# Patient Record
Sex: Female | Born: 1950 | Race: Black or African American | Hispanic: No | Marital: Married | Smoking: Never smoker
Health system: Southern US, Community
[De-identification: ages and names within clinical notes are randomized; demographics above are authoritative.]

## PROBLEM LIST (undated history)

## (undated) DIAGNOSIS — M1990 Unspecified osteoarthritis, unspecified site: Secondary | ICD-10-CM

## (undated) DIAGNOSIS — E0590 Thyrotoxicosis, unspecified without thyrotoxic crisis or storm: Secondary | ICD-10-CM

## (undated) DIAGNOSIS — I10 Essential (primary) hypertension: Secondary | ICD-10-CM

## (undated) DIAGNOSIS — E079 Disorder of thyroid, unspecified: Secondary | ICD-10-CM

## (undated) DIAGNOSIS — I5189 Other ill-defined heart diseases: Secondary | ICD-10-CM

## (undated) DIAGNOSIS — K219 Gastro-esophageal reflux disease without esophagitis: Secondary | ICD-10-CM

## (undated) HISTORY — DX: Other ill-defined heart diseases: I51.89

## (undated) HISTORY — PX: COLONOSCOPY: SHX174

## (undated) HISTORY — PX: BREAST CYST EXCISION: SHX579

## (undated) HISTORY — DX: Thyrotoxicosis, unspecified without thyrotoxic crisis or storm: E05.90

## (undated) HISTORY — DX: Essential (primary) hypertension: I10

## (undated) HISTORY — DX: Gastro-esophageal reflux disease without esophagitis: K21.9

## (undated) HISTORY — PX: BREAST BIOPSY: SHX20

## (undated) HISTORY — DX: Unspecified osteoarthritis, unspecified site: M19.90

## (undated) HISTORY — PX: OOPHORECTOMY: SHX86

## (undated) HISTORY — DX: Disorder of thyroid, unspecified: E07.9

## (undated) HISTORY — PX: ABDOMINAL HYSTERECTOMY: SHX81

---

## 2008-04-07 ENCOUNTER — Ambulatory Visit (HOSPITAL_BASED_OUTPATIENT_CLINIC_OR_DEPARTMENT_OTHER): Admission: RE | Admit: 2008-04-07 | Discharge: 2008-04-07 | Payer: Self-pay | Admitting: Internal Medicine

## 2008-08-01 ENCOUNTER — Emergency Department (HOSPITAL_BASED_OUTPATIENT_CLINIC_OR_DEPARTMENT_OTHER): Admission: EM | Admit: 2008-08-01 | Discharge: 2008-08-01 | Payer: Self-pay | Admitting: Emergency Medicine

## 2011-06-01 LAB — CULTURE, ROUTINE-ABSCESS

## 2019-07-01 ENCOUNTER — Other Ambulatory Visit: Payer: Self-pay

## 2019-07-01 ENCOUNTER — Encounter (HOSPITAL_BASED_OUTPATIENT_CLINIC_OR_DEPARTMENT_OTHER): Payer: Self-pay

## 2019-07-01 ENCOUNTER — Emergency Department (HOSPITAL_BASED_OUTPATIENT_CLINIC_OR_DEPARTMENT_OTHER): Payer: Medicare Other

## 2019-07-01 ENCOUNTER — Inpatient Hospital Stay (HOSPITAL_BASED_OUTPATIENT_CLINIC_OR_DEPARTMENT_OTHER)
Admission: EM | Admit: 2019-07-01 | Discharge: 2019-07-03 | DRG: 299 | Disposition: A | Discharge status: Home / Self Care | Payer: Medicare Other | Attending: Internal Medicine | Admitting: Internal Medicine

## 2019-07-01 DIAGNOSIS — Z6835 Body mass index (BMI) 35.0-35.9, adult: Secondary | ICD-10-CM | POA: Diagnosis not present

## 2019-07-01 DIAGNOSIS — I2510 Atherosclerotic heart disease of native coronary artery without angina pectoris: Secondary | ICD-10-CM | POA: Diagnosis present

## 2019-07-01 DIAGNOSIS — E669 Obesity, unspecified: Secondary | ICD-10-CM | POA: Diagnosis present

## 2019-07-01 DIAGNOSIS — D539 Nutritional anemia, unspecified: Secondary | ICD-10-CM | POA: Diagnosis present

## 2019-07-01 DIAGNOSIS — I2699 Other pulmonary embolism without acute cor pulmonale: Secondary | ICD-10-CM | POA: Diagnosis not present

## 2019-07-01 DIAGNOSIS — R739 Hyperglycemia, unspecified: Secondary | ICD-10-CM | POA: Diagnosis present

## 2019-07-01 DIAGNOSIS — I2693 Single subsegmental pulmonary embolism without acute cor pulmonale: Secondary | ICD-10-CM

## 2019-07-01 DIAGNOSIS — I82431 Acute embolism and thrombosis of right popliteal vein: Principal | ICD-10-CM | POA: Diagnosis present

## 2019-07-01 DIAGNOSIS — I82441 Acute embolism and thrombosis of right tibial vein: Secondary | ICD-10-CM | POA: Diagnosis present

## 2019-07-01 DIAGNOSIS — Z20828 Contact with and (suspected) exposure to other viral communicable diseases: Secondary | ICD-10-CM | POA: Diagnosis present

## 2019-07-01 DIAGNOSIS — D649 Anemia, unspecified: Secondary | ICD-10-CM | POA: Diagnosis not present

## 2019-07-01 DIAGNOSIS — I82409 Acute embolism and thrombosis of unspecified deep veins of unspecified lower extremity: Secondary | ICD-10-CM

## 2019-07-01 DIAGNOSIS — Z86718 Personal history of other venous thrombosis and embolism: Secondary | ICD-10-CM

## 2019-07-01 DIAGNOSIS — Z8249 Family history of ischemic heart disease and other diseases of the circulatory system: Secondary | ICD-10-CM

## 2019-07-01 DIAGNOSIS — Z86711 Personal history of pulmonary embolism: Secondary | ICD-10-CM

## 2019-07-01 DIAGNOSIS — E876 Hypokalemia: Secondary | ICD-10-CM | POA: Diagnosis present

## 2019-07-01 DIAGNOSIS — I5042 Chronic combined systolic (congestive) and diastolic (congestive) heart failure: Secondary | ICD-10-CM | POA: Diagnosis present

## 2019-07-01 HISTORY — DX: Acute embolism and thrombosis of unspecified deep veins of unspecified lower extremity: I82.409

## 2019-07-01 HISTORY — DX: Single subsegmental thrombotic pulmonary embolism without acute cor pulmonale: I26.93

## 2019-07-01 HISTORY — DX: Personal history of other venous thrombosis and embolism: Z86.718

## 2019-07-01 HISTORY — DX: Acute embolism and thrombosis of right popliteal vein: I82.431

## 2019-07-01 LAB — TROPONIN I (HIGH SENSITIVITY)
Troponin I (High Sensitivity): <2 ng/L (ref <18)
Troponin I (High Sensitivity): <2 ng/L (ref <18)

## 2019-07-01 LAB — CBC WITH DIFFERENTIAL/PLATELET
Basophils Absolute: 0 10*3/uL (ref 0.0–0.1)
Basophils Relative: 0 %
Eosinophils Absolute: 0.2 10*3/uL (ref 0.0–0.5)
Eosinophils Relative: 3 %
HCT: 34.2 % — ABNORMAL LOW (ref 36.0–46.0)
Hemoglobin: 11 g/dL — ABNORMAL LOW (ref 12.0–15.0)
Lymphocytes Relative: 27 %
Lymphs Abs: 2.5 10*3/uL (ref 0.7–4.0)
MCHC: 32.3 g/dL (ref 30.0–36.0)
MCV: 100.3 fL — ABNORMAL HIGH (ref 80.0–100.0)
Monocytes Absolute: 0.5 10*3/uL (ref 0.1–1.0)
Monocytes Relative: 6 %
Neutro Abs: 5.9 10*3/uL (ref 1.7–7.7)
Neutrophils Relative %: 64 %
Platelets: 310 10*3/uL (ref 150–400)
RBC: 3.41 MIL/uL — ABNORMAL LOW (ref 3.87–5.11)
RDW: 14.6 % (ref 11.5–15.5)
WBC: 9.3 10*3/uL (ref 4.0–10.5)

## 2019-07-01 LAB — BASIC METABOLIC PANEL
Anion gap: 9 (ref 5–15)
BUN: 14 mg/dL (ref 8–23)
CO2: 25 mmol/L (ref 22–32)
Calcium: 9.3 mg/dL (ref 8.9–10.3)
Chloride: 105 mmol/L (ref 98–111)
Creatinine, Ser: 0.73 mg/dL (ref 0.44–1.00)
GFR calc Af Amer: >60 mL/min (ref >60)
GFR calc non Af Amer: >60 mL/min (ref >60)
Glucose, Bld: 105 mg/dL — ABNORMAL HIGH (ref 70–99)
Potassium: 3.2 mmol/L — ABNORMAL LOW (ref 3.5–5.1)
Sodium: 139 mmol/L (ref 135–145)

## 2019-07-01 LAB — BRAIN NATRIURETIC PEPTIDE: B Natriuretic Peptide: 74.6 pg/mL (ref 0.0–100.0)

## 2019-07-01 MED ORDER — IOHEXOL 300 MG/ML  SOLN: 100.0000 mL | Freq: Once | INTRAMUSCULAR | Status: DC | PRN

## 2019-07-01 MED ORDER — HEPARIN BOLUS VIA INFUSION
5000.0000 [IU] | Freq: Once | INTRAVENOUS | Status: AC
  Administered 2019-07-01: 5000 [IU] via INTRAVENOUS

## 2019-07-01 MED ORDER — IOHEXOL 350 MG/ML SOLN
100.0000 mL | Freq: Once | INTRAVENOUS | Status: AC | PRN
  Administered 2019-07-01: 100 mL via INTRAVENOUS

## 2019-07-01 MED ORDER — RIVAROXABAN (XARELTO) VTE STARTER PACK (15 & 20 MG): ORAL_TABLET | ORAL | 0 refills | Status: DC

## 2019-07-01 MED ORDER — HEPARIN (PORCINE) 25000 UT/250ML-% IV SOLN
1200.0000 [IU]/h | INTRAVENOUS | Status: DC
  Administered 2019-07-01: 21:00:00 1200 [IU]/h via INTRAVENOUS
  Filled 2019-07-01: qty 250

## 2019-07-01 MED ORDER — RIVAROXABAN 15 MG PO TABS
15.0000 mg | ORAL_TABLET | Freq: Once | ORAL | Status: DC
  Filled 2019-07-01: qty 1

## 2019-07-01 NOTE — ED Notes (Signed)
Pt provided gown and asked to change for exam

## 2019-07-01 NOTE — Progress Notes (Signed)
ANTICOAGULATION CONSULT NOTE   Pharmacy Consult for Heparin  Indication: pulmonary embolus  No Known Allergies  Patient Measurements: Height: 5\' 2"  (157.5 cm) Weight: 195 lb (88.5 kg) IBW/kg (Calculated) : 50.1 Heparin Dosing Weight: 70.6 kg  Vital Signs: Temp: 98.3 F (36.8 C) (11/04 1540) Temp Source: Oral (11/04 1540) BP: 137/81 (11/04 1838) Pulse Rate: 95 (11/04 1838)  Labs: Recent Labs    07/01/19 1834  HGB 11.0*  HCT 34.2*  PLT 310  CREATININE 0.73  TROPONINIHS <2    Estimated Creatinine Clearance: 68.6 mL/min (by C-G formula based on SCr of 0.73 mg/dL).   Medical History: History reviewed. No pertinent past medical history.  Medications:  Scheduled:   Assessment: Patient os a 58 yof that presented to the ED with calf pain and denied SOB and CP. The patient was found to have a PE and pharmacy has been consulted to dose heparin for this patient. Patient denies any previous DVT/PA and does not appear to be on any blood thinners at this time.   Goal of Therapy:  Heparin level 0.3-0.7 units/ml Monitor platelets by anticoagulation protocol: Yes   Plan:  - Heparin 5000 unit IV x 1 dose  - Heparin drip @ 1200 units/hr - Heparin level in 6 hours  - Monitor for s/s of bleeding and CBC while on heparin drip  Duanne Limerick PharmD, BCPS 07/01/2019,8:52 PM

## 2019-07-01 NOTE — H&P (Signed)
TRH H&P    Patient Demographics:    Ruth Jensen, is a 68 y.o. female  MRN: [id number]  DOB - September 30, 1950  Admit Date - 07/01/2019  Referring MD/NP/PA:  Tyrone Nine  Outpatient Primary MD for the patient is Patient, No Pcp Per  Patient coming from:  home  Chief complaint-  Right leg pain   HPI:    Ruth Jensen  is a 68 y.o. female, woke up Sunday and her calf on her right leg hurt, and used heating pad and didn't seem to help much and because not improving and thus went to ed.  Pt denies weight loss, cancer, long trip, or family hx of blood clots  In Ed, pt noted to be tachycardic, .  T 98.3, P 105, R 18, Bp 153/81  Pox 100% on RA Wt 88.5kg  Ultrasound IMPRESSION: Sonographic survey of the right lower extremity positive for acute DVT of the popliteal vein extending into the posterior tibial vein.  CTA chest IMPRESSION: CT is positive for acute pulmonary emboli of the left lower lobe segmental and subsegmental vasculature.  Coronary artery disease.  Wbc 9.3, Hgb 11.0, Plt 310 Na 139, K 3.2, Bun 14, Creatinine 0.73 Trop <2 BNP 74.6,   Pt will be admitted for RLE DVT and acute PE   Review of systems:    In addition to the HPI above,  No Fever-chills, No Headache, No changes with Vision or hearing, No problems swallowing food or Liquids, No Chest pain, Cough or Shortness of Breath, No Abdominal pain, No Nausea or Vomiting, bowel movements are regular, No Blood in stool or Urine, No dysuria, No new skin rashes or bruises, No new joints pains-aches,  No new weakness, tingling, numbness in any extremity, No recent weight gain or loss, No polyuria, polydypsia or polyphagia, No significant Mental Stressors.  All other systems reviewed and are negative.    Past History of the following :    Past Medical History:  Diagnosis Date   DVT (deep venous thrombosis) ([place]) 07/01/2019   Pulmonary  embolism ([place]) 07/02/2019      Past Surgical History:  Procedure Laterality Date   ABDOMINAL HYSTERECTOMY        Social History:      Social History   Tobacco Use   Smoking status: Never Smoker   Smokeless tobacco: Never Used  Substance Use Topics   Alcohol use: Not Currently       Family History :     Family History  Problem Relation Age of Onset   Hypertension Mother    Hypertension Father        Home Medications:   Prior to Admission medications   Medication Sig Start Date End Date Taking? Authorizing Provider  Rivaroxaban 15 & 20 MG TBPK Follow package directions: Take one 15mg  tablet by mouth twice a day. On day 22, switch to one 20mg  tablet once a day. Take with food. 07/01/19   Deno Etienne, DO     Allergies:    No Known Allergies   Physical Exam:  Vitals  Blood pressure (!) 156/92, pulse 96, temperature 98.7 F (37.1 C), resp. rate 18, height 5\' 2"  (1.575 m), weight 87.7 kg, SpO2 100 %.  1.  General: axoxo3  2. Psychiatric: euthymic  3. Neurologic: Cn 2-12 intact, reflexes 2+ symmetric, diffuse with no clonus, motor 5/5 in all 4 ext  4. HEENMT:  Anicteric, pupils 1.24mm symmetric, direct, consensual, near intact Neck: no jvd  5. Respiratory : CTAB  6. Cardiovascular : rrr s1, s2, no m/g/r,  No loud p2  7. Gastrointestinal:  Abd: soft, nt, nd, +bs  8. Skin:  Ext: no c/c,  Trace edema, right lower ext, + homans  9.Musculoskeletal:  Good ROM    Data Review:    CBC Recent Labs  Lab 07/01/19 1834  WBC 9.3  HGB 11.0*  HCT 34.2*  PLT 310  MCV 100.3*  MCHC 32.3  RDW 14.6  LYMPHSABS 2.5  MONOABS 0.5  EOSABS 0.2  BASOSABS 0.0   ------------------------------------------------------------------------------------------------------------------  Results for orders placed or performed during the hospital encounter of 07/01/19 (from the past 48 hour(s))  CBC with Differential     Status: Abnormal   Collection Time:  07/01/19  6:34 PM  Result Value Ref Range   WBC 9.3 4.0 - 10.5 K/uL   RBC 3.41 (L) 3.87 - 5.11 MIL/uL   Hemoglobin 11.0 (L) 12.0 - 15.0 g/dL   HCT 34.2 (L) 36.0 - 46.0 %   MCV 100.3 (H) 80.0 - 100.0 fL   MCHC 32.3 30.0 - 36.0 g/dL   RDW 14.6 11.5 - 15.5 %   Platelets 310 150 - 400 K/uL   Neutrophils Relative % 64 %   Neutro Abs 5.9 1.7 - 7.7 K/uL   Lymphocytes Relative 27 %   Lymphs Abs 2.5 0.7 - 4.0 K/uL   Monocytes Relative 6 %   Monocytes Absolute 0.5 0.1 - 1.0 K/uL   Eosinophils Relative 3 %   Eosinophils Absolute 0.2 0.0 - 0.5 K/uL   Basophils Relative 0 %   Basophils Absolute 0.0 0.0 - 0.1 K/uL    Comment: Performed at [facility], [address] [place], [place] [id number]  Basic metabolic panel     Status: Abnormal   Collection Time: 07/01/19  6:34 PM  Result Value Ref Range   Sodium 139 135 - 145 mmol/L   Potassium 3.2 (L) 3.5 - 5.1 mmol/L   Chloride 105 98 - 111 mmol/L   CO2 25 22 - 32 mmol/L   Glucose, Bld 105 (H) 70 - 99 mg/dL   BUN 14 8 - 23 mg/dL   Creatinine, Ser 0.73 0.44 - 1.00 mg/dL   Calcium 9.3 8.9 - 10.3 mg/dL   GFR calc non Af Amer >60 >60 mL/min   GFR calc Af Amer >60 >60 mL/min   Anion gap 9 5 - 15    Comment: Performed at [facility], [address] [place], [place] [address]  Troponin I (High Sensitivity)     Status: None   Collection Time: 07/01/19  6:34 PM  Result Value Ref Range   Troponin I (High Sensitivity) <2 <18 ng/L    Comment: (NOTE) Elevated high sensitivity troponin I (hsTnI) values and significant  changes across serial measurements may suggest ACS but many other  chronic and acute conditions are known to elevate hsTnI results.  Refer to the Links section for chest pain algorithms and additional  guidance. Performed at [facility], [address] [place], [place] [address]   Brain natriuretic peptide     Status: None   Collection Time: 07/01/19  6:34 PM  Result Value Ref Range   B  Natriuretic Peptide 74.6 0.0 - 100.0 pg/mL    Comment: Performed at [facility], [address] [place], [place] [address]  Troponin I (High Sensitivity)     Status: None   Collection Time: 07/01/19  8:20 PM  Result Value Ref Range   Troponin I (High Sensitivity) <2 <18 ng/L    Comment: (NOTE) Elevated high sensitivity troponin I (hsTnI) values and significant  changes across serial measurements may suggest ACS but many other  chronic and acute conditions are known to elevate hsTnI results.  Refer to the Links section for chest pain algorithms and additional  guidance. Performed at [facility], [address] [place], [place] [address]     Chemistries  Recent Labs  Lab 07/01/19 1834  NA 139  K 3.2*  CL 105  CO2 25  GLUCOSE 105*  BUN 14  CREATININE 0.73  CALCIUM 9.3   ------------------------------------------------------------------------------------------------------------------  ------------------------------------------------------------------------------------------------------------------ GFR: Estimated Creatinine Clearance: 68.2 mL/min (by C-G formula based on SCr of 0.73 mg/dL). Liver Function Tests: No results for input(s): AST, ALT, ALKPHOS, BILITOT, PROT, ALBUMIN in the last 168 hours. No results for input(s): LIPASE, AMYLASE in the last 168 hours. No results for input(s): AMMONIA in the last 168 hours. Coagulation Profile: No results for input(s): INR, PROTIME in the last 168 hours. Cardiac Enzymes: No results for input(s): CKTOTAL, CKMB, CKMBINDEX, TROPONINI in the last 168 hours. BNP (last 3 results) No results for input(s): PROBNP in the last 8760 hours. HbA1C: No results for input(s): HGBA1C in the last 72 hours. CBG: No results for input(s): GLUCAP in the last 168 hours. Lipid Profile: No results for input(s): CHOL, HDL, LDLCALC, TRIG, CHOLHDL, LDLDIRECT in the last 72 hours. Thyroid Function Tests: No results for input(s):  TSH, T4TOTAL, FREET4, T3FREE, THYROIDAB in the last 72 hours. Anemia Panel: No results for input(s): VITAMINB12, FOLATE, FERRITIN, TIBC, IRON, RETICCTPCT in the last 72 hours.  --------------------------------------------------------------------------------------------------------------- Urine analysis: No results found for: COLORURINE, APPEARANCEUR, LABSPEC, PHURINE, GLUCOSEU, HGBUR, BILIRUBINUR, KETONESUR, PROTEINUR, UROBILINOGEN, NITRITE, LEUKOCYTESUR    Imaging Results:    Ct Angio Chest Pe W And/or Wo Contrast  Result Date: 07/01/2019 CLINICAL DATA:  68 year old female with a history ofa leg pain and concern for pulmonary embolism EXAM: CT ANGIOGRAPHY CHEST WITH CONTRAST TECHNIQUE: Multidetector CT imaging of the chest was performed using the standard protocol during bolus administration of intravenous contrast. Multiplanar CT image reconstructions and MIPs were obtained to evaluate the vascular anatomy. CONTRAST:  175mL OMNIPAQUE IOHEXOL 350 MG/ML SOLN COMPARISON:  None. FINDINGS: Cardiovascular: Heart: No cardiomegaly. No pericardial fluid/thickening. Calcifications of the right coronary artery. Right ventricle left ventricle ratio below 1, estimated 0.94 Aorta: Unremarkable course, caliber, contour of the thoracic aorta. No aneurysm or dissection flap. No periaortic fluid. Pulmonary arteries: Central filling defects in the segmental and subsegmental left-sided pulmonary arteries of the left lower lobe. No filling defects identified on the right or within the left upper lobe. Mediastinum/Nodes: No mediastinal adenopathy. Unremarkable appearance of the thoracic esophagus. Unremarkable thoracic inlet. Lungs/Pleura: Central airways are clear. No pleural effusion. No confluent airspace disease. No pneumothorax. Upper Abdomen: No acute. Musculoskeletal: No acute displaced fracture. Degenerative changes of the spine. Review of the MIP images confirms the above findings. IMPRESSION: CT is positive for  acute pulmonary  emboli of the left lower lobe segmental and subsegmental vasculature. Coronary artery disease. These results were called by telephone at the time of interpretation on 07/01/2019 at 8:28 pm to provider DAN FLOYD , who verbally acknowledged these results. Electronically Signed   By: Corrie Mckusick D.O.   On: 07/01/2019 20:34   US Venous Img Lower Unilateral Right  Result Date: 07/01/2019 CLINICAL DATA:  68 year old female with a history of right calf pain EXAM: RIGHT LOWER EXTREMITY VENOUS DOPPLER ULTRASOUND TECHNIQUE: Gray-scale sonography with graded compression, as well as color Doppler and duplex ultrasound were performed to evaluate the lower extremity deep venous systems from the level of the common femoral vein and including the common femoral, femoral, profunda femoral, popliteal and calf veins including the posterior tibial, peroneal and gastrocnemius veins when visible. The superficial great saphenous vein was also interrogated. Spectral Doppler was utilized to evaluate flow at rest and with distal augmentation maneuvers in the common femoral, femoral and popliteal veins. COMPARISON:  None. FINDINGS: Contralateral Common Femoral Vein: Respiratory phasicity is normal and symmetric with the symptomatic side. No evidence of thrombus. Normal compressibility. Common Femoral Vein: No evidence of thrombus. Normal compressibility, respiratory phasicity and response to augmentation. Saphenofemoral Junction: No evidence of thrombus. Normal compressibility and flow on color Doppler imaging. Profunda Femoral Vein: No evidence of thrombus. Normal compressibility and flow on color Doppler imaging. Femoral Vein: No evidence of thrombus. Normal compressibility, respiratory phasicity and response to augmentation. Popliteal Vein: Noncompressible popliteal vein with occlusive thrombus extending into the tibioperoneal vein. Calf Veins: Occlusive thrombus of the posterior tibial vein extending towards the  popliteal vein. Peroneal vein not visualized. Superficial Great Saphenous Vein: No evidence of thrombus. Normal compressibility and flow on color Doppler imaging. Other Findings:  None. IMPRESSION: Sonographic survey of the right lower extremity positive for acute DVT of the popliteal vein extending into the posterior tibial vein. Electronically Signed   By: Corrie Mckusick D.O.   On: 07/01/2019 18:17       Assessment & Plan:    Principal Problem:   Single subsegmental pulmonary embolism without acute cor pulmonale (HCC) Active Problems:   Acute deep vein thrombosis (DVT) of popliteal vein of right lower extremity (HCC)   Acute pulmonary embolism (HCC)  Acute Pulmonary embolism Acute RLE DVT (popliteal vein) Check trop I Check cardiac echo  Heparin gtt Consider transition to oral agent in am     DVT Prophylaxis-   Heparin gtt  AM Labs Ordered, also please review Full Orders  Family Communication: Admission, patients condition and plan of care including tests being ordered have been discussed with the patient  who indicate understanding and agree with the plan and Code Status.  Code Status:  FULL CODE per patient  Admission status: Observation: Based on patients clinical presentation and evaluation of above clinical data, I have made determination that patient meets criteria at this time.  Time spent in minutes : 55 minutes   Jani Gravel M.D on 07/02/2019 at 12:55 AM

## 2019-07-01 NOTE — ED Triage Notes (Signed)
Pt reports right lower leg/calf pain for 3-4 days, worse with weight bearing.

## 2019-07-01 NOTE — ED Provider Notes (Signed)
[facility] DEPARTMENT Provider Note   CSN: [id number] Arrival date & time: 07/01/19  1527     History   Chief Complaint Chief Complaint  Patient presents with   Leg Pain    HPI Ruth Jensen is a 68 y.o. female.     68 yo F with a chief complaints of right calf pain.  Going on for about 4 days now.  Denies injury.  States that the pain is sharp and radiates to the anterior aspect of the leg.  Worse with bearing weight.  No chest pain or shortness of breath.  No recent travel immobilization and surgery.  She denies history of prior PE or DVT.  The history is provided by the patient.  Leg Pain Location:  Leg Time since incident:  4 days Injury: no   Leg location:  R lower leg Pain details:    Quality:  Aching   Radiates to:  Does not radiate   Severity:  Moderate   Onset quality:  Gradual   Duration:  4 days   Timing:  Constant   Progression:  Worsening Chronicity:  New Prior injury to area:  No Relieved by:  Nothing Worsened by:  Bearing weight (palpation) Ineffective treatments:  Heat (aspirin) Associated symptoms: no fatigue and no fever     History reviewed. No pertinent past medical history.  Patient Active Problem List   Diagnosis Date Noted   Acute pulmonary embolism ([place]) 07/01/2019    History reviewed. No pertinent surgical history.   OB History   No obstetric history on file.      Home Medications    Prior to Admission medications   Medication Sig Start Date End Date Taking? Authorizing Provider  Rivaroxaban 15 & 20 MG TBPK Follow package directions: Take one 15mg  tablet by mouth twice a day. On day 22, switch to one 20mg  tablet once a day. Take with food. 07/01/19   Deno Etienne, DO    Family History History reviewed. No pertinent family history.  Social History Social History   Tobacco Use   Smoking status: Never Smoker   Smokeless tobacco: Never Used  Substance Use Topics   Alcohol use: Not on file   Drug  use: Not on file     Allergies   Patient has no known allergies.   Review of Systems Review of Systems  Constitutional: Negative for chills, fatigue and fever.  HENT: Negative for congestion and rhinorrhea.   Eyes: Negative for redness and visual disturbance.  Respiratory: Negative for shortness of breath and wheezing.   Cardiovascular: Negative for chest pain and palpitations.  Gastrointestinal: Negative for nausea and vomiting.  Genitourinary: Negative for dysuria and urgency.  Musculoskeletal: Positive for myalgias. Negative for arthralgias.  Skin: Negative for pallor and wound.  Neurological: Negative for dizziness and headaches.     Physical Exam Updated Vital Signs BP (!) 141/83    Pulse 96    Temp 98.3 F (36.8 C) (Oral)    Resp 19    Ht 5\' 2"  (1.575 m)    Wt 88.5 kg    SpO2 100%    BMI 35.67 kg/m   Physical Exam Vitals signs and nursing note reviewed.  Constitutional:      General: She is not in acute distress.    Appearance: She is well-developed. She is not diaphoretic.  HENT:     Head: Normocephalic and atraumatic.  Eyes:     Pupils: Pupils are equal, round, and reactive to  light.  Neck:     Musculoskeletal: Normal range of motion and neck supple.  Cardiovascular:     Rate and Rhythm: Normal rate and regular rhythm.     Heart sounds: No murmur. No friction rub. No gallop.   Pulmonary:     Effort: Pulmonary effort is normal.     Breath sounds: No wheezing or rales.  Abdominal:     General: There is no distension.     Palpations: Abdomen is soft.     Tenderness: There is no abdominal tenderness.  Musculoskeletal:        General: No tenderness.     Comments: Pulse motor and sensation are intact to the right lower extremity.  She has some mild tenderness with palpation of the calf.  She has some mild tenderness to the soft tissues just lateral to the tibia.  No obvious edema.  Seems symmetric to the other side.  Skin:    General: Skin is warm and dry.    Neurological:     Mental Status: She is alert and oriented to person, place, and time.  Psychiatric:        Behavior: Behavior normal.      ED Treatments / Results  Labs (all labs ordered are listed, but only abnormal results are displayed) Labs Reviewed  CBC WITH DIFFERENTIAL/PLATELET - Abnormal; Notable for the following components:      Result Value   RBC 3.41 (*)    Hemoglobin 11.0 (*)    HCT 34.2 (*)    MCV 100.3 (*)    All other components within normal limits  BASIC METABOLIC PANEL - Abnormal; Notable for the following components:   Potassium 3.2 (*)    Glucose, Bld 105 (*)    All other components within normal limits  SARS CORONAVIRUS 2 (TAT 6-24 HRS)  BRAIN NATRIURETIC PEPTIDE  HEPARIN LEVEL (UNFRACTIONATED)  CBC  TROPONIN I (HIGH SENSITIVITY)  TROPONIN I (HIGH SENSITIVITY)    EKG EKG Interpretation  Date/Time:  Wednesday July 01 2019 18:36:56 EST Ventricular Rate:  96 PR Interval:    QRS Duration: 97 QT Interval:  345 QTC Calculation: 436 R Axis:   -8 Text Interpretation: Sinus rhythm Ventricular trigeminy Abnormal R-wave progression, early transition LVH with secondary repolarization abnormality No old tracing to compare Confirmed by Deno Etienne [phone]) on 07/01/2019 6:44:31 PM   Radiology Ct Angio Chest Pe W And/or Wo Contrast  Result Date: 07/01/2019 CLINICAL DATA:  68 year old female with a history ofa leg pain and concern for pulmonary embolism EXAM: CT ANGIOGRAPHY CHEST WITH CONTRAST TECHNIQUE: Multidetector CT imaging of the chest was performed using the standard protocol during bolus administration of intravenous contrast. Multiplanar CT image reconstructions and MIPs were obtained to evaluate the vascular anatomy. CONTRAST:  158mL OMNIPAQUE IOHEXOL 350 MG/ML SOLN COMPARISON:  None. FINDINGS: Cardiovascular: Heart: No cardiomegaly. No pericardial fluid/thickening. Calcifications of the right coronary artery. Right ventricle left ventricle ratio  below 1, estimated 0.94 Aorta: Unremarkable course, caliber, contour of the thoracic aorta. No aneurysm or dissection flap. No periaortic fluid. Pulmonary arteries: Central filling defects in the segmental and subsegmental left-sided pulmonary arteries of the left lower lobe. No filling defects identified on the right or within the left upper lobe. Mediastinum/Nodes: No mediastinal adenopathy. Unremarkable appearance of the thoracic esophagus. Unremarkable thoracic inlet. Lungs/Pleura: Central airways are clear. No pleural effusion. No confluent airspace disease. No pneumothorax. Upper Abdomen: No acute. Musculoskeletal: No acute displaced fracture. Degenerative changes of the spine. Review of the MIP  images confirms the above findings. IMPRESSION: CT is positive for acute pulmonary emboli of the left lower lobe segmental and subsegmental vasculature. Coronary artery disease. These results were called by telephone at the time of interpretation on 07/01/2019 at 8:28 pm to provider Ardith Lewman , who verbally acknowledged these results. Electronically Signed   By: Corrie Mckusick D.O.   On: 07/01/2019 20:34   US Venous Img Lower Unilateral Right  Result Date: 07/01/2019 CLINICAL DATA:  68 year old female with a history of right calf pain EXAM: RIGHT LOWER EXTREMITY VENOUS DOPPLER ULTRASOUND TECHNIQUE: Gray-scale sonography with graded compression, as well as color Doppler and duplex ultrasound were performed to evaluate the lower extremity deep venous systems from the level of the common femoral vein and including the common femoral, femoral, profunda femoral, popliteal and calf veins including the posterior tibial, peroneal and gastrocnemius veins when visible. The superficial great saphenous vein was also interrogated. Spectral Doppler was utilized to evaluate flow at rest and with distal augmentation maneuvers in the common femoral, femoral and popliteal veins. COMPARISON:  None. FINDINGS: Contralateral Common  Femoral Vein: Respiratory phasicity is normal and symmetric with the symptomatic side. No evidence of thrombus. Normal compressibility. Common Femoral Vein: No evidence of thrombus. Normal compressibility, respiratory phasicity and response to augmentation. Saphenofemoral Junction: No evidence of thrombus. Normal compressibility and flow on color Doppler imaging. Profunda Femoral Vein: No evidence of thrombus. Normal compressibility and flow on color Doppler imaging. Femoral Vein: No evidence of thrombus. Normal compressibility, respiratory phasicity and response to augmentation. Popliteal Vein: Noncompressible popliteal vein with occlusive thrombus extending into the tibioperoneal vein. Calf Veins: Occlusive thrombus of the posterior tibial vein extending towards the popliteal vein. Peroneal vein not visualized. Superficial Great Saphenous Vein: No evidence of thrombus. Normal compressibility and flow on color Doppler imaging. Other Findings:  None. IMPRESSION: Sonographic survey of the right lower extremity positive for acute DVT of the popliteal vein extending into the posterior tibial vein. Electronically Signed   By: Corrie Mckusick D.O.   On: 07/01/2019 18:17    Procedures Procedures (including critical care time)  Medications Ordered in ED Medications  heparin ADULT infusion 100 units/mL (25000 units/278mL sodium chloride 0.45%) (1,200 Units/hr Intravenous New Bag/Given 07/01/19 2128)  iohexol (OMNIPAQUE) 350 MG/ML injection 100 mL (100 mLs Intravenous Contrast Given 07/01/19 1950)  heparin bolus via infusion 5,000 Units (5,000 Units Intravenous Bolus from Bag 07/01/19 2130)     Initial Impression / Assessment and Plan / ED Course  I have reviewed the triage vital signs and the nursing notes.  Pertinent labs & imaging results that were available during my care of the patient were reviewed by me and considered in my medical decision making (see chart for details).        68 yo F with a chief  complaint of right calf pain.  Will obtain ultrasound to evaluate for DVT.   The patient has an acute DVT on ultrasound.  Patient was noted to be tachycardic on arrival.  She denies any chest pain or shortness of breath though has been mildly fatigued for the past few days.  I will work her up for a pulmonary embolism.  The patient's CT scan is positive for pulmonary embolism.  This is subsegmental without any significant heart strain is viewed on CT scan.  The patient continues to feel well.  She is borderline tachycardic and becomes tachycardic with minimal movement.  My initial plan after finding her through the passing score was to try  and discharge her home though I found the patient has no follow-up as an outpatient.  I think in this patient population it would be better to be more conservative and so discussed with the hospitalist about possible admission.  CRITICAL CARE Performed by: Cecilio Asper   Total critical care time: 35 minutes  Critical care time was exclusive of separately billable procedures and treating other patients.  Critical care was necessary to treat or prevent imminent or life-threatening deterioration.  Critical care was time spent personally by me on the following activities: development of treatment plan with patient and/or surrogate as well as nursing, discussions with consultants, evaluation of patient's response to treatment, examination of patient, obtaining history from patient or surrogate, ordering and performing treatments and interventions, ordering and review of laboratory studies, ordering and review of radiographic studies, pulse oximetry and re-evaluation of patient's condition.  The patients results and plan were reviewed and discussed.   Any x-rays performed were independently reviewed by myself.   Differential diagnosis were considered with the presenting HPI.  Medications  heparin ADULT infusion 100 units/mL (25000 units/29mL sodium  chloride 0.45%) (1,200 Units/hr Intravenous New Bag/Given 07/01/19 2128)  iohexol (OMNIPAQUE) 350 MG/ML injection 100 mL (100 mLs Intravenous Contrast Given 07/01/19 1950)  heparin bolus via infusion 5,000 Units (5,000 Units Intravenous Bolus from Bag 07/01/19 2130)    Vitals:   07/01/19 1537 07/01/19 1540 07/01/19 1838 07/01/19 2052  BP:  (!) 153/81 137/81 (!) 141/83  Pulse:  (!) 105 95 96  Resp:  18 15 19   Temp:  98.3 F (36.8 C)    TempSrc:  Oral    SpO2:  100% 100% 100%  Weight: 88.5 kg     Height: 5\' 2"  (1.575 m)       Final diagnoses:  Single subsegmental pulmonary embolism without acute cor pulmonale (HCC)  Acute deep vein thrombosis (DVT) of popliteal vein of right lower extremity (HCC)    Admission/ observation were discussed with the admitting physician, patient and/or family and they are comfortable with the plan.   Final Clinical Impressions(s) / ED Diagnoses   Final diagnoses:  Single subsegmental pulmonary embolism without acute cor pulmonale (HCC)  Acute deep vein thrombosis (DVT) of popliteal vein of right lower extremity [facility])    ED Discharge Orders         Ordered    Rivaroxaban 15 & 20 MG TBPK     07/01/19 2040           Deno Etienne, DO 07/01/19 2248

## 2019-07-01 NOTE — ED Notes (Signed)
Patient transported to Ultrasound 

## 2019-07-02 ENCOUNTER — Inpatient Hospital Stay (HOSPITAL_COMMUNITY): Payer: Medicare Other

## 2019-07-02 ENCOUNTER — Encounter (HOSPITAL_COMMUNITY): Payer: Self-pay | Admitting: Internal Medicine

## 2019-07-02 DIAGNOSIS — Z86711 Personal history of pulmonary embolism: Secondary | ICD-10-CM

## 2019-07-02 DIAGNOSIS — I2693 Single subsegmental pulmonary embolism without acute cor pulmonale: Secondary | ICD-10-CM

## 2019-07-02 DIAGNOSIS — I2699 Other pulmonary embolism without acute cor pulmonale: Secondary | ICD-10-CM

## 2019-07-02 DIAGNOSIS — D649 Anemia, unspecified: Secondary | ICD-10-CM

## 2019-07-02 HISTORY — DX: Other pulmonary embolism without acute cor pulmonale: I26.99

## 2019-07-02 HISTORY — DX: Personal history of pulmonary embolism: Z86.711

## 2019-07-02 LAB — CBC
HCT: 30.5 % — ABNORMAL LOW (ref 36.0–46.0)
Hemoglobin: 9.8 g/dL — ABNORMAL LOW (ref 12.0–15.0)
MCH: 32 pg (ref 26.0–34.0)
MCHC: 32.1 g/dL (ref 30.0–36.0)
MCV: 99.7 fL (ref 80.0–100.0)
Platelets: 274 10*3/uL (ref 150–400)
RBC: 3.06 MIL/uL — ABNORMAL LOW (ref 3.87–5.11)
RDW: 14.3 % (ref 11.5–15.5)
WBC: 8.3 10*3/uL (ref 4.0–10.5)
nRBC: 0 % (ref 0.0–0.2)

## 2019-07-02 LAB — ECHOCARDIOGRAM COMPLETE
Height: 62 in
Weight: 3092.8 oz

## 2019-07-02 LAB — COMPREHENSIVE METABOLIC PANEL
ALT: 8 U/L (ref 0–44)
AST: 12 U/L — ABNORMAL LOW (ref 15–41)
Albumin: 3.3 g/dL — ABNORMAL LOW (ref 3.5–5.0)
Alkaline Phosphatase: 83 U/L (ref 38–126)
Anion gap: 8 (ref 5–15)
BUN: 12 mg/dL (ref 8–23)
CO2: 24 mmol/L (ref 22–32)
Calcium: 8.8 mg/dL — ABNORMAL LOW (ref 8.9–10.3)
Chloride: 107 mmol/L (ref 98–111)
Creatinine, Ser: 0.56 mg/dL (ref 0.44–1.00)
GFR calc Af Amer: >60 mL/min (ref >60)
GFR calc non Af Amer: >60 mL/min (ref >60)
Glucose, Bld: 139 mg/dL — ABNORMAL HIGH (ref 70–99)
Potassium: 3.2 mmol/L — ABNORMAL LOW (ref 3.5–5.1)
Sodium: 139 mmol/L (ref 135–145)
Total Bilirubin: 1.1 mg/dL (ref 0.3–1.2)
Total Protein: 6.9 g/dL (ref 6.5–8.1)

## 2019-07-02 LAB — HEPARIN LEVEL (UNFRACTIONATED)
Heparin Unfractionated: 0.8 IU/mL — ABNORMAL HIGH (ref 0.30–0.70)
Heparin Unfractionated: 0.16 IU/mL — ABNORMAL LOW (ref 0.30–0.70)

## 2019-07-02 LAB — SARS CORONAVIRUS 2 (TAT 6-24 HRS): SARS Coronavirus 2: NEGATIVE

## 2019-07-02 LAB — HIV ANTIBODY (ROUTINE TESTING W REFLEX): HIV Screen 4th Generation wRfx: NONREACTIVE

## 2019-07-02 LAB — PHOSPHORUS: Phosphorus: 3.1 mg/dL (ref 2.5–4.6)

## 2019-07-02 LAB — TROPONIN I (HIGH SENSITIVITY): Troponin I (High Sensitivity): <2 ng/L (ref <18)

## 2019-07-02 LAB — MAGNESIUM: Magnesium: 2.1 mg/dL (ref 1.7–2.4)

## 2019-07-02 LAB — MRSA PCR SCREENING: MRSA by PCR: NEGATIVE

## 2019-07-02 MED ORDER — ACETAMINOPHEN 650 MG RE SUPP: 650.0000 mg | Freq: Four times a day (QID) | RECTAL | Status: DC | PRN

## 2019-07-02 MED ORDER — ACETAMINOPHEN 325 MG PO TABS: 650.0000 mg | ORAL_TABLET | Freq: Four times a day (QID) | ORAL | Status: DC | PRN

## 2019-07-02 MED ORDER — SODIUM CHLORIDE 0.9 % IV SOLN: 250.0000 mL | INTRAVENOUS | Status: DC | PRN

## 2019-07-02 MED ORDER — SODIUM CHLORIDE 0.9% FLUSH: 3.0000 mL | INTRAVENOUS | Status: DC | PRN

## 2019-07-02 MED ORDER — SODIUM CHLORIDE 0.9% FLUSH: 3.0000 mL | Freq: Two times a day (BID) | INTRAVENOUS | Status: DC

## 2019-07-02 MED ORDER — HEPARIN BOLUS VIA INFUSION
2000.0000 [IU] | Freq: Once | INTRAVENOUS | Status: AC
  Administered 2019-07-02: 2000 [IU] via INTRAVENOUS
  Filled 2019-07-02: qty 2000

## 2019-07-02 MED ORDER — POTASSIUM CHLORIDE CRYS ER 20 MEQ PO TBCR
20.0000 meq | EXTENDED_RELEASE_TABLET | Freq: Once | ORAL | Status: AC
  Administered 2019-07-02: 20 meq via ORAL
  Filled 2019-07-02: qty 1

## 2019-07-02 MED ORDER — ADULT MULTIVITAMIN W/MINERALS CH
1.0000 | ORAL_TABLET | Freq: Every day | ORAL | Status: DC
  Administered 2019-07-02 – 2019-07-03 (×2): 1 via ORAL
  Filled 2019-07-02 (×2): qty 1

## 2019-07-02 MED ORDER — POTASSIUM CHLORIDE 10 MEQ/100ML IV SOLN: 10.0000 meq | INTRAVENOUS | Status: DC

## 2019-07-02 MED ORDER — ONDANSETRON 4 MG PO TBDP
4.0000 mg | ORAL_TABLET | Freq: Three times a day (TID) | ORAL | Status: DC | PRN
  Administered 2019-07-02: 4 mg via ORAL
  Filled 2019-07-02: qty 1

## 2019-07-02 MED ORDER — POTASSIUM CHLORIDE CRYS ER 20 MEQ PO TBCR
40.0000 meq | EXTENDED_RELEASE_TABLET | Freq: Two times a day (BID) | ORAL | Status: AC
  Administered 2019-07-02 (×2): 40 meq via ORAL
  Filled 2019-07-02 (×2): qty 2

## 2019-07-02 MED ORDER — HEPARIN (PORCINE) 25000 UT/250ML-% IV SOLN: 1150.0000 [IU]/h | INTRAVENOUS | Status: DC

## 2019-07-02 MED ORDER — HEPARIN (PORCINE) 25000 UT/250ML-% IV SOLN
1100.0000 [IU]/h | INTRAVENOUS | Status: DC
  Administered 2019-07-02: 1100 [IU]/h via INTRAVENOUS
  Filled 2019-07-02: qty 250

## 2019-07-02 NOTE — TOC Benefit Eligibility Note (Signed)
Transition of Care [facility]) Benefit Eligibility Note    Patient Details  Name: Ruth Jensen MRN: [id number] Date of Birth: 11/20/1950   Medication/Dose: Alveda Reasons 20mg  po qd Apixaban 5mg   Covered?: (no Rx coverage on file per Joanie Coddington)     Prescription Coverage Preferred Pharmacy: Patient uses [facility] with Person/Company/Phone Number:: Evelyn/ [facility] [phone]  Co-Pay: [phone]             Kerin Salen Phone Number: 07/02/2019, 10:46 AM

## 2019-07-02 NOTE — Progress Notes (Signed)
PROGRESS NOTE    Ruth Jensen  [id number] DOB: 1950-11-19 DOA: 07/01/2019 PCP: Patient, No Pcp Per   Brief Narrative:  HPI per Dr. Jani Gravel on 07/01/2019   Ruth Jensen  is a 68 y.o. female, woke up Sunday and her calf on her right leg hurt, and used heating pad and didn't seem to help much and because not improving and thus went to ed.  Pt denies weight loss, cancer, long trip, or family hx of blood clots  In Ed, pt noted to be tachycardic, .  T 98.3, P 105, R 18, Bp 153/81  Pox 100% on RA Wt 88.5kg  Ultrasound IMPRESSION: Sonographic survey of the right lower extremity positive for acute DVT of the popliteal vein extending into the posterior tibial vein.  CTA chest IMPRESSION: CT is positive for acute pulmonary emboli of the left lower lobe segmental and subsegmental vasculature.  Coronary artery disease.  Wbc 9.3, Hgb 11.0, Plt 310 Na 139, K 3.2, Bun 14, Creatinine 0.73 Trop <2 BNP 74.6,   Pt will be admitted for RLE DVT and acute PE  **Interim History If that she became more dyspneic the last 4 days.  Has not had any long trips or any traumatic injuries.  Was concerned about her right leg swelling and states that she works in a [employer].  Has not ambulated yet and has not had her echocardiogram done yet as ordered.  Assessment & Plan:   Principal Problem:   Single subsegmental pulmonary embolism without acute cor pulmonale (HCC) Active Problems:   Acute deep vein thrombosis (DVT) of popliteal vein of right lower extremity (HCC)   Acute pulmonary embolism (HCC)  Acute Right Leg Popliteal Vein and Posterior Tibial Vein DVT with Concomitant Acute PE, poA -Presented with Worsening Dyspnea and Right Leg Swelling -Vascular Duplex of the Right LE showed "Sonographic survey of the right lower extremity positive for acute DVT of the popliteal vein extending into the posterior tibial vein." -CTA PE showed "CT is positive for acute pulmonary emboli of the  left lower lobe segmental and subsegmental vasculature. Coronary artery disease." -Checked HS Troponin and was flat at <2 x3 -Check ECHOCardiogram and ordered and pending to be done -Started on a Heparin gtt and will transition to a NOAC after 24 hours of AC with Heparin gtt -PESI Score was Class 2 -Spoke with Oncology Dr. Lindi Adie who will arrange for an outpatient follow up for Hypercoaguable workup -Discussed with Case Management and NOAC (Eliquis or Xarelto) will cost over $900 a month -Will give Free 30 day card and have patient establish with a PCP to have them switch to Coumadin once ready to transition -Will need an Ambulatory Home O2 Screen prior to D/C -Patient did not have a PCP and have asked CM to arrange PCP follow up; Patient to follow up with Dr. [name] on November 17th -Anticipating D/C Home in the AM once transitioned to NOAC   Hypokalemia -Patient's K+ this AM was 3.2 -Mag Level was 2.1 -Replete with po KCl 40 mEQ BID x2 Doses -Continue to Monitor and Replete as Necessary -Repeat CMP in AM   Hyperglycemia -Patient's Blood Sugar on Admission was 105 and repeat this AM was 139 -Check HbA1c -Continue to Monitor and Trend Blood Sugars Carefully -If Necessary will ad Sensitive Novolog SSI AC/HS  Normocytic/Mild Macrocytic Anemia -Patient's Hb/Hct went from 11.0/34.2 -> 9.8/30.5 -Check Anemia Panel in the AM  -Continue to Monitor for S/Sx of Bleeding as Patient is Anticoagulated;  Currently no overt Bleeding noted -Repeat CBC in AM   Obesity -Estimated body mass index is 35.36 kg/m as calculated from the following:   Height as of this encounter: 5\' 2"  (1.575 m).   Weight as of this encounter: 87.7 kg. -Weight Loss and Dietary Counseling given  -Nutritionist recommending Daily MVI  Sinus Tachycardia  -Improved -Continue to Monitor on Telemetry   DVT prophylaxis: Anticoagulated with Heparin gtt Code Status: FULL CODE  Family Communication: No family  present at bedside  Disposition Plan:   Consultants:   None   Procedures:  ECHOCARDIOGRAM   Antimicrobials:  Anti-infectives (From admission, onward)   None     Subjective: Seen and examined and states that she was feeling sleepy and still feeling a little dyspneic but not as bad as yesterday.  Complains of some right leg pain.  No nausea or vomiting.  No other concerns or complaints at this time and denies any chest pain.  Objective: Vitals:   07/01/19 2052 07/01/19 2314 07/01/19 2316 07/02/19 0505  BP: (!) 141/83  (!) 156/92 (!) 119/59  Pulse: 96  96 98  Resp: 19  18 18   Temp:   98.7 F (37.1 C) 98.7 F (37.1 C)  TempSrc:      SpO2: 100%  100% 97%  Weight:  87.7 kg    Height:  5\' 2"  (1.575 m)      Intake/Output Summary (Last 24 hours) at 07/02/2019 0803 Last data filed at 07/02/2019 0600 Gross per 24 hour  Intake 751.2 ml  Output --  Net 751.2 ml   Filed Weights   07/01/19 1537 07/01/19 2314  Weight: 88.5 kg 87.7 kg   Examination: Physical Exam:  Constitutional: WN/WD obese AAF in NAD and appears calm  Eyes: Lids and conjunctivae normal, sclerae anicteric  ENMT: External Ears, Nose appear normal. Grossly normal hearing. Mucous membranes are moist.  Neck: Appears normal, supple, no cervical masses, normal ROM, no appreciable thyromegaly; no JVD Respiratory: Diminished to auscultation bilaterally, no wheezing, rales, rhonchi or crackles. Normal respiratory effort and patient is not tachypenic. No accessory muscle use. Unlabored breathing   Cardiovascular: RRR, no murmurs / rubs / gallops. S1 and S2 auscultated. 1+ LE swelling worse on Right compared to the LEft  Abdomen: Soft, non-tender, Distended 2/2 body habitus. No masses palpated. No appreciable hepatosplenomegaly. Bowel sounds positive x4.  GU: Deferred. Musculoskeletal: No clubbing / cyanosis of digits/nails. No joint deformity upper and lower extremities Skin: No rashes, lesions, ulcers on a limited  skin evaluation. No induration; Warm and dry.  Neurologic: CN 2-12 grossly intact with no focal deficits. Romberg sign and cerebellar reflexes not assessed.  Psychiatric: Normal judgment and insight. Alert and oriented x 3. Normal mood and appropriate affect.   Data Reviewed: I have personally reviewed following labs and imaging studies  CBC: Recent Labs  Lab 07/01/19 1834 07/02/19 0509  WBC 9.3 8.3  NEUTROABS 5.9  --   HGB 11.0* 9.8*  HCT 34.2* 30.5*  MCV 100.3* 99.7  PLT 310 [id number]   Basic Metabolic Panel: Recent Labs  Lab 07/01/19 1834 07/02/19 0509  NA 139 139  K 3.2* 3.2*  CL 105 107  CO2 25 24  GLUCOSE 105* 139*  BUN 14 12  CREATININE 0.73 0.56  CALCIUM 9.3 8.8*   GFR: Estimated Creatinine Clearance: 68.2 mL/min (by C-G formula based on SCr of 0.56 mg/dL). Liver Function Tests: Recent Labs  Lab 07/02/19 0509  AST 12*  ALT 8  ALKPHOS 83  BILITOT 1.1  PROT 6.9  ALBUMIN 3.3*   No results for input(s): LIPASE, AMYLASE in the last 168 hours. No results for input(s): AMMONIA in the last 168 hours. Coagulation Profile: No results for input(s): INR, PROTIME in the last 168 hours. Cardiac Enzymes: No results for input(s): CKTOTAL, CKMB, CKMBINDEX, TROPONINI in the last 168 hours. BNP (last 3 results) No results for input(s): PROBNP in the last 8760 hours. HbA1C: No results for input(s): HGBA1C in the last 72 hours. CBG: No results for input(s): GLUCAP in the last 168 hours. Lipid Profile: No results for input(s): CHOL, HDL, LDLCALC, TRIG, CHOLHDL, LDLDIRECT in the last 72 hours. Thyroid Function Tests: No results for input(s): TSH, T4TOTAL, FREET4, T3FREE, THYROIDAB in the last 72 hours. Anemia Panel: No results for input(s): VITAMINB12, FOLATE, FERRITIN, TIBC, IRON, RETICCTPCT in the last 72 hours. Sepsis Labs: No results for input(s): PROCALCITON, LATICACIDVEN in the last 168 hours.  Recent Results (from the past 240 hour(s))  SARS CORONAVIRUS 2 (TAT  6-24 HRS) Nasopharyngeal Nasopharyngeal Swab     Status: None   Collection Time: 07/01/19  8:50 PM   Specimen: Nasopharyngeal Swab  Result Value Ref Range Status   SARS Coronavirus 2 NEGATIVE NEGATIVE Final    Comment: (NOTE) SARS-CoV-2 target nucleic acids are NOT DETECTED. The SARS-CoV-2 RNA is generally detectable in upper and lower respiratory specimens during the acute phase of infection. Negative results do not preclude SARS-CoV-2 infection, do not rule out co-infections with other pathogens, and should not be used as the sole basis for treatment or other patient management decisions. Negative results must be combined with clinical observations, patient history, and epidemiological information. The expected result is Negative. Fact Sheet for Patients: SugarRoll.be Fact Sheet for Healthcare Providers: https://www.woods-mathews.com/ This test is not yet approved or cleared by the [place] FDA and  has been authorized for detection and/or diagnosis of SARS-CoV-2 by FDA under an Emergency Use Authorization (EUA). This EUA will remain  in effect (meaning this test can be used) for the duration of the COVID-19 declaration under Section 56 4(b)(1) of the Act, 21 U.S.C. section 360bbb-3(b)(1), unless the authorization is terminated or revoked sooner. Performed at [facility], [place] [address] [place], [place] [address]   MRSA PCR Screening     Status: None   Collection Time: 07/01/19 11:23 PM   Specimen: Nasal Mucosa; Nasopharyngeal  Result Value Ref Range Status   MRSA by PCR NEGATIVE NEGATIVE Final    Comment:        The GeneXpert MRSA Assay (FDA approved for NASAL specimens only), is one component of a comprehensive MRSA colonization surveillance program. It is not intended to diagnose MRSA infection nor to guide or monitor treatment for MRSA infections. Performed at [facility], [place] [address]  [place], [place] [address]     Radiology Studies: Ct Angio Chest Pe W And/or Wo Contrast  Result Date: 07/01/2019 CLINICAL DATA:  68 year old female with a history ofa leg pain and concern for pulmonary embolism EXAM: CT ANGIOGRAPHY CHEST WITH CONTRAST TECHNIQUE: Multidetector CT imaging of the chest was performed using the standard protocol during bolus administration of intravenous contrast. Multiplanar CT image reconstructions and MIPs were obtained to evaluate the vascular anatomy. CONTRAST:  166mL OMNIPAQUE IOHEXOL 350 MG/ML SOLN COMPARISON:  None. FINDINGS: Cardiovascular: Heart: No cardiomegaly. No pericardial fluid/thickening. Calcifications of the right coronary artery. Right ventricle left ventricle ratio below 1, estimated 0.94 Aorta: Unremarkable course, caliber, contour of the thoracic aorta.  No aneurysm or dissection flap. No periaortic fluid. Pulmonary arteries: Central filling defects in the segmental and subsegmental left-sided pulmonary arteries of the left lower lobe. No filling defects identified on the right or within the left upper lobe. Mediastinum/Nodes: No mediastinal adenopathy. Unremarkable appearance of the thoracic esophagus. Unremarkable thoracic inlet. Lungs/Pleura: Central airways are clear. No pleural effusion. No confluent airspace disease. No pneumothorax. Upper Abdomen: No acute. Musculoskeletal: No acute displaced fracture. Degenerative changes of the spine. Review of the MIP images confirms the above findings. IMPRESSION: CT is positive for acute pulmonary emboli of the left lower lobe segmental and subsegmental vasculature. Coronary artery disease. These results were called by telephone at the time of interpretation on 07/01/2019 at 8:28 pm to provider DAN FLOYD , who verbally acknowledged these results. Electronically Signed   By: Corrie Mckusick D.O.   On: 07/01/2019 20:34   US Venous Img Lower Unilateral Right  Result Date: 07/01/2019 CLINICAL DATA:  68 year old female with  a history of right calf pain EXAM: RIGHT LOWER EXTREMITY VENOUS DOPPLER ULTRASOUND TECHNIQUE: Gray-scale sonography with graded compression, as well as color Doppler and duplex ultrasound were performed to evaluate the lower extremity deep venous systems from the level of the common femoral vein and including the common femoral, femoral, profunda femoral, popliteal and calf veins including the posterior tibial, peroneal and gastrocnemius veins when visible. The superficial great saphenous vein was also interrogated. Spectral Doppler was utilized to evaluate flow at rest and with distal augmentation maneuvers in the common femoral, femoral and popliteal veins. COMPARISON:  None. FINDINGS: Contralateral Common Femoral Vein: Respiratory phasicity is normal and symmetric with the symptomatic side. No evidence of thrombus. Normal compressibility. Common Femoral Vein: No evidence of thrombus. Normal compressibility, respiratory phasicity and response to augmentation. Saphenofemoral Junction: No evidence of thrombus. Normal compressibility and flow on color Doppler imaging. Profunda Femoral Vein: No evidence of thrombus. Normal compressibility and flow on color Doppler imaging. Femoral Vein: No evidence of thrombus. Normal compressibility, respiratory phasicity and response to augmentation. Popliteal Vein: Noncompressible popliteal vein with occlusive thrombus extending into the tibioperoneal vein. Calf Veins: Occlusive thrombus of the posterior tibial vein extending towards the popliteal vein. Peroneal vein not visualized. Superficial Great Saphenous Vein: No evidence of thrombus. Normal compressibility and flow on color Doppler imaging. Other Findings:  None. IMPRESSION: Sonographic survey of the right lower extremity positive for acute DVT of the popliteal vein extending into the posterior tibial vein. Electronically Signed   By: Corrie Mckusick D.O.   On: 07/01/2019 18:17   Scheduled Meds:  potassium chloride  40 mEq  Oral BID   sodium chloride flush  3 mL Intravenous Q12H   Continuous Infusions:  sodium chloride     heparin 1,200 Units/hr (07/01/19 2128)    LOS: 1 day   Kerney Elbe, DO [facility] PAGER is on AMION  If 7PM-7AM, please contact night-coverage [email or web address] Password [email or web address] 07/02/2019, 8:03 AM

## 2019-07-02 NOTE — Progress Notes (Signed)
ANTICOAGULATION CONSULT NOTE   Pharmacy Consult for Heparin  Indication: pulmonary embolus  No Known Allergies  Patient Measurements: Height: 5\' 2"  (157.5 cm) Weight: 193 lb 4.8 oz (87.7 kg) IBW/kg (Calculated) : 50.1 Heparin Dosing Weight: 70.6 kg  Vital Signs: Temp: 98.7 F (37.1 C) (11/05 0505) BP: 119/59 (11/05 0505) Pulse Rate: 98 (11/05 0505)  Labs: Recent Labs    07/01/19 1834 07/01/19 2020 07/02/19 0509 07/02/19 0733  HGB 11.0*  --  9.8*  --   HCT 34.2*  --  30.5*  --   PLT 310  --  274  --   HEPARINUNFRC  --   --   --  0.80*  CREATININE 0.73  --  0.56  --   TROPONINIHS <2 <2 <2  --     Estimated Creatinine Clearance: 68.2 mL/min (by C-G formula based on SCr of 0.56 mg/dL).   Assessment: Patient is a 13 yof that presented to the ED with calf pain and denied SOB and CP. The patient was found to have a PE and pharmacy has been consulted to dose heparin for this patient. Patient denies any previous DVT/PA and does not appear to be on any blood thinners at this time.  07/02/2019 First heparin level above goal at 0.8 after 5000 unit bolus and drip at 1200 units/hr  Heparin infusing well w/ no issues and no bleeding per d/w RN Hg 11> 8.9, PLTC WNL, SCr WNL Doppler + RLE acute DVT CT + LL lobe segmental PE  Goal of Therapy:  Heparin level 0.3-0.7 units/ml Monitor platelets by anticoagulation protocol: Yes   Plan:  Decrease heparin rate to 1100 units/hr and check 8 hr heparin level Daily heparin level and CBC while on heparin  Monitor for s/s of bleeding and CBC while on heparin drip F/u transition to oral anticoagulant  Eudelia Bunch, Pharm.D [phone] 07/02/2019 8:46 AM

## 2019-07-02 NOTE — Progress Notes (Signed)
Echocardiogram 2D Echocardiogram has been performed.  Ruth Jensen 07/02/2019, 10:53 AM

## 2019-07-02 NOTE — Progress Notes (Signed)
`  ANTICOAGULATION CONSULT NOTE   Pharmacy Consult for Heparin  Indication: pulmonary embolus  No Known Allergies  Patient Measurements: Height: 5\' 2"  (157.5 cm) Weight: 193 lb 4.8 oz (87.7 kg) IBW/kg (Calculated) : 50.1 Heparin Dosing Weight: 70.6 kg  Vital Signs: Temp: 98.5 F (36.9 C) (11/05 1330) Temp Source: Oral (11/05 1330) BP: 134/79 (11/05 1330) Pulse Rate: 87 (11/05 1330)  Labs: Recent Labs    07/01/19 1834 07/01/19 2020 07/02/19 0509 07/02/19 0733 07/02/19 1705  HGB 11.0*  --  9.8*  --   --   HCT 34.2*  --  30.5*  --   --   PLT 310  --  274  --   --   HEPARINUNFRC  --   --   --  0.80* 0.16*  CREATININE 0.73  --  0.56  --   --   TROPONINIHS <2 <2 <2  --   --     Estimated Creatinine Clearance: 68.2 mL/min (by C-G formula based on SCr of 0.56 mg/dL).   Assessment: Patient is a 64 yof that presented to the ED with calf pain and denied SOB and CP. The patient was found to have a PE and pharmacy has been consulted to dose heparin for this patient. Patient denies any previous DVT/PA and does not appear to be on any blood thinners at this time.  07/02/2019 2nd  heparin level below goal at 0.16 after heparin rate decreased to 1100 units/hr  Heparin infusing well w/ no issues and no bleeding per d/w RN - but does mention heparin infusing via AC and does beep sometimes Hg 11> 8.9, PLTC WNL, SCr WNL Doppler + RLE acute DVT CT + LL lobe segmental PE  Goal of Therapy:  Heparin level 0.3-0.7 units/ml Monitor platelets by anticoagulation protocol: Yes   Plan:  Heparin 2000 units bolus andIncrease heparin rate to 1250 units/hr and check 8 hr heparin level Daily heparin level and CBC while on heparin  Monitor for s/s of bleeding and CBC while on heparin drip F/u transition to oral anticoagulant  Eudelia Bunch, Pharm.D [phone] 07/02/2019 6:05 PM

## 2019-07-02 NOTE — TOC Initial Note (Signed)
Transition of Care [facility]) - Initial/Assessment Note    Patient Details  Name: Ruth Jensen MRN: [id number] Date of Birth: [birth year]  [facility] CM/SW Contact:    Dessa Phi, RN Phone Number: 07/02/2019, 11:48 AM  Clinical Narrative: Provided patien tw/pcp listing-she wil make own pcp appt. Benefit checked for script coverage-she does not have script coverage-pharmacy will provide 30day free coupon either xarelto or eliquis depend on which is ordered. Patient currently has script in [place] that has been filled $over 900.00 is the cost-patient informed of no script coverage-patient states she has started to fill out forms for patient asst program. MD updated.No further CM needs.                  Expected Discharge Plan: Home/Self Care Barriers to Discharge: Continued Medical Work up   Patient Goals and CMS Choice        Expected Discharge Plan and Services Expected Discharge Plan: Home/Self Care   Discharge Planning Services: CM Consult                                          Prior Living Arrangements/Services   Lives with:: Spouse Patient language and need for interpreter reviewed:: Yes Do you feel safe going back to the place where you live?: Yes      Need for Family Participation in Patient Care: No (Comment) Care giver support system in place?: Yes (comment)   Criminal Activity/Legal Involvement Pertinent to Current Situation/Hospitalization: No - Comment as needed  Activities of Daily Living Home Assistive Devices/Equipment: None ADL Screening (condition at time of admission) Patient's cognitive ability adequate to safely complete daily activities?: Yes Is the patient deaf or have difficulty hearing?: No Does the patient have difficulty seeing, even when wearing glasses/contacts?: No Does the patient have difficulty concentrating, remembering, or making decisions?: No Patient able to express need for assistance with  ADLs?: Yes Does the patient have difficulty dressing or bathing?: No Independently performs ADLs?: Yes (appropriate for developmental age) Does the patient have difficulty walking or climbing stairs?: No Weakness of Legs: Right(DVT) Weakness of Arms/Hands: None  Permission Sought/Granted Permission sought to share information with : Case Manager Permission granted to share information with : Yes, Verbal Permission Granted  Share Information with NAME: Juliann Pulse RNM [phone]           Emotional Assessment Appearance:: Appears stated age Attitude/Demeanor/Rapport: Gracious Affect (typically observed): Accepting Orientation: : Oriented to Self, Oriented to Place, Oriented to  Time, Oriented to Situation Alcohol / Substance Use: Not Applicable Psych Involvement: No (comment)  Admission diagnosis:  Acute deep vein thrombosis (DVT) of popliteal vein of right lower extremity (HCC) [I82.431] Single subsegmental pulmonary embolism without acute cor pulmonale (HCC) [I26.93] Patient Active Problem List   Diagnosis Date Noted  . Acute pulmonary embolism ([place]) 07/02/2019  . Single subsegmental pulmonary embolism without acute cor pulmonale ([place]) 07/01/2019  . Acute deep vein thrombosis (DVT) of popliteal vein of right lower extremity ([place]) 07/01/2019   PCP:  Patient, No Pcp Per Pharmacy:   [facility] [id number] - [place] - [address] AT [address] [place] [address] Phone: [phone] Fax: [phone]     Social Determinants of Health (SDOH) Interventions    Readmission Risk Interventions No flowsheet data  found.  

## 2019-07-02 NOTE — Plan of Care (Signed)
  Problem: Clinical Measurements: Goal: Cardiovascular complication will be avoided Outcome: Progressing   Problem: Activity: Goal: Risk for activity intolerance will decrease Outcome: Progressing   Problem: Coping: Goal: Level of anxiety will decrease Outcome: Progressing   Problem: Education: Goal: Knowledge of General Education information will improve Description: Including pain rating scale, medication(s)/side effects and non-pharmacologic comfort measures Outcome: Completed/Met   Problem: Nutrition: Goal: Adequate nutrition will be maintained Outcome: Completed/Met   Problem: Pain Managment: Goal: General experience of comfort will improve Outcome: Completed/Met

## 2019-07-02 NOTE — Progress Notes (Signed)
Pt has scheduled an appointment for PCP (Dr. [name]) on Tuesday, November 17th.Stacey Drain

## 2019-07-02 NOTE — Progress Notes (Signed)
SATURATION QUALIFICATIONS: (This note is used to comply with regulatory documentation for home oxygen)  Patient Saturations on Room Air at Rest = 98%  Patient Saturations on Room Air while Ambulating = 96%  Ruth Jensen, [name]

## 2019-07-02 NOTE — Progress Notes (Signed)
Initial Nutrition Assessment  DOCUMENTATION CODES:   Obesity unspecified  INTERVENTION:   - MVI with minerals daily  - Encourage PO intake  NUTRITION DIAGNOSIS:   Increased nutrient needs related to acute illness as evidenced by estimated needs.  GOAL:   Patient will meet greater than or equal to 90% of their needs  MONITOR:   PO intake, Labs, Weight trends  REASON FOR ASSESSMENT:   Consult Assessment of nutrition requirement/status  ASSESSMENT:   68 year old female who presented to the ED on 11/04 with right lower leg/calf pain. Pt noted to be tachycardic. CT showing pulmonary embolism. Pt also found to have acute RLE DVT.   Spoke with pt at bedside. Pt in good spirits, asking RD to order lunch meal. RD placed lunch order of [place] breast, salad with [place] dressing, Sprite, and rice.  Pt reports that she typically has a good/normal appetite. Pt reports that she typically eats 2 main meals daily and a smaller lunch.  Breakfast: grits, eggs, bacon or sausage Lunch: cheese crackers or peanut butter crackers Dinner: chicken, cabbage, rice  Pt reports that her weight has been stable or trending up slightly. Pt reports that she still wears the same clothes she wore 5 years ago.  Pt states that she does not take a daily MVI but is willing for RD to order once during admission.  Medications reviewed and include: Klor-con 40 mEq BID, heparin  Labs reviewed: potassium 3.2, hemoglobin 9.8  NUTRITION - FOCUSED PHYSICAL EXAM:    Most Recent Value  Orbital Region  No depletion  Upper Arm Region  No depletion  Thoracic and Lumbar Region  No depletion  Buccal Region  No depletion  Temple Region  No depletion  Clavicle Bone Region  No depletion  Clavicle and Acromion Bone Region  No depletion  Scapular Bone Region  No depletion  Dorsal Hand  No depletion  Patellar Region  No depletion  Anterior Thigh Region  No depletion  Posterior Calf Region  No depletion  Edema  (RD Assessment)  None  Hair  Reviewed  Eyes  Reviewed  Mouth  Reviewed  Skin  Reviewed  Nails  Reviewed       Diet Order:   Diet Order            Diet regular Room service appropriate? Yes; Fluid consistency: Thin  Diet effective now              EDUCATION NEEDS:   Education needs have been addressed  Skin:  Skin Assessment: Reviewed RN Assessment  Last BM:  06/29/19  Height:   Ht Readings from Last 1 Encounters:  07/01/19 5\' 2"  (1.575 m)    Weight:   Wt Readings from Last 1 Encounters:  07/01/19 87.7 kg    Ideal Body Weight:  50 kg  BMI:  Body mass index is 35.36 kg/m.  Estimated Nutritional Needs:   Kcal:  1650-1850  Protein:  85-100 grams  Fluid:  1.6-1.8 L    Gaynell Face, MS, RD, LDN Inpatient Clinical Dietitian Pager: [phone] Weekend/After Hours: [phone]

## 2019-07-03 ENCOUNTER — Telehealth: Payer: Self-pay | Admitting: Hematology and Oncology

## 2019-07-03 DIAGNOSIS — D539 Nutritional anemia, unspecified: Secondary | ICD-10-CM

## 2019-07-03 DIAGNOSIS — I5042 Chronic combined systolic (congestive) and diastolic (congestive) heart failure: Secondary | ICD-10-CM

## 2019-07-03 LAB — COMPREHENSIVE METABOLIC PANEL
ALT: 12 U/L (ref 0–44)
AST: 14 U/L — ABNORMAL LOW (ref 15–41)
Albumin: 3.8 g/dL (ref 3.5–5.0)
Alkaline Phosphatase: 95 U/L (ref 38–126)
Anion gap: 8 (ref 5–15)
BUN: 15 mg/dL (ref 8–23)
CO2: 24 mmol/L (ref 22–32)
Calcium: 9.2 mg/dL (ref 8.9–10.3)
Chloride: 107 mmol/L (ref 98–111)
Creatinine, Ser: 0.68 mg/dL (ref 0.44–1.00)
GFR calc Af Amer: >60 mL/min (ref >60)
GFR calc non Af Amer: >60 mL/min (ref >60)
Glucose, Bld: 123 mg/dL — ABNORMAL HIGH (ref 70–99)
Potassium: 3.8 mmol/L (ref 3.5–5.1)
Sodium: 139 mmol/L (ref 135–145)
Total Bilirubin: 0.8 mg/dL (ref 0.3–1.2)
Total Protein: 7.8 g/dL (ref 6.5–8.1)

## 2019-07-03 LAB — CBC
HCT: 30.2 % — ABNORMAL LOW (ref 36.0–46.0)
Hemoglobin: 9.4 g/dL — ABNORMAL LOW (ref 12.0–15.0)
MCH: 32.1 pg (ref 26.0–34.0)
MCHC: 31.1 g/dL (ref 30.0–36.0)
MCV: 103.1 fL — ABNORMAL HIGH (ref 80.0–100.0)
Platelets: 277 10*3/uL (ref 150–400)
RBC: 2.93 MIL/uL — ABNORMAL LOW (ref 3.87–5.11)
RDW: 14.6 % (ref 11.5–15.5)
WBC: 7.2 10*3/uL (ref 4.0–10.5)
nRBC: 0 % (ref 0.0–0.2)

## 2019-07-03 LAB — RETICULOCYTES
Immature Retic Fract: 22.5 % — ABNORMAL HIGH (ref 2.3–15.9)
RBC.: 3.19 MIL/uL — ABNORMAL LOW (ref 3.87–5.11)
Retic Count, Absolute: 59.3 10*3/uL (ref 19.0–186.0)
Retic Ct Pct: 1.9 % (ref 0.4–3.1)

## 2019-07-03 LAB — IRON AND TIBC
Iron: 63 ug/dL (ref 28–170)
Saturation Ratios: 17 % (ref 10.4–31.8)
TIBC: 369 ug/dL (ref 250–450)
UIBC: 306 ug/dL

## 2019-07-03 LAB — MAGNESIUM: Magnesium: 2.2 mg/dL (ref 1.7–2.4)

## 2019-07-03 LAB — FERRITIN: Ferritin: 62 ng/mL (ref 11–307)

## 2019-07-03 LAB — HEMOGLOBIN A1C
Hgb A1c MFr Bld: 5.5 % (ref 4.8–5.6)
Mean Plasma Glucose: 111.15 mg/dL

## 2019-07-03 LAB — HEPARIN LEVEL (UNFRACTIONATED): Heparin Unfractionated: 0.76 IU/mL — ABNORMAL HIGH (ref 0.30–0.70)

## 2019-07-03 LAB — PHOSPHORUS: Phosphorus: 2.8 mg/dL (ref 2.5–4.6)

## 2019-07-03 LAB — FOLATE: Folate: 21.6 ng/mL (ref >5.9)

## 2019-07-03 LAB — VITAMIN B12: Vitamin B-12: <50 pg/mL — ABNORMAL LOW (ref 180–914)

## 2019-07-03 MED ORDER — RIVAROXABAN (XARELTO) EDUCATION KIT FOR DVT/PE PATIENTS
PACK | Freq: Once | Status: AC
  Administered 2019-07-03: 13:00:00
  Filled 2019-07-03: qty 1

## 2019-07-03 MED ORDER — RIVAROXABAN (XARELTO) VTE STARTER PACK (15 & 20 MG): ORAL_TABLET | ORAL | 0 refills | Status: DC

## 2019-07-03 MED ORDER — RIVAROXABAN 15 MG PO TABS
15.0000 mg | ORAL_TABLET | Freq: Two times a day (BID) | ORAL | Status: DC
  Administered 2019-07-03: 15 mg via ORAL
  Filled 2019-07-03: qty 1

## 2019-07-03 MED ORDER — ADULT MULTIVITAMIN W/MINERALS CH: 1.0000 | ORAL_TABLET | Freq: Every day | ORAL | 0 refills | Status: DC

## 2019-07-03 NOTE — Care Management Important Message (Signed)
Important Message  Patient Details IM Letter given to Dessa Phi RN to present to the Patient Name: Ruth Jensen MRN: [id number] Date of Birth: March 08, 1951   Medicare Important Message Given:  Yes     Kerin Salen 07/03/2019, 11:20 AM

## 2019-07-03 NOTE — Discharge Summary (Signed)
Physician Discharge Summary  SAKIYAH LEGRANT [id number] DOB: 1951-07-16 DOA: 07/01/2019  PCP: Patient, No Pcp Per  Admit date: 07/01/2019 Discharge date: 07/03/2019  Admitted From: Home Disposition:  Home  Recommendations for Outpatient Follow-up:  1. Follow up with PCP in 1-2 weeks; Appointment made for Nov 17th with [name], FNP; have PCP address and treat vitamin B12 deficiency 2. Follow up with Medical Oncology within 1-2 weeks for Hypercoaguable workup; Has an appointment scheduled for Monday 11/9 with Dr. Lindi Adie  3. Follow up Cardiology within 1-2 weeks; Appointment to be made by Cardiology Dr. Abelina Bachelor  4. Please obtain CMP/CBC, Mag, Phos in one week 5. Please follow up on the following pending results:  Home Health: No  Equipment/Devices: None  Discharge Condition: Stable  CODE STATUS: FULL CODE Diet recommendation: Heart Healthy  Brief/Interim Summary: HPI per Dr. Jani Gravel on 07/01/2019  DianeElebyis a68 y.o.female,woke up Sunday and her calf on her right leg hurt, and used heating pad and didn't seem to help much and because not improving and thus went to ed.Pt denies weight loss, cancer, long trip, or family hx of blood clots  In Ed, pt noted to be tachycardic, .  T 98.3, P 105, R 18, Bp 153/81 Pox 100% on RA Wt 88.5kg  Ultrasound IMPRESSION: Sonographic survey of the right lower extremity positive for acute DVT of the popliteal vein extending into the posterior tibial vein.  CTA chest IMPRESSION: CT is positive for acute pulmonary emboli of the left lower lobe segmental and subsegmental vasculature.  Coronary artery disease.  Wbc 9.3, Hgb 11.0, Plt 310 Na 139, K 3.2, Bun 14, Creatinine 0.73 Trop <2 BNP 74.6,   Pt will be admitted for RLE DVT and acute PE  **Interim History Noted that she became more dyspneic the last 4 days.  Has not had any long trips or any traumatic injuries.  Was concerned about her right leg swelling and  states that she works in a [employer].  She states that she is feeling better and echocardiogram done and was reviewed and showed systolic and diastolic dysfunction.  Currently she is not active bleeding heart failure and is euvolemic and have scheduled an appointment with cardiology in outpatient setting.  Patient was transitioned from heparin drip to a Xarelto dosing and given a free 30-day card and was told to follow-up with PCP within 1 to 2 weeks to discuss transition to a different anticoagulant given the prohibitive cost of Xarelto.  She will also follow-up with medical oncology Dr. Ladona Mow within 1 week and has an appointment scheduled 11 9.  She was deemed stable for discharge at this time and ambulated without issues and did not desaturate.  Discharge Diagnoses:  Principal Problem:   Single subsegmental pulmonary embolism without acute cor pulmonale (HCC) Active Problems:   Acute deep vein thrombosis (DVT) of popliteal vein of right lower extremity (HCC)   Acute pulmonary embolism (HCC)  Acute Right Leg Popliteal Vein and Posterior Tibial Vein DVT with Concomitant Acute PE, poA -Presented with Worsening Dyspnea and Right Leg Swelling -Vascular Duplex of the Right LE showed "Sonographic survey of the right lower extremity positive for acute DVT of the popliteal vein extending into the posterior tibial vein." -CTA PE showed "CT is positive for acute pulmonary emboli of the left lower lobe segmental and subsegmental vasculature. Coronary artery disease." -Checked HS Troponin and was flat at <2 x3 -Checked echocardiogram and showed an EF of 45 to 50% and grade 1 diastolic dysfunction;  echo did note mild right ventricle systolic dysfunction -Started on a Heparin gtt and will transition to a NOAC after 24 hours of AC with Heparin gtt -PESI Score was Class 2 -Spoke with Oncology Dr. Lindi Adie who will arrange for an outpatient follow up for Hypercoaguable workup -Discussed with Case  Management and NOAC (Eliquis or Xarelto) will cost over $900 a month -Will give Free 30 day card and have patient establish with a PCP to have them switch to Coumadin once ready to transition as an outpatient -Will need an [place] prior to D/C she did not desaturate -Patient did not have a PCP and have asked CM to arrange PCP follow up; Patient to follow up with  [name], FNP on November 17th -D/C Home this a.m. when she is transition to NOAC   Hypokalemia -Patient's K+ this AM was 3.8 improved -Mag Level was 2.2 -Continue to Monitor and Replete as Necessary -Repeat CMP in AM   Hyperglycemia -Patient's Blood Sugar on Admission was 105 and repeat this AM was 139 -Check HbA1c and was 5.5 -Continue to Monitor and Trend Blood Sugars Carefully -If Necessary will ad Sensitive Novolog SSI AC/HS  Normocytic/Mild Macrocytic Anemia -Patient's Hb/Hct went from 11.0/34.2 -> 9.8/30.5 -> 9.4/30.2; MCV was 103.1 -Checked Anemia Panel this AM but was not back prior to being discharged -Anemia panel is not back now and showed an iron level 63, U IBC 306, TIBC of 369, saturation ratios of 17%, ferritin level 62, folate level of 21.6, and vitamin B12 level less than 50 -Will defer to PCP to address her severe vitamin B12 deficiency -Continue to Monitor for S/Sx of Bleeding as Patient is Anticoagulated; Currently no overt Bleeding noted -Repeat CBC in AM   Obesity -Estimated body mass index is 35.36 kg/m as calculated from the following:   Height as of this encounter: 5\' 2"  (1.575 m).   Weight as of this encounter: 87.7 kg. -Weight Loss and Dietary Counseling given  -Nutritionist recommending Daily MVI  Sinus Tachycardia  -Improved -Continue to Monitor on Telemetry  -Follow-up in outpatient setting  Chronic Combined Systolic and Diastolic CHF -Currently not decompensated -Seen on ECHO and showed an EF of 45 to 50% and grade 1 diastolic dysfunction -Discussed  with cardiology Dr. Sallyanne Kuster and will need outpatient evaluation and work-up given that she is euvolemic currently -Follow-up with cardiology in outpatient setting  Discharge Instructions  Discharge Instructions    Call MD for:  difficulty breathing, headache or visual disturbances   Complete by: As directed    Call MD for:  extreme fatigue   Complete by: As directed    Call MD for:  hives   Complete by: As directed    Call MD for:  persistant dizziness or light-headedness   Complete by: As directed    Call MD for:  persistant nausea and vomiting   Complete by: As directed    Call MD for:  redness, tenderness, or signs of infection (pain, swelling, redness, odor or green/yellow discharge around incision site)   Complete by: As directed    Call MD for:  severe uncontrolled pain   Complete by: As directed    Call MD for:  temperature >100.4   Complete by: As directed    Diet - low sodium heart healthy   Complete by: As directed    Discharge instructions   Complete by: As directed    You were cared for by a hospitalist during your hospital stay. If  you have any questions about your discharge medications or the care you received while you were in the hospital after you are discharged, you can call the unit and ask to speak with the hospitalist on call if the hospitalist that took care of you is not available. Once you are discharged, your primary care physician will handle any further medical issues. Please note that NO REFILLS for any discharge medications will be authorized once you are discharged, as it is imperative that you return to your primary care physician (or establish a relationship with a primary care physician if you do not have one) for your aftercare needs so that they can reassess your need for medications and monitor your lab values.  Follow up with PCP and Medical Oncology within 1-2 week. Take all medications as prescribed. If symptoms change or worsen please return to  the ED for evaluation   Increase activity slowly   Complete by: As directed      Allergies as of 07/03/2019   No Known Allergies     Medication List    TAKE these medications   multivitamin with minerals Tabs tablet Take 1 tablet by mouth daily. Start taking on: July 04, 2019   Rivaroxaban 15 & 20 MG Tbpk Follow package directions: Take one 15mg  tablet by mouth twice a day. On day 22, switch to one 20mg  tablet once a day. Take with food.      Follow-up Information    Primary Care Physician. Schedule an appointment as soon as possible for a visit.        [name], NP Follow up.   Specialty: Internal Medicine Why: Follow up on November 17th Contact information: [place] [address] [place] [address] [phone]        Nicholas Lose, MD. Call.   Specialty: Hematology and Oncology Why: Follow up within 1 week for Hypercoaguable Workup Contact information: [place] [phone] [place]. Call.   Why: Follow up with Cardiology within 1-2 weeks Contact information: [place] [phone]         No Known Allergies  Consultations:  None but I did discuss the case with Dr. Nicholas Lose of Medical Oncology as well as Dr. Sanda Klein of cardiology  Procedures/Studies: Ct Angio Chest Pe W And/or Wo Contrast  Result Date: 07/01/2019 CLINICAL DATA:  68 year old female with a history ofa leg pain and concern for pulmonary embolism EXAM: CT ANGIOGRAPHY CHEST WITH CONTRAST TECHNIQUE: Multidetector CT imaging of the chest was performed using the standard protocol during bolus administration of intravenous contrast. Multiplanar CT image reconstructions and MIPs were obtained to evaluate the vascular anatomy. CONTRAST:  120mL OMNIPAQUE IOHEXOL 350 MG/ML SOLN COMPARISON:  None. FINDINGS:  Cardiovascular: Heart: No cardiomegaly. No pericardial fluid/thickening. Calcifications of the right coronary artery. Right ventricle left ventricle ratio below 1, estimated 0.94 Aorta: Unremarkable course, caliber, contour of the thoracic aorta. No aneurysm or dissection flap. No periaortic fluid. Pulmonary arteries: Central filling defects in the segmental and subsegmental left-sided pulmonary arteries of the left lower lobe. No filling defects identified on the right or within the left upper lobe. Mediastinum/Nodes: No mediastinal adenopathy. Unremarkable appearance of the thoracic esophagus. Unremarkable thoracic inlet. Lungs/Pleura: Central airways are clear. No pleural effusion. No confluent airspace disease. No pneumothorax. Upper Abdomen: No acute. Musculoskeletal: No acute displaced fracture. Degenerative changes of the  spine. Review of the MIP images confirms the above findings. IMPRESSION: CT is positive for acute pulmonary emboli of the left lower lobe segmental and subsegmental vasculature. Coronary artery disease. These results were called by telephone at the time of interpretation on 07/01/2019 at 8:28 pm to provider DAN FLOYD , who verbally acknowledged these results. Electronically Signed   By: Corrie Mckusick D.O.   On: 07/01/2019 20:34   US Venous Img Lower Unilateral Right  Result Date: 07/01/2019 CLINICAL DATA:  68 year old female with a history of right calf pain EXAM: RIGHT LOWER EXTREMITY VENOUS DOPPLER ULTRASOUND TECHNIQUE: Gray-scale sonography with graded compression, as well as color Doppler and duplex ultrasound were performed to evaluate the lower extremity deep venous systems from the level of the common femoral vein and including the common femoral, femoral, profunda femoral, popliteal and calf veins including the posterior tibial, peroneal and gastrocnemius veins when visible. The superficial great saphenous vein was also interrogated. Spectral Doppler was utilized to evaluate flow  at rest and with distal augmentation maneuvers in the common femoral, femoral and popliteal veins. COMPARISON:  None. FINDINGS: Contralateral Common Femoral Vein: Respiratory phasicity is normal and symmetric with the symptomatic side. No evidence of thrombus. Normal compressibility. Common Femoral Vein: No evidence of thrombus. Normal compressibility, respiratory phasicity and response to augmentation. Saphenofemoral Junction: No evidence of thrombus. Normal compressibility and flow on color Doppler imaging. Profunda Femoral Vein: No evidence of thrombus. Normal compressibility and flow on color Doppler imaging. Femoral Vein: No evidence of thrombus. Normal compressibility, respiratory phasicity and response to augmentation. Popliteal Vein: Noncompressible popliteal vein with occlusive thrombus extending into the tibioperoneal vein. Calf Veins: Occlusive thrombus of the posterior tibial vein extending towards the popliteal vein. Peroneal vein not visualized. Superficial Great Saphenous Vein: No evidence of thrombus. Normal compressibility and flow on color Doppler imaging. Other Findings:  None. IMPRESSION: Sonographic survey of the right lower extremity positive for acute DVT of the popliteal vein extending into the posterior tibial vein. Electronically Signed   By: Corrie Mckusick D.O.   On: 07/01/2019 18:17   ECHOCARDIOGRAM IMPRESSIONS    1. Left ventricular ejection fraction, by visual estimation, is 40 to 45%. The left ventricle has mildly decreased function. There is mildly increased left ventricular hypertrophy. Diffuse hypokinesis, inferior wall looks regionally worse.  2. Left ventricular diastolic parameters are consistent with Grade I diastolic dysfunction (impaired relaxation).  3. Global right ventricle has mildly reduced systolic function.The right ventricular size is normal. No increase in right ventricular wall thickness.  4. Left atrial size was mildly dilated.  5. Right atrial size was  mildly dilated.  6. The mitral valve is normal in structure. Trace mitral valve regurgitation. No evidence of mitral stenosis.  7. The tricuspid valve is normal in structure. Tricuspid valve regurgitation is trivial.  8. The aortic valve is tricuspid. Aortic valve regurgitation is not visualized. No evidence of aortic valve sclerosis or stenosis.  9. The inferior vena cava is normal in size with greater than 50% respiratory variability, suggesting right atrial pressure of 3 mmHg. 10. The tricuspid regurgitant velocity is 2.33 m/s, and with an assumed right atrial pressure of 3 mmHg, the estimated right ventricular systolic pressure is normal at 24.7 mmHg.  FINDINGS  Left Ventricle: Left ventricular ejection fraction, by visual estimation, is 40 to 45%. The left ventricle has mildly decreased function. The left ventricular internal cavity size was the left ventricle is normal in size. There is mildly increased left  ventricular hypertrophy. Left  ventricular diastolic parameters are consistent with Grade I diastolic dysfunction (impaired relaxation).  Right Ventricle: The right ventricular size is normal. No increase in right ventricular wall thickness. Global RV systolic function is has mildly reduced systolic function. The tricuspid regurgitant velocity is 2.33 m/s, and with an assumed right atrial  pressure of 3 mmHg, the estimated right ventricular systolic pressure is normal at 24.7 mmHg.  Left Atrium: Left atrial size was mildly dilated.  Right Atrium: Right atrial size was mildly dilated  Pericardium: There is no evidence of pericardial effusion.  Mitral Valve: The mitral valve is normal in structure. No evidence of mitral valve stenosis by observation. Trace mitral valve regurgitation.  Tricuspid Valve: The tricuspid valve is normal in structure. Tricuspid valve regurgitation is trivial.  Aortic Valve: The aortic valve is tricuspid. Aortic valve regurgitation is not visualized.  The aortic valve is structurally normal, with no evidence of sclerosis or stenosis.  Pulmonic Valve: The pulmonic valve was normal in structure. Pulmonic valve regurgitation is trivial.  Aorta: The aortic root is normal in size and structure.  Venous: The inferior vena cava is normal in size with greater than 50% respiratory variability, suggesting right atrial pressure of 3 mmHg.  IAS/Shunts: No atrial level shunt detected by color flow Doppler.     LEFT VENTRICLE PLAX 2D LVIDd:         4.10 cm       Diastology LVIDs:         2.60 cm       LV e' lateral:   8.05 cm/s LV PW:         1.20 cm       LV E/e' lateral: 7.4 LV IVS:        1.20 cm       LV e' medial:    5.00 cm/s LVOT diam:     2.10 cm       LV E/e' medial:  11.9 LV SV:         50 ml LV SV Index:   24.82 LVOT Area:     3.46 cm   LV Volumes (MOD) LV area d, A2C:    29.30 cm LV area d, A4C:    23.80 cm LV area s, A2C:    21.10 cm LV area s, A4C:    16.10 cm LV major d, A2C:   8.77 cm LV major d, A4C:   8.18 cm LV major s, A2C:   7.85 cm LV major s, A4C:   7.35 cm LV vol d, MOD A2C: 82.0 ml LV vol d, MOD A4C: 57.9 ml LV vol s, MOD A2C: 48.6 ml LV vol s, MOD A4C: 31.7 ml LV SV MOD A2C:     33.4 ml LV SV MOD A4C:     57.9 ml LV SV MOD BP:      31.3 ml  RIGHT VENTRICLE RV S prime:     14.40 cm/s TAPSE (M-mode): 2.6 cm  LEFT ATRIUM             Index LA diam:        3.00 cm 1.59 cm/m LA Vol (A2C):   59.9 ml 31.79 ml/m LA Vol (A4C):   55.0 ml 29.19 ml/m LA Biplane Vol: 59.0 ml 31.31 ml/m  AORTIC VALVE LVOT Vmax:   69.20 cm/s LVOT Vmean:  45.100 cm/s LVOT VTI:    0.137 m   AORTA Ao Root diam: 2.80 cm Ao Asc diam:  3.20 cm  MITRAL  VALVE                        TRICUSPID VALVE MV Area (PHT): 2.96 cm             TR Peak grad:   21.7 mmHg MV PHT:        74.24 msec           TR Vmax:        233.00 cm/s MV Decel Time: 256 msec MV E velocity: 59.60 cm/s 103 cm/s  SHUNTS MV A velocity: 89.50 cm/s  70.3 cm/s Systemic VTI:  0.14 m MV E/A ratio:  0.67       1.5       Systemic Diam: 2.10 cm  Subjective: Seen and examined at bedside states that her shortness of breath was improved but still had some mild lower extremity swelling in the right leg compared to her left.  No nausea or vomiting.  Denies any chest pain.  No other concerns or complaints at this time and felt well and was ready to go home.  Discharge Exam: Vitals:   07/02/19 2121 07/03/19 0426  BP: (!) 144/64 127/85  Pulse: 88 97  Resp: 18 18  Temp: 98.8 F (37.1 C) 98.9 F (37.2 C)  SpO2: 97% 97%   Vitals:   07/02/19 1434 07/02/19 2121 07/03/19 0426 07/03/19 0427  BP:  (!) 144/64 127/85   Pulse:  88 97   Resp:  18 18   Temp:  98.8 F (37.1 C) 98.9 F (37.2 C)   TempSrc:  Oral Oral   SpO2: 96% 97% 97%   Weight:    87.9 kg  Height:       General: Pt is alert, awake, not in acute distress; has poor dentition Cardiovascular: RRR, S1/S2 +, no rubs, no gallops Respiratory: Diminished bilaterally, no wheezing, no rhonchi Abdominal: Soft, NT, distended secondary body habitus, bowel sounds + Extremities: 1+ lower extremity edema on the right compared to left edema, no cyanosis  The results of significant diagnostics from this hospitalization (including imaging, microbiology, ancillary and laboratory) are listed below for reference.     Microbiology: Recent Results (from the past 240 hour(s))  SARS CORONAVIRUS 2 (TAT 6-24 HRS) Nasopharyngeal Nasopharyngeal Swab     Status: None   Collection Time: 07/01/19  8:50 PM   Specimen: Nasopharyngeal Swab  Result Value Ref Range Status   SARS Coronavirus 2 NEGATIVE NEGATIVE Final    Comment: (NOTE) SARS-CoV-2 target nucleic acids are NOT DETECTED. The SARS-CoV-2 RNA is generally detectable in upper and lower respiratory specimens during the acute phase of infection. Negative results do not preclude SARS-CoV-2 infection, do not rule out co-infections with other pathogens,  and should not be used as the sole basis for treatment or other patient management decisions. Negative results must be combined with clinical observations, patient history, and epidemiological information. The expected result is Negative. Fact Sheet for Patients: SugarRoll.be Fact Sheet for Healthcare Providers: https://www.woods-mathews.com/ This test is not yet approved or cleared by the [place] FDA and  has been authorized for detection and/or diagnosis of SARS-CoV-2 by FDA under an Emergency Use Authorization (EUA). This EUA will remain  in effect (meaning this test can be used) for the duration of the COVID-19 declaration under Section 56 4(b)(1) of the Act, 21 U.S.C. section 360bbb-3(b)(1), unless the authorization is terminated or revoked sooner. Performed at [facility], [place] [address] [place],  [address]  MRSA PCR Screening     Status: None   Collection Time: 07/01/19 11:23 PM   Specimen: Nasal Mucosa; Nasopharyngeal  Result Value Ref Range Status   MRSA by PCR NEGATIVE NEGATIVE Final    Comment:        The GeneXpert MRSA Assay (FDA approved for NASAL specimens only), is one component of a comprehensive MRSA colonization surveillance program. It is not intended to diagnose MRSA infection nor to guide or monitor treatment for MRSA infections. Performed at [facility], [place] [address] [place], [place] [address]     Labs: BNP (last 3 results) Recent Labs    07/01/19 1834  BNP [id number]   Basic Metabolic Panel: Recent Labs  Lab 07/01/19 1834 07/02/19 0509 07/02/19 0824 07/03/19 1050  NA 139 139  --  139  K 3.2* 3.2*  --  3.8  CL 105 107  --  107  CO2 25 24  --  24  GLUCOSE 105* 139*  --  123*  BUN 14 12  --  15  CREATININE 0.73 0.56  --  0.68  CALCIUM 9.3 8.8*  --  9.2  MG  --   --  2.1 2.2  PHOS  --   --  3.1 2.8   Liver Function Tests: Recent Labs  Lab 07/02/19 0509  07/03/19 1050  AST 12* 14*  ALT 8 12  ALKPHOS 83 95  BILITOT 1.1 0.8  PROT 6.9 7.8  ALBUMIN 3.3* 3.8   No results for input(s): LIPASE, AMYLASE in the last 168 hours. No results for input(s): AMMONIA in the last 168 hours. CBC: Recent Labs  Lab 07/01/19 1834 07/02/19 0509 07/03/19 0253  WBC 9.3 8.3 7.2  NEUTROABS 5.9  --   --   HGB 11.0* 9.8* 9.4*  HCT 34.2* 30.5* 30.2*  MCV 100.3* 99.7 103.1*  PLT 310 274 277   Cardiac Enzymes: No results for input(s): CKTOTAL, CKMB, CKMBINDEX, TROPONINI in the last 168 hours. BNP: Invalid input(s): POCBNP CBG: No results for input(s): GLUCAP in the last 168 hours. D-Dimer No results for input(s): DDIMER in the last 72 hours. Hgb A1c Recent Labs    07/03/19 0253  HGBA1C 5.5   Lipid Profile No results for input(s): CHOL, HDL, LDLCALC, TRIG, CHOLHDL, LDLDIRECT in the last 72 hours. Thyroid function studies No results for input(s): TSH, T4TOTAL, T3FREE, THYROIDAB in the last 72 hours.  Invalid input(s): FREET3 Anemia work up Recent Labs    07/03/19 1050  VITAMINB12 <50*  FOLATE 21.6  FERRITIN 62  TIBC 369  IRON 63  RETICCTPCT 1.9   Urinalysis No results found for: COLORURINE, APPEARANCEUR, LABSPEC, [place], GLUCOSEU, HGBUR, BILIRUBINUR, KETONESUR, PROTEINUR, UROBILINOGEN, NITRITE, LEUKOCYTESUR Sepsis Labs Invalid input(s): PROCALCITONIN,  WBC,  LACTICIDVEN Microbiology Recent Results (from the past 240 hour(s))  SARS CORONAVIRUS 2 (TAT 6-24 HRS) Nasopharyngeal Nasopharyngeal Swab     Status: None   Collection Time: 07/01/19  8:50 PM   Specimen: Nasopharyngeal Swab  Result Value Ref Range Status   SARS Coronavirus 2 NEGATIVE NEGATIVE Final    Comment: (NOTE) SARS-CoV-2 target nucleic acids are NOT DETECTED. The SARS-CoV-2 RNA is generally detectable in upper and lower respiratory specimens during the acute phase of infection. Negative results do not preclude SARS-CoV-2 infection, do not rule out co-infections with  other pathogens, and should not be used as the sole basis for treatment or other patient management decisions. Negative results must be combined with clinical observations, patient history, and epidemiological information. The  expected result is Negative. Fact Sheet for Patients: SugarRoll.be Fact Sheet for Healthcare Providers: https://www.woods-mathews.com/ This test is not yet approved or cleared by the [place] FDA and  has been authorized for detection and/or diagnosis of SARS-CoV-2 by FDA under an Emergency Use Authorization (EUA). This EUA will remain  in effect (meaning this test can be used) for the duration of the COVID-19 declaration under Section 56 4(b)(1) of the Act, 21 U.S.C. section 360bbb-3(b)(1), unless the authorization is terminated or revoked sooner. Performed at [facility], [place] [address] [place], [place] [address]   MRSA PCR Screening     Status: None   Collection Time: 07/01/19 11:23 PM   Specimen: Nasal Mucosa; Nasopharyngeal  Result Value Ref Range Status   MRSA by PCR NEGATIVE NEGATIVE Final    Comment:        The GeneXpert MRSA Assay (FDA approved for NASAL specimens only), is one component of a comprehensive MRSA colonization surveillance program. It is not intended to diagnose MRSA infection nor to guide or monitor treatment for MRSA infections. Performed at [facility], [place] [address], [place], [place] [address]    Time coordinating discharge: 35 minutes  SIGNED:  Kerney Elbe, DO [facility] 07/03/2019, 5:54 PM Pager is on [place]  If 7PM-7AM, please contact night-coverage [email or web address] Password [email or web address]

## 2019-07-03 NOTE — Progress Notes (Signed)
`  ANTICOAGULATION CONSULT NOTE   Pharmacy Consult for Heparin >> Xarelto Indication: pulmonary embolus  No Known Allergies  Patient Measurements: Height: 5\' 2"  (157.5 cm) Weight: 193 lb 12.6 oz (87.9 kg) IBW/kg (Calculated) : 50.1 Heparin Dosing Weight: 70.6 kg  Vital Signs: Temp: 98.9 F (37.2 C) (11/06 0426) Temp Source: Oral (11/06 0426) BP: 127/85 (11/06 0426) Pulse Rate: 97 (11/06 0426)  Labs: Recent Labs    07/01/19 1834 07/01/19 2020 07/02/19 0509 07/02/19 0733 07/02/19 1705 07/03/19 0253  HGB 11.0*  --  9.8*  --   --  9.4*  HCT 34.2*  --  30.5*  --   --  30.2*  PLT 310  --  274  --   --  277  HEPARINUNFRC  --   --   --  0.80* 0.16* 0.76*  CREATININE 0.73  --  0.56  --   --   --   TROPONINIHS <2 <2 <2  --   --   --     Estimated Creatinine Clearance: 68.3 mL/min (by C-G formula based on SCr of 0.56 mg/dL).   Assessment: Patient is a 52 yof that presented to the ED with calf pain and denied SOB and CP. The patient was found to have a PE and pharmacy has been consulted to dose heparin for this patient. Patient denies any previous DVT/PA and does not appear to be on any blood thinners at this time.  07/03/2019 Doppler + RLE acute DVT CT + LL lobe segmental PE Transition UFH>>Xarelto  Goal of Therapy:  Heparin level 0.3-0.7 units/ml Monitor platelets by anticoagulation protocol: Yes   Plan:  DC heparin drip Xarelto 15 mg po BID w/ food x 21 days then Xarelto 20 mg qday with supper Will provide pt 30 day free card & 10 $ copay card (not sure if she qualifies Will educate pt on [place], Pharm.D [phone] 07/03/2019 11:19 AM

## 2019-07-03 NOTE — Progress Notes (Signed)
ANTICOAGULATION CONSULT NOTE - Follow Up Consult  Pharmacy Consult for Heparin Indication: pulmonary embolus  No Known Allergies  Patient Measurements: Height: 5\' 2"  (157.5 cm) Weight: 193 lb 4.8 oz (87.7 kg) IBW/kg (Calculated) : 50.1 Heparin Dosing Weight:   Vital Signs: Temp: 98.8 F (37.1 C) (11/05 2121) Temp Source: Oral (11/05 2121) BP: 144/64 (11/05 2121) Pulse Rate: 88 (11/05 2121)  Labs: Recent Labs    07/01/19 1834 07/01/19 2020 07/02/19 0509 07/02/19 0733 07/02/19 1705 07/03/19 0253  HGB 11.0*  --  9.8*  --   --  9.4*  HCT 34.2*  --  30.5*  --   --  30.2*  PLT 310  --  274  --   --  277  HEPARINUNFRC  --   --   --  0.80* 0.16* 0.76*  CREATININE 0.73  --  0.56  --   --   --   TROPONINIHS <2 <2 <2  --   --   --     Estimated Creatinine Clearance: 68.2 mL/min (by C-G formula based on SCr of 0.56 mg/dL).   Medications:  Infusions:  . sodium chloride    . heparin 1,250 Units/hr (07/02/19 1821)    Assessment: Patient with heparin level above goal.  No heparin issues per RN.  Goal of Therapy:  Heparin level 0.3-0.7 units/ml Monitor platelets by anticoagulation protocol: Yes   Plan:  Decrease heparin to 1150 units/hr Check level at 1200  [address], [place] 07/03/2019,3:31 AM

## 2019-07-03 NOTE — Telephone Encounter (Signed)
Received a staff msg toschedule a new hem appt for Ruth Jensen to see Dr. Lindi Adie for DVT. When I cld pt was currently still at the hospital. Will f/u w/pt on Monday, 11/9

## 2019-07-03 NOTE — Plan of Care (Signed)
  Problem: Health Behavior/Discharge Planning: Goal: Ability to manage health-related needs will improve Outcome: Completed/Met   Problem: Clinical Measurements: Goal: Ability to maintain clinical measurements within normal limits will improve Outcome: Completed/Met Goal: Diagnostic test results will improve Outcome: Completed/Met Goal: Respiratory complications will improve Outcome: Completed/Met Goal: Cardiovascular complication will be avoided Outcome: Completed/Met   Problem: Activity: Goal: Risk for activity intolerance will decrease Outcome: Completed/Met

## 2019-07-03 NOTE — Discharge Instructions (Signed)
Information on my medicine - XARELTO (rivaroxaban)  This medication education was reviewed with me or my healthcare representative as part of my discharge preparation.  The pharmacist that spoke with me during my hospital stay was:  Shashwat Cleary, Student-PharmD  WHY WAS XARELTO PRESCRIBED FOR YOU? Xarelto was prescribed to treat blood clots that may have been found in the veins of your legs (deep vein thrombosis) or in your lungs (pulmonary embolism) and to reduce the risk of them occurring again.  What do you need to know about Xarelto? The starting dose is one 15 mg tablet taken TWICE daily with food for the FIRST 21 DAYS then on _11/27/20_ or after all the 15mg  tablets have been taken, the dose is changed to one 20 mg tablet taken ONCE A DAY with your evening meal.  DO NOT stop taking Xarelto without talking to the health care provider who prescribed the medication.  Refill your prescription for 20 mg tablets before you run out.  After discharge, you should have regular check-up appointments with your healthcare provider that is prescribing your Xarelto.  In the future your dose may need to be changed if your kidney function changes by a significant amount.  What do you do if you miss a dose? If you are taking Xarelto TWICE DAILY and you miss a dose, take it as soon as you remember. You may take two 15 mg tablets (total 30 mg) at the same time then resume your regularly scheduled 15 mg twice daily the next day.  If you are taking Xarelto ONCE DAILY and you miss a dose, take it as soon as you remember on the same day then continue your regularly scheduled once daily regimen the next day. Do not take two doses of Xarelto at the same time.   Important Safety Information Xarelto is a blood thinner medicine that can cause bleeding. You should call your healthcare provider right away if you experience any of the following: ? Bleeding from an injury or your nose that does not  stop. ? Unusual colored urine (red or dark brown) or unusual colored stools (red or black). ? Unusual bruising for unknown reasons. ? A serious fall or if you hit your head (even if there is no bleeding).  Some medicines may interact with Xarelto and might increase your risk of bleeding while on Xarelto. To help avoid this, consult your healthcare provider or pharmacist prior to using any new prescription or non-prescription medications, including herbals, vitamins, non-steroidal anti-inflammatory drugs (NSAIDs) and supplements.  This website has more information on Xarelto: [email or web address]

## 2019-07-14 ENCOUNTER — Encounter: Payer: Self-pay | Admitting: Family

## 2019-07-14 ENCOUNTER — Ambulatory Visit (INDEPENDENT_AMBULATORY_CARE_PROVIDER_SITE_OTHER): Payer: Medicare Other | Admitting: Family

## 2019-07-14 ENCOUNTER — Other Ambulatory Visit: Payer: Self-pay

## 2019-07-14 VITALS — BP 150/74 | HR 101 | Temp 97.5°F | Resp 16 | Ht 62.0 in | Wt 193.0 lb

## 2019-07-14 DIAGNOSIS — I499 Cardiac arrhythmia, unspecified: Secondary | ICD-10-CM

## 2019-07-14 DIAGNOSIS — I2699 Other pulmonary embolism without acute cor pulmonale: Secondary | ICD-10-CM

## 2019-07-14 DIAGNOSIS — D649 Anemia, unspecified: Secondary | ICD-10-CM

## 2019-07-14 DIAGNOSIS — I509 Heart failure, unspecified: Secondary | ICD-10-CM | POA: Diagnosis not present

## 2019-07-14 DIAGNOSIS — O2230 Deep phlebothrombosis in pregnancy, unspecified trimester: Secondary | ICD-10-CM

## 2019-07-14 DIAGNOSIS — Z23 Encounter for immunization: Secondary | ICD-10-CM | POA: Diagnosis not present

## 2019-07-14 MED ORDER — CYANOCOBALAMIN 1000 MCG/ML IJ SOLN
1000.0000 ug | Freq: Once | INTRAMUSCULAR | Status: AC
  Administered 2019-07-14: 1000 ug via INTRAMUSCULAR

## 2019-07-14 NOTE — Patient Instructions (Signed)
Welcome to [facility]! 

## 2019-07-14 NOTE — Progress Notes (Signed)
Subjective:    Patient ID: Ruth Jensen, female    DOB: 25-May-1951, 68 y.o.   MRN: [id number]  HPI   Patient is a 68 yr old female who presents today to establish care.  She was admitted 11/4-11/6 for acute right lower extremity DVT and PE (Acute PE in the LLL). She presented with Leg pain/swelling and SOB.  She was treated with heparin and transitioned to xarelto.  She was given a 30 day supply.  She was set up with medical oncology- Dr. Ladona Mow for 07/06/19.  Denies calf pain or SOB. Denies chest pain.    Chronic systolic/diastolic CHF- seen on echo with LVEF 45-50% and grade 1 diastolic dysfunction.  It was recommended that she follow up with cardiology as an outpatient.  Wt Readings from Last 3 Encounters:  07/14/19 193 lb (87.5 kg)  07/03/19 193 lb 12.6 oz (87.9 kg)   Anemia/b12 deficiency- noted while hospitalized.  She was sent home on a multivitamin.  Lab Results  Component Value Date   WBC 7.2 07/03/2019   HGB 9.4 (L) 07/03/2019   HCT 30.2 (L) 07/03/2019   MCV 103.1 (H) 07/03/2019   PLT 277 07/03/2019    BP Readings from Last 3 Encounters:  07/14/19 (!) 150/74  07/03/19 127/85    Review of Systems  See HPI  Past Medical History:  Diagnosis Date  . DVT (deep venous thrombosis) ([place]) 07/01/2019  . Pulmonary embolism ([place]) 07/02/2019     Social History   Socioeconomic History  . Marital status: Married    Spouse name: Truman Hayward  . Number of children: 2  . Years of education: Not on file  . Highest education level: Not on file  Occupational History  . Occupation: retired   Scientific laboratory technician  . Financial resource strain: Not hard at all  . Food insecurity    Worry: Never true    Inability: Never true  . Transportation needs    Medical: No    Non-medical: No  Tobacco Use  . Smoking status: Never Smoker  . Smokeless tobacco: Never Used  Substance and Sexual Activity  . Alcohol use: Not Currently  . Drug use: Never  . Sexual activity: Not Currently  Lifestyle   . Physical activity    Days per week: 0 days    Minutes per session: Not on file  . Stress: Not on file  Relationships  . Social Herbalist on phone: More than three times a week    Gets together: Never    Attends religious service: Never    Active member of club or organization: No    Attends meetings of clubs or organizations: Never    Relationship status: Married  . Intimate partner violence    Fear of current or ex partner: No    Emotionally abused: No    Physically abused: No    Forced sexual activity: No  Other Topics Concern  . Not on file  Social History Narrative  . Not on file    Past Surgical History:  Procedure Laterality Date  . ABDOMINAL HYSTERECTOMY      Family History  Problem Relation Age of Onset  . Hypertension Mother   . Hypertension Father     No Known Allergies  Current Outpatient Medications on File Prior to Visit  Medication Sig Dispense Refill  . Multiple Vitamin (MULTIVITAMIN WITH MINERALS) TABS tablet Take 1 tablet by mouth daily. 30 tablet 0  . Rivaroxaban 15 & 20  MG TBPK Follow package directions: Take one 43m tablet by mouth twice a day. On day 22, switch to one 275mtablet once a day. Take with food. 51 each 0   No current facility-administered medications on file prior to visit.     BP (!) 150/74 (BP Location: Right Arm, Patient Position: Sitting, Cuff Size: Large)   Pulse (!) 101   Temp (!) 97.5 F (36.4 C) (Temporal)   Resp 16   Ht _0  (1.575 m)   Wt 193 lb (87.5 kg)   SpO2 100%   BMI 35.30 kg/m       Objective:   Physical Exam Constitutional:      Appearance: She is well-developed.  HENT:     Head: Normocephalic and atraumatic.  Neck:     Musculoskeletal: Neck supple.     Thyroid: No thyromegaly.  Cardiovascular:     Rate and Rhythm: Normal rate and regular rhythm.     Heart sounds: Normal heart sounds. No murmur.  Pulmonary:     Effort: Pulmonary effort is normal. No respiratory distress.      Breath sounds: Normal breath sounds. No wheezing.  Musculoskeletal:     Comments: Mild RLE swelling  Lymphadenopathy:     Cervical: No cervical adenopathy.  Skin:    General: Skin is warm and dry.  Neurological:     Mental Status: She is alert and oriented to person, place, and time.  Psychiatric:        Behavior: Behavior normal.        Thought Content: Thought content normal.        Judgment: Judgment normal.           Assessment & Plan:  Pulmonary embolus/DVT- needs ongoing anticoagulation. Will refer to hematology for advice on duration of therapy given unprovoked nature of her DVT/PE.  She cannot afford xarelto. She has 2.5 weeks left of sample. Will plan to transition her to warfarin prior to completion and we will discuss plan in further detail at her follow up appointment in 1 week.  CHF (Systolic and diastolic)- clinically compensated.  Refer to cardiology for ongoing management and consultation.    Irregular heart rate- EKG is performed today and personally reviewed. Notes frequent PVC's/ventricular trigeminy. No acute changes when compared to previous EKG on file.   Anemia/b12 deficiency- will initiate b12 injection 100080monce weekly for 4 weeks then once monthly. Will also have her complete an IFOB kit to rule out GI loss. Continue MVI.   40 minutes spent with patient today.  >50 percent of this time was spent counseling pt on Blood clots/anticoagulation, anemia and plan of care.

## 2019-07-20 ENCOUNTER — Other Ambulatory Visit: Payer: Self-pay

## 2019-07-21 ENCOUNTER — Other Ambulatory Visit (INDEPENDENT_AMBULATORY_CARE_PROVIDER_SITE_OTHER): Payer: Medicare Other

## 2019-07-21 ENCOUNTER — Inpatient Hospital Stay: Payer: Medicare Other

## 2019-07-21 ENCOUNTER — Ambulatory Visit (INDEPENDENT_AMBULATORY_CARE_PROVIDER_SITE_OTHER): Payer: Medicare Other | Admitting: Family

## 2019-07-21 ENCOUNTER — Inpatient Hospital Stay: Payer: Medicare Other | Attending: Hematology | Admitting: Hematology

## 2019-07-21 ENCOUNTER — Other Ambulatory Visit: Payer: Self-pay | Admitting: Hematology

## 2019-07-21 ENCOUNTER — Encounter: Payer: Self-pay | Admitting: Hematology

## 2019-07-21 ENCOUNTER — Encounter: Payer: Self-pay | Admitting: Family

## 2019-07-21 ENCOUNTER — Other Ambulatory Visit: Payer: Medicare Other

## 2019-07-21 ENCOUNTER — Other Ambulatory Visit: Payer: Self-pay

## 2019-07-21 VITALS — BP 153/81 | HR 90 | Temp 97.1°F | Resp 18 | Ht 62.0 in | Wt 194.1 lb

## 2019-07-21 VITALS — BP 135/103 | HR 88 | Temp 97.4°F | Resp 16 | Wt 195.0 lb

## 2019-07-21 DIAGNOSIS — I82401 Acute embolism and thrombosis of unspecified deep veins of right lower extremity: Secondary | ICD-10-CM

## 2019-07-21 DIAGNOSIS — I5042 Chronic combined systolic (congestive) and diastolic (congestive) heart failure: Secondary | ICD-10-CM | POA: Diagnosis not present

## 2019-07-21 DIAGNOSIS — Z7901 Long term (current) use of anticoagulants: Secondary | ICD-10-CM | POA: Insufficient documentation

## 2019-07-21 DIAGNOSIS — I2699 Other pulmonary embolism without acute cor pulmonale: Secondary | ICD-10-CM

## 2019-07-21 DIAGNOSIS — R030 Elevated blood-pressure reading, without diagnosis of hypertension: Secondary | ICD-10-CM | POA: Diagnosis not present

## 2019-07-21 DIAGNOSIS — D649 Anemia, unspecified: Secondary | ICD-10-CM | POA: Insufficient documentation

## 2019-07-21 DIAGNOSIS — I82431 Acute embolism and thrombosis of right popliteal vein: Secondary | ICD-10-CM | POA: Diagnosis not present

## 2019-07-21 DIAGNOSIS — D539 Nutritional anemia, unspecified: Secondary | ICD-10-CM

## 2019-07-21 DIAGNOSIS — E538 Deficiency of other specified B group vitamins: Secondary | ICD-10-CM

## 2019-07-21 LAB — CBC WITH DIFFERENTIAL (CANCER CENTER ONLY)
Abs Immature Granulocytes: 0.02 10*3/uL (ref 0.00–0.07)
Basophils Absolute: 0 10*3/uL (ref 0.0–0.1)
Basophils Relative: 0 %
Eosinophils Absolute: 0.3 10*3/uL (ref 0.0–0.5)
Eosinophils Relative: 4 %
HCT: 34 % — ABNORMAL LOW (ref 36.0–46.0)
Hemoglobin: 11.1 g/dL — ABNORMAL LOW (ref 12.0–15.0)
Immature Granulocytes: 0 %
Lymphocytes Relative: 34 %
Lymphs Abs: 2.7 10*3/uL (ref 0.7–4.0)
MCH: 31.6 pg (ref 26.0–34.0)
MCHC: 32.6 g/dL (ref 30.0–36.0)
MCV: 96.9 fL (ref 80.0–100.0)
Monocytes Absolute: 0.7 10*3/uL (ref 0.1–1.0)
Monocytes Relative: 9 %
Neutro Abs: 4 10*3/uL (ref 1.7–7.7)
Neutrophils Relative %: 53 %
Platelet Count: 365 10*3/uL (ref 150–400)
RBC: 3.51 MIL/uL — ABNORMAL LOW (ref 3.87–5.11)
RDW: 14.8 % (ref 11.5–15.5)
WBC Count: 7.8 10*3/uL (ref 4.0–10.5)
nRBC: 0 % (ref 0.0–0.2)

## 2019-07-21 LAB — CMP (CANCER CENTER ONLY)
ALT: 7 U/L (ref 0–44)
AST: 12 U/L — ABNORMAL LOW (ref 15–41)
Albumin: 4.3 g/dL (ref 3.5–5.0)
Alkaline Phosphatase: 87 U/L (ref 38–126)
Anion gap: 7 (ref 5–15)
BUN: 10 mg/dL (ref 8–23)
CO2: 31 mmol/L (ref 22–32)
Calcium: 9.6 mg/dL (ref 8.9–10.3)
Chloride: 106 mmol/L (ref 98–111)
Creatinine: 0.74 mg/dL (ref 0.44–1.00)
GFR, Est AFR Am: >60 mL/min (ref >60)
GFR, Estimated: >60 mL/min (ref >60)
Glucose, Bld: 110 mg/dL — ABNORMAL HIGH (ref 70–99)
Potassium: 3.6 mmol/L (ref 3.5–5.1)
Sodium: 144 mmol/L (ref 135–145)
Total Bilirubin: 0.6 mg/dL (ref 0.3–1.2)
Total Protein: 8.1 g/dL (ref 6.5–8.1)

## 2019-07-21 LAB — VITAMIN B12: Vitamin B-12: >7500 pg/mL — ABNORMAL HIGH (ref 180–914)

## 2019-07-21 LAB — FOLATE: Folate: 10.5 ng/mL (ref >5.9)

## 2019-07-21 LAB — SAVE SMEAR(SSMR), FOR PROVIDER SLIDE REVIEW

## 2019-07-21 LAB — FECAL OCCULT BLOOD, IMMUNOCHEMICAL: Fecal Occult Bld: NEGATIVE

## 2019-07-21 MED ORDER — CYANOCOBALAMIN 1000 MCG/ML IJ SOLN
1000.0000 ug | Freq: Once | INTRAMUSCULAR | Status: AC
  Administered 2019-07-21: 1000 ug via INTRAMUSCULAR

## 2019-07-21 MED ORDER — RIVAROXABAN 20 MG PO TABS: 20.0000 mg | ORAL_TABLET | Freq: Every day | ORAL | 3 refills | Status: DC

## 2019-07-21 NOTE — Progress Notes (Signed)
Ruth Jensen CONSULT NOTE  Patient Care Team: [name], NP as PCP - General (Internal Medicine)  HEME/ONC OVERVIEW: 1. Acute LLL PTE and RLE DVT  -06/2019: acute PTE involving LLL segmental and subsegmental veins, as well as DVT involving R popliteal extending to posterior tibial veins  On Xarelto 20mg  daily   ASSESSMENT & PLAN:   Acute LLL PTE and RLE DVT  -I reviewed the patient's records in detail, including recent hospitalization records, lab studies, and imaging results -I also independently reviewed the radiologic images of recent CTA chest, and agree with findings documented -In summary, patient presented to [facility] ER in early 06/2019 for evaluation of the very onset right lower extremity swelling and shortness of breath.  In the ED, Doppler showed an acute DVT involving the right popliteal extending to the posterior tibial veins.  In addition, CTA chest showed acute PTE involving the left lower lobe segmental and subsegmental veins.  Patient was started on heparin drip and transition to Xarelto at the time of discharge. -I reviewed with the patient about the plan for care for DVT and PTE. -This last episode of blood clot appeared to be unprovoked. We discussed about the pros and cons about testing for thrombophilia disorder. Her current anticoagulation therapy will interfere with some the tests and it is not possible to interpret the test results. Taking her off the anticoagulation therapy to do the tests may precipitate another thrombotic event. I do not see a reason to order additional testing to screen for thrombophilia disorder as it would not change our management. In the absence of major contraindications, the goal of anticoagulation therapy is lifelong.  -She is tolerating Xarelto well without any significant abnormal bleeding or bruising, and we will continue Xarelto for now -However, due to the high co-pay for the medication, I have requested the financial  counselor to initiate the application for medication assistance -I recommend the patient to use elastic compression stockings at 20-30 mmHg to reduce risks of chronic thrombophlebitis. -Finally, I reinforced the importance of preventive strategies such as avoiding hormonal supplement, avoiding cigarette smoking, keeping up-to-date with screening programs for early cancer detection, frequent ambulation for long distance travel and aggressive DVT prophylaxis in all surgical settings. -Should she need any interruption of the anticoagulation for elective procedures in the future, feel free to contact me regarding peri-operative management.  Normocytic anemia -Hgb 9.4 with MCV 103 during the recent hospitalization, new; Hgb normal in 2019 -Hgb 11.1 with MCV 97 today, improving  -I have ordered iron panel, B12 and folate to rule out nutritional deficiencies -As the patient has not had any colon cancer screening in over 10 years, I have referred the patient to gastroenterology for screening colonoscopy   Health counseling -In light of the unprovoked DVT and PTE, I strongly encouraged the patient to maintain up-to-date cancer screening, including MMG, colonoscopy and PAP smear -GI referral as above  -Patient will follow up with her PCP for further age-appropriate cancer screening   Orders Placed This Encounter  Procedures  . CBC w/ diff    Standing Status:   Future    Standing Expiration Date:   08/24/2020  . CMP    Standing Status:   Future    Standing Expiration Date:   08/24/2020  . D-dimer    Standing Status:   Future    Standing Expiration Date:   08/24/2020  . Ambulatory referral to Gastroenterology    Referral Priority:   Routine  Referral Type:   Consultation    Referral Reason:   Specialty Services Required    Referred to Provider:   Lavena Bullion, DO    Number of Visits Requested:   1   All questions were answered. The patient knows to call the clinic with any problems,  questions or concerns.  Return in 3 months for labs and clinic follow-up.   [name], MD 07/21/2019 12:57 PM   CHIEF COMPLAINTS/PURPOSE OF CONSULTATION:  "I am doing okay"  HISTORY OF PRESENTING ILLNESS:  Ruth Jensen 68 y.o. female is here because of newly diagnosed right lower extremity DVT and left lower lobe segmental/subsegmental PTE.  Patient reports that she was at her usual health until early 06/2019, when she developed a new onset of right lower extremity swelling and shortness of breath.  She denied any recent traveling, surgeries, medication changes (such as OTC hormonal supplements), or injury.  She denies any family history of blood clotting disorder.  She presented to [facility] ER in early 06/2019 for evaluation of these symptoms.  In the ED, Doppler showed an acute DVT involving the right popliteal extending to the posterior tibial veins.  In addition, CTA chest showed acute PTE involving the left lower lobe segmental and subsegmental veins.  Patient was started on heparin drip and transition to Xarelto at the time of discharge.  Patient reports that since discharge, her right lower extremity swelling has improved, and she denies any recurrent shortness of breath.  She is tolerating Xarelto well without any abnormal bleeding or bruising.  She denies any other complaint today.  REVIEW OF SYSTEMS:   Constitutional: ( - ) fevers, ( - )  chills , ( - ) night sweats Eyes: ( - ) blurriness of vision, ( - ) double vision, ( - ) watery eyes Ears, nose, mouth, throat, and face: ( - ) mucositis, ( - ) sore throat Respiratory: ( - ) cough, ( - ) dyspnea, ( - ) wheezes Cardiovascular: ( - ) palpitation, ( - ) chest discomfort, ( - ) lower extremity swelling Gastrointestinal:  ( - ) nausea, ( - ) heartburn, ( - ) change in bowel habits Skin: ( - ) abnormal skin rashes Lymphatics: ( - ) new lymphadenopathy, ( - ) easy bruising Neurological: ( - ) numbness, ( - ) tingling, ( - ) new  weaknesses Behavioral/Psych: ( - ) mood change, ( - ) new changes  All other systems were reviewed with the patient and are negative.  MEDICAL HISTORY:  Past Medical History:  Diagnosis Date  . DVT (deep venous thrombosis) ([place]) 07/01/2019  . Pulmonary embolism ([place]) 07/02/2019    SURGICAL HISTORY: Past Surgical History:  Procedure Laterality Date  . ABDOMINAL HYSTERECTOMY  2010   due to fibroids    SOCIAL HISTORY: Social History   Socioeconomic History  . Marital status: Married    Spouse name: Truman Hayward  . Number of children: 2  . Years of education: Not on file  . Highest education level: Not on file  Occupational History  . Occupation: retired   Scientific laboratory technician  . Financial resource strain: Not hard at all  . Food insecurity    Worry: Never true    Inability: Never true  . Transportation needs    Medical: No    Non-medical: No  Tobacco Use  . Smoking status: Never Smoker  . Smokeless tobacco: Never Used  Substance and Sexual Activity  . Alcohol use: Not Currently  . Drug  use: Never  . Sexual activity: Not Currently  Lifestyle  . Physical activity    Days per week: 0 days    Minutes per session: Not on file  . Stress: Not on file  Relationships  . Social Herbalist on phone: More than three times a week    Gets together: Never    Attends religious service: Never    Active member of club or organization: No    Attends meetings of clubs or organizations: Never    Relationship status: Married  . Intimate partner violence    Fear of current or ex partner: No    Emotionally abused: No    Physically abused: No    Forced sexual activity: No  Other Topics Concern  . Not on file  Social History Narrative  . Not on file    FAMILY HISTORY: Family History  Problem Relation Age of Onset  . Hypertension Mother   . CVA Mother        hemiparesis  . Hypertension Father   . Dementia Father   . Hypertension Sister   . CAD Sister        scheduled for  pacemaker  . Asthma Paternal Grandfather   . Hypertension Sister   . Skin cancer Sister        melanoma died at age 73  . Hypertension Sister        ? PFO repair  . Parkinson's disease Sister   . Hypertension Daughter   . Hypertension Son        pacemaker    ALLERGIES:  has No Known Allergies.  MEDICATIONS:  Current Outpatient Medications  Medication Sig Dispense Refill  . Multiple Vitamin (MULTIVITAMIN WITH MINERALS) TABS tablet Take 1 tablet by mouth daily. 30 tablet 0  . Rivaroxaban 15 & 20 MG TBPK Follow package directions: Take one 15mg  tablet by mouth twice a day. On day 22, switch to one 20mg  tablet once a day. Take with food. 51 each 0  . rivaroxaban (XARELTO) 20 MG TABS tablet Take 1 tablet (20 mg total) by mouth daily with supper. 90 tablet 3   No current facility-administered medications for this visit.     PHYSICAL EXAMINATION: ECOG PERFORMANCE STATUS: 0 - Asymptomatic  Vitals:   07/21/19 1230  BP: (!) 153/81  Pulse: 90  Resp: 18  Temp: (!) 97.1 F (36.2 C)  SpO2: 100%   Filed Weights   07/21/19 1230  Weight: 194 lb 1.9 oz (88.1 kg)    GENERAL: alert, no distress and comfortable SKIN: skin color, texture, turgor are normal, no rashes or significant lesions EYES: conjunctiva are pink and non-injected, sclera clear OROPHARYNX: no exudate, no erythema; lips, buccal mucosa, and tongue normal  NECK: supple, non-tender LYMPH:  no palpable lymphadenopathy in the cervical LUNGS: clear to auscultation with normal breathing effort HEART: regular rate & rhythm, no murmurs, no lower extremity edema ABDOMEN: soft, non-tender, non-distended, normal bowel sounds Musculoskeletal: no cyanosis of digits and no clubbing  PSYCH: alert & oriented x 3, fluent speech  LABORATORY DATA:  I have reviewed the data as listed Lab Results  Component Value Date   WBC 7.8 07/21/2019   HGB 11.1 (L) 07/21/2019   HCT 34.0 (L) 07/21/2019   MCV 96.9 07/21/2019   PLT 365  07/21/2019   Lab Results  Component Value Date   NA 144 07/21/2019   K 3.6 07/21/2019   CL 106 07/21/2019   CO2 31 07/21/2019  RADIOGRAPHIC STUDIES: I have personally reviewed the radiological images as listed and agreed with the findings in the report. Ct Angio Chest Pe W And/or Wo Contrast  Result Date: 07/01/2019 CLINICAL DATA:  68 year old female with a history ofa leg pain and concern for pulmonary embolism EXAM: CT ANGIOGRAPHY CHEST WITH CONTRAST TECHNIQUE: Multidetector CT imaging of the chest was performed using the standard protocol during bolus administration of intravenous contrast. Multiplanar CT image reconstructions and MIPs were obtained to evaluate the vascular anatomy. CONTRAST:  154mL OMNIPAQUE IOHEXOL 350 MG/ML SOLN COMPARISON:  None. FINDINGS: Cardiovascular: Heart: No cardiomegaly. No pericardial fluid/thickening. Calcifications of the right coronary artery. Right ventricle left ventricle ratio below 1, estimated 0.94 Aorta: Unremarkable course, caliber, contour of the thoracic aorta. No aneurysm or dissection flap. No periaortic fluid. Pulmonary arteries: Central filling defects in the segmental and subsegmental left-sided pulmonary arteries of the left lower lobe. No filling defects identified on the right or within the left upper lobe. Mediastinum/Nodes: No mediastinal adenopathy. Unremarkable appearance of the thoracic esophagus. Unremarkable thoracic inlet. Lungs/Pleura: Central airways are clear. No pleural effusion. No confluent airspace disease. No pneumothorax. Upper Abdomen: No acute. Musculoskeletal: No acute displaced fracture. Degenerative changes of the spine. Review of the MIP images confirms the above findings. IMPRESSION: CT is positive for acute pulmonary emboli of the left lower lobe segmental and subsegmental vasculature. Coronary artery disease. These results were called by telephone at the time of interpretation on 07/01/2019 at 8:28 pm to provider DAN FLOYD  , who verbally acknowledged these results. Electronically Signed   By: Corrie Mckusick D.O.   On: 07/01/2019 20:34   US Venous Img Lower Unilateral Right  Result Date: 07/01/2019 CLINICAL DATA:  68 year old female with a history of right calf pain EXAM: RIGHT LOWER EXTREMITY VENOUS DOPPLER ULTRASOUND TECHNIQUE: Gray-scale sonography with graded compression, as well as color Doppler and duplex ultrasound were performed to evaluate the lower extremity deep venous systems from the level of the common femoral vein and including the common femoral, femoral, profunda femoral, popliteal and calf veins including the posterior tibial, peroneal and gastrocnemius veins when visible. The superficial great saphenous vein was also interrogated. Spectral Doppler was utilized to evaluate flow at rest and with distal augmentation maneuvers in the common femoral, femoral and popliteal veins. COMPARISON:  None. FINDINGS: Contralateral Common Femoral Vein: Respiratory phasicity is normal and symmetric with the symptomatic side. No evidence of thrombus. Normal compressibility. Common Femoral Vein: No evidence of thrombus. Normal compressibility, respiratory phasicity and response to augmentation. Saphenofemoral Junction: No evidence of thrombus. Normal compressibility and flow on color Doppler imaging. Profunda Femoral Vein: No evidence of thrombus. Normal compressibility and flow on color Doppler imaging. Femoral Vein: No evidence of thrombus. Normal compressibility, respiratory phasicity and response to augmentation. Popliteal Vein: Noncompressible popliteal vein with occlusive thrombus extending into the tibioperoneal vein. Calf Veins: Occlusive thrombus of the posterior tibial vein extending towards the popliteal vein. Peroneal vein not visualized. Superficial Great Saphenous Vein: No evidence of thrombus. Normal compressibility and flow on color Doppler imaging. Other Findings:  None. IMPRESSION: Sonographic survey of the right  lower extremity positive for acute DVT of the popliteal vein extending into the posterior tibial vein. Electronically Signed   By: Corrie Mckusick D.O.   On: 07/01/2019 18:17    PATHOLOGY: I have reviewed the pathology reports as documented in the oncologist history.

## 2019-07-21 NOTE — Progress Notes (Signed)
Subjective:    Patient ID: Ruth Jensen, female    DOB: 03-Aug-1951, 68 y.o.   MRN: [id number]  HPI  Patient is a 68 yr old female who presents today for follow up.    DVT/PE- she is maintained on xarelto. Has appointment with hematology later today.   Chronic systolic/diastolic CHF- last visit we referred her to cardiology for further evaluation. She is scheduled for consult 11/25.  b12 deficiency- last visit we initiated b12 injection.     Review of Systems See HPI  Past Medical History:  Diagnosis Date  . DVT (deep venous thrombosis) ([place]) 07/01/2019  . Pulmonary embolism ([place]) 07/02/2019     Social History   Socioeconomic History  . Marital status: Married    Spouse name: Truman Hayward  . Number of children: 2  . Years of education: Not on file  . Highest education level: Not on file  Occupational History  . Occupation: retired   Scientific laboratory technician  . Financial resource strain: Not hard at all  . Food insecurity    Worry: Never true    Inability: Never true  . Transportation needs    Medical: No    Non-medical: No  Tobacco Use  . Smoking status: Never Smoker  . Smokeless tobacco: Never Used  Substance and Sexual Activity  . Alcohol use: Not Currently  . Drug use: Never  . Sexual activity: Not Currently  Lifestyle  . Physical activity    Days per week: 0 days    Minutes per session: Not on file  . Stress: Not on file  Relationships  . Social Herbalist on phone: More than three times a week    Gets together: Never    Attends religious service: Never    Active member of club or organization: No    Attends meetings of clubs or organizations: Never    Relationship status: Married  . Intimate partner violence    Fear of current or ex partner: No    Emotionally abused: No    Physically abused: No    Forced sexual activity: No  Other Topics Concern  . Not on file  Social History Narrative  . Not on file    Past Surgical History:  Procedure Laterality  Date  . ABDOMINAL HYSTERECTOMY  2010   due to fibroids    Family History  Problem Relation Age of Onset  . Hypertension Mother   . CVA Mother        hemiparesis  . Hypertension Father   . Dementia Father   . Hypertension Sister   . CAD Sister        scheduled for pacemaker  . Asthma Paternal Grandfather   . Hypertension Sister   . Skin cancer Sister        melanoma died at age 41  . Hypertension Sister        ? PFO repair  . Parkinson's disease Sister   . Hypertension Daughter   . Hypertension Son        pacemaker    No Known Allergies  Current Outpatient Medications on File Prior to Visit  Medication Sig Dispense Refill  . Multiple Vitamin (MULTIVITAMIN WITH MINERALS) TABS tablet Take 1 tablet by mouth daily. 30 tablet 0  . Rivaroxaban 15 & 20 MG TBPK Follow package directions: Take one 15mg  tablet by mouth twice a day. On day 22, switch to one 20mg  tablet once a day. Take with food. 51 each 0  No current facility-administered medications on file prior to visit.     BP (!) 135/103 (BP Location: Right Arm, Patient Position: Sitting, Cuff Size: Large)   Pulse 88   Temp (!) 97.4 F (36.3 C) (Temporal)   Resp 16   Wt 195 lb (88.5 kg)   SpO2 100%   BMI 35.67 kg/m       Objective:   Physical Exam Constitutional:      Appearance: She is well-developed.  Neck:     Musculoskeletal: Neck supple.     Thyroid: No thyromegaly.  Cardiovascular:     Rate and Rhythm: Normal rate and regular rhythm.     Heart sounds: Normal heart sounds. No murmur.  Pulmonary:     Effort: Pulmonary effort is normal. No respiratory distress.     Breath sounds: Normal breath sounds. No wheezing.  Skin:    General: Skin is warm and dry.  Neurological:     Mental Status: She is alert and oriented to person, place, and time.  Psychiatric:        Behavior: Behavior normal.        Thought Content: Thought content normal.        Judgment: Judgment normal.           Assessment &  Plan:  DVT/PE- continue xarelto.  Hematology will help her apply for pt assistance.  In the meantime, I have given her some samples to carry her. (we had xarelto 10mg  and she is to take 2 tabs once daily, #56 tabs provided). Oxygen saturation is 100%. She is clinically stable.  Elevated blood pressure reading- Not currently on antihypertensive.  Plan to recheck bp at her upcoming nurse visit. If still elevated, start antihypertensive. BP Readings from Last 3 Encounters:  07/21/19 (!) 153/81  07/21/19 (!) 135/103  07/14/19 (!) 150/74   B12 deficiency- continue b12 injections.   Chronic diastolic/systolic CHF- clinically euvolemic. Defer management to cardiology.

## 2019-07-22 ENCOUNTER — Telehealth: Payer: Self-pay | Admitting: *Deleted

## 2019-07-22 ENCOUNTER — Encounter: Payer: Self-pay | Admitting: Cardiology

## 2019-07-22 ENCOUNTER — Ambulatory Visit (INDEPENDENT_AMBULATORY_CARE_PROVIDER_SITE_OTHER): Payer: Medicare Other | Admitting: Cardiology

## 2019-07-22 ENCOUNTER — Telehealth: Payer: Self-pay | Admitting: Family

## 2019-07-22 VITALS — BP 120/84 | HR 95 | Ht 62.0 in | Wt 195.0 lb

## 2019-07-22 DIAGNOSIS — R079 Chest pain, unspecified: Secondary | ICD-10-CM | POA: Insufficient documentation

## 2019-07-22 DIAGNOSIS — Z13220 Encounter for screening for lipoid disorders: Secondary | ICD-10-CM | POA: Insufficient documentation

## 2019-07-22 DIAGNOSIS — R931 Abnormal findings on diagnostic imaging of heart and coronary circulation: Secondary | ICD-10-CM

## 2019-07-22 DIAGNOSIS — R9431 Abnormal electrocardiogram [ECG] [EKG]: Secondary | ICD-10-CM | POA: Insufficient documentation

## 2019-07-22 DIAGNOSIS — Z01812 Encounter for preprocedural laboratory examination: Secondary | ICD-10-CM

## 2019-07-22 DIAGNOSIS — R0989 Other specified symptoms and signs involving the circulatory and respiratory systems: Secondary | ICD-10-CM | POA: Insufficient documentation

## 2019-07-22 DIAGNOSIS — E669 Obesity, unspecified: Secondary | ICD-10-CM

## 2019-07-22 HISTORY — DX: Abnormal electrocardiogram (ECG) (EKG): R94.31

## 2019-07-22 HISTORY — DX: Other specified symptoms and signs involving the circulatory and respiratory systems: R09.89

## 2019-07-22 HISTORY — DX: Chest pain, unspecified: R07.9

## 2019-07-22 HISTORY — DX: Encounter for screening for lipoid disorders: Z13.220

## 2019-07-22 HISTORY — DX: Abnormal findings on diagnostic imaging of heart and coronary circulation: R93.1

## 2019-07-22 HISTORY — DX: Obesity, unspecified: E66.9

## 2019-07-22 LAB — IRON AND TIBC
Iron: 41 ug/dL (ref 41–142)
Saturation Ratios: 11 % — ABNORMAL LOW (ref 21–57)
TIBC: 360 ug/dL (ref 236–444)
UIBC: 319 ug/dL (ref 120–384)

## 2019-07-22 LAB — FERRITIN: Ferritin: 9 ng/mL — ABNORMAL LOW (ref 11–307)

## 2019-07-22 MED ORDER — METOPROLOL SUCCINATE ER 25 MG PO TB24: 12.5000 mg | ORAL_TABLET | Freq: Every day | ORAL | 1 refills | Status: DC

## 2019-07-22 MED ORDER — NITROGLYCERIN 0.4 MG SL SUBL: 0.4000 mg | SUBLINGUAL_TABLET | SUBLINGUAL | 3 refills | Status: DC | PRN

## 2019-07-22 MED ORDER — LISINOPRIL 2.5 MG PO TABS: 2.5000 mg | ORAL_TABLET | Freq: Every day | ORAL | 1 refills | Status: DC

## 2019-07-22 NOTE — Telephone Encounter (Signed)
Please contact pt and confirm that she picked up the xarelto samples at the front desk yesterday.  She should complete her starter pack then begin the samples while we wait on pt assistance.  The samples are 10mg  instead of 20mg .  She will need to take 2 of the sample pills together each day.

## 2019-07-22 NOTE — Telephone Encounter (Signed)
Pt states she still had a week left of her prescription from the hospital. She states she has not started taking the samples yet but she did receive them. Please advise.

## 2019-07-22 NOTE — Telephone Encounter (Signed)
Notified pt of MD instructions, to expect a call from GI regarding referral placed by MD. Pt verbalized understanding.

## 2019-07-22 NOTE — Progress Notes (Signed)
Cardiology Office Note:    Date:  07/22/2019   ID:  Ruth Jensen, DOB 04/29/1951, MRN [id number]  PCP:  [name], NP  Cardiologist:  Berniece Salines, DO  Electrophysiologist:  None   Referring MD: [name], NP     History of Present Illness:    Ruth Jensen is a 68 y.o. 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  The patient was referred by her PCP after an echocardiogram done while she was in the hospital showed LVEF of 40 to 45% with worsening hypokinesis in her inferior wall.  While talking to the patient she tells me that she also has been experiencing intermittent chest pain several months prior to her even having her breath that led her to the hospital to be evaluated for pulmonary embolism.  She described the pain as a left-sided dullness which radiated up to her shoulder last for few minutes prior to resolution.  She still has this intermittently post her acute pulmonary embolism diagnosis and treatment.  She admits to been associated shortness of breath.  No other complaints at this time.  Past Medical History:  Diagnosis Date   DVT (deep venous thrombosis) ([place]) 07/01/2019   Pulmonary embolism ([place]) 07/02/2019    Past Surgical History:  Procedure Laterality Date   ABDOMINAL HYSTERECTOMY  2010   due to fibroids    Current Medications: Current Meds  Medication Sig   Multiple Vitamin (MULTIVITAMIN WITH MINERALS) TABS tablet Take 1 tablet by mouth daily.   rivaroxaban (XARELTO) 20 MG TABS tablet Take 1 tablet (20 mg total) by mouth daily with supper.     Allergies:   Patient has no known allergies.   Social History   Socioeconomic History   Marital status: Married    Spouse name: Ruth Jensen   Number of children: 2   Years of education: Not on file   Highest education level: Not on file  Occupational History   Occupation: retired     Scientist, product/process development strain: Not hard at [employer]    Worry: Never true    Inability: Never true   Transportation needs    Medical: No    Non-medical: No  Tobacco Use   Smoking status: Never Smoker   Smokeless tobacco: Never Used  Substance and Sexual Activity   Alcohol use: Not Currently   Drug use: Never   Sexual activity: Not Currently  Lifestyle   Physical activity    Days per week: 0 days    Minutes per session: Not on file   Stress: Not on file  Relationships   Social connections    Talks on phone: More than three times a week    Gets together: Never    Attends religious service: Never    Active member of club or organization: No    Attends meetings of clubs or organizations: Never    Relationship status: Married  Other Topics Concern   Not on file  Social History Narrative   Not on file     Family History: The patient's family history includes Asthma in her paternal grandfather; CAD in her sister; CVA in her mother; Dementia in her father; Hypertension in her daughter, father, mother, sister, sister, sister, and son; Parkinson's disease in her sister; Skin cancer in her sister.  ROS:   Review of Systems  Constitution: Negative for decreased appetite,  fever and weight gain.  HENT: Negative for congestion, ear discharge, hoarse voice and sore throat.   Eyes: Negative for discharge, redness, vision loss in right eye and visual halos.  Cardiovascular: Negative for chest pain, dyspnea on exertion, leg swelling, orthopnea and palpitations.  Respiratory: Negative for cough, hemoptysis, shortness of breath and snoring.   Endocrine: Negative for heat intolerance and polyphagia.  Hematologic/Lymphatic: Negative for bleeding problem. Does not bruise/bleed easily.  Skin: Negative for flushing, nail changes, rash and suspicious lesions.  Musculoskeletal: Negative for arthritis, joint pain, muscle cramps, myalgias, neck pain and  stiffness.  Gastrointestinal: Negative for abdominal pain, bowel incontinence, diarrhea and excessive appetite.  Genitourinary: Negative for decreased libido, genital sores and incomplete emptying.  Neurological: Negative for brief paralysis, focal weakness, headaches and loss of balance.  Psychiatric/Behavioral: Negative for altered mental status, depression and suicidal ideas.  Allergic/Immunologic: Negative for HIV exposure and persistent infections.    EKGs/Labs/Other Studies Reviewed:    The following studies were reviewed today:   EKG:  The ekg ordered today demonstrates sinus rhythm, heart rate 99 bpm with PACs and occasional PVCs.  There is QS pattern in the inferior leads suggestive of old inferior wall infarction.  And ST segment changes in the lateral wall concerning for ischemia, compared to ECG done on 07/01/2019 PVCs and QS pattern were also present at that time.  Transthoracic echocardiogram performed on 07/02/2019 IMPRESSIONS  1. Left ventricular ejection fraction, by visual estimation, is 40 to 45%. The left ventricle has mildly decreased function. There is mildly increased left ventricular hypertrophy. Diffuse hypokinesis, inferior wall looks regionally worse.  2. Left ventricular diastolic parameters are consistent with Grade I diastolic dysfunction (impaired relaxation).  3. Global right ventricle has mildly reduced systolic function.The right ventricular size is normal. No increase in right ventricular wall thickness.  4. Left atrial size was mildly dilated.  5. Right atrial size was mildly dilated.  6. The mitral valve is normal in structure. Trace mitral valve regurgitation. No evidence of mitral stenosis.  7. The tricuspid valve is normal in structure. Tricuspid valve regurgitation is trivial.  8. The aortic valve is tricuspid. Aortic valve regurgitation is not visualized. No evidence of aortic valve sclerosis or stenosis.  9. The inferior vena cava is normal in size  with greater than 50% respiratory variability, suggesting right atrial pressure of 3 mmHg. 10. The tricuspid regurgitant velocity is 2.33 m/s, and with an assumed right atrial pressure of 3 mmHg, the estimated right ventricular systolic pressure is normal at 24.7 mmHg.   Recent Labs: 07/01/2019: B Natriuretic Peptide 74.6 07/03/2019: Magnesium 2.2 07/21/2019: ALT 7; BUN 10; Creatinine 0.74; Hemoglobin 11.1; Platelet Count 365; Potassium 3.6; Sodium 144  Recent Lipid Panel No results found for: CHOL, TRIG, HDL, CHOLHDL, VLDL, LDLCALC, LDLDIRECT  Physical Exam:    VS:  BP 120/84 (BP Location: Right Arm, Patient Position: Sitting, Cuff Size: Large)    Pulse 95    Ht 5\' 2"  (1.575 m)    Wt 195 lb (88.5 kg)    SpO2 98%    BMI 35.67 kg/m     Wt Readings from Last 3 Encounters:  07/22/19 195 lb (88.5 kg)  07/21/19 194 lb 1.9 oz (88.1 kg)  07/21/19 195 lb (88.5 kg)     GEN: Well nourished, well developed in no acute distress HEENT: Normal NECK: No JVD; No carotid bruits LYMPHATICS: No lymphadenopathy CARDIAC: S1S2 noted,RRR, no murmurs, rubs, gallops RESPIRATORY:  Clear to auscultation without rales, wheezing or  rhonchi  ABDOMEN: Soft, non-tender, non-distended, +bowel sounds, no guarding. EXTREMITIES: No edema, No cyanosis, no clubbing MUSCULOSKELETAL:  No edema; No deformity  SKIN: Warm and dry NEUROLOGIC:  Alert and oriented x 3, non-focal PSYCHIATRIC:  Normal affect, good insight  ASSESSMENT:    1. Abnormal echocardiogram   2. Chest pain, unspecified type   3. Abnormal EKG   4. Lipid screening   5. Pre-procedure lab exam   6. Depressed left ventricular ejection fraction   7. Obesity (BMI 30-39.9)    PLAN:     I was able to discuss echo findings as well as her EKG finding with the patient in the office today.  The wall motion abnormality in her inferior wall which correlates with her EKG finding and concern for coronary artery disease, therefore at this time going to pursue  left heart catheterization in this patient.  She denies any IV contrast dye allergies.  I have educated the patient about this procedure and the patient understands that risks include but are not limited to stroke (1 in 1000), death (1 in 60), kidney failure [usually temporary] (1 in 500), bleeding (1 in 200), allergic reaction [possibly serious] (1 in 200), and agrees to proceed.  I was able to discuss with the patient to start her on medications that would help her mortality and also hopefully help with myocardial recovery.  She was started on Toprol-XL 12.5 mg daily, lisinopril 2.5 milligrams daily.  She was recently started on Xarelto is still being loaded her DVT and PE treatment.  She is taking Xarelto 50 mg twice a day and in a week we will start on 20 mg daily.  I have discussed with her that she may need to hold her Xarelto for about 2 doses prior to her left heart catheterization.  I am holding off on starting on her aspirin 81 mg daily as in the Xarelto.  Loading her risk of bleeding is very high so ideally at the time of catheterization if she does have proven coronary artery disease and required a stent we can load her at the time of her PCI.  I discussed with the patient that starting a statin especially in the setting if she has coronary disease which is highly suggestive based on her objective finding on echocardiogram in electrocardiogram will need to start her on a statin medication.  For now she is hesitant therefore I am going to also get a lipid profile.  The patient understands the need to lose weight with diet and exercise. We have discussed specific strategies for this.     The patient is in agreement with the above plan. The patient left the office in stable condition.  The patient will follow up in 1 month or sooner if needed.   Medication Adjustments/Labs and Tests Ordered: Current medicines are reviewed at length with the patient today.  Concerns regarding medicines are  outlined above.  Orders Placed This Encounter  Procedures   Lipid Profile   Basic Metabolic Panel (BMET)   CBC   EKG 12-Lead   Meds ordered this encounter  Medications   metoprolol succinate (TOPROL XL) 25 MG 24 hr tablet    Sig: Take 0.5 tablets (12.5 mg total) by mouth daily.    Dispense:  45 tablet    Refill:  1   lisinopril (ZESTRIL) 2.5 MG tablet    Sig: Take 1 tablet (2.5 mg total) by mouth daily.    Dispense:  90 tablet    Refill:  1   nitroGLYCERIN (NITROSTAT) 0.4 MG SL tablet    Sig: Place 1 tablet (0.4 mg total) under the tongue every 5 (five) minutes as needed.    Dispense:  30 tablet    Refill:  3    Patient Instructions  Medication Instructions:  Your physician has recommended you make the following change in your medication:   START Lisinopril 2.5 mg Take 1 tab daily START: Toprol XL(metoprolol succinate) 25 mg Take 1/2 tab daily  Nitroglycerin 0.4 mg sublingual (under your tongue) as needed for chest pain. If experiencing chest pain, stop what you are doing and sit down. Take 1 nitroglycerin and wait 5 minutes. If chest pain continues, take another nitroglycerin and wait 5 minutes. If chest pain does not subside, take 1 more nitroglycerin and dial 911. You make take a total of 3 nitroglycerin in a 15 minute time frame.   *If you need a refill on your cardiac medications before your next appointment, please call your pharmacy*  Lab Work: Your physician recommends that you return for lab work in: [place] CBC,BMP,LIPID  If you have labs (blood work) drawn today and your tests are completely normal, you will receive your results only by:  [place] (if you have [place]) OR  A paper copy in the mail If you have any lab test that is abnormal or we need to change your treatment, we will call you to review the results.  Testing/Procedures:    [facility] [place], [place] [address] Dept: [phone] Loc: [phone]  LABRIA SEAMANS  07/22/2019  You are scheduled for a Cardiac Catheterization on Monday, December 7 with Dr. Quay Burow.  1. Please arrive at the [facility] (Main Entrance A) at [facility]: [address] [place], [place] [address] at 9:30 AM (This time is two hours before your procedure to ensure your preparation). Free valet parking service is available.   Special note: Every effort is made to have your procedure done on time. Please understand that emergencies sometimes delay scheduled procedures.  2. Diet: Do not eat solid foods after midnight.  The patient may have clear liquids until 5am upon the day of the procedure.  3. Labs: You will need to have a COVID screen done. Your appointment is Friday Dec 4 at 11:05 AM at [facility]  4. Medication instructions in preparation for your procedure:   Contrast Allergy: No    Stop taking Xarelto (Rivaroxaban) on Saturday, December 5.  On the morning of your procedure, take your  and any morning medicines NOT listed above.  You may use sips of water.  5. Plan for one night stay--bring personal belongings. 6. Bring a current list of your medications and current insurance cards. 7. You MUST have a responsible person to drive you home. 8. Someone MUST be with you the first 24 hours after you arrive home or your discharge will be delayed. 9. Please wear clothes that are easy to get on and off and wear slip-on shoes.  Thank you for allowing [place] to care for you!   -- [facility]  Follow-Up: At [facility], you and your health needs are our priority.  As part of our continuing mission to provide you with exceptional heart care, we have created designated Provider Care Teams.  These Care Teams include your primary Cardiologist (physician) and Advanced Practice  Providers (APPs -  Physician Assistants and  Nurse Practitioners) who all work together to provide you with the care you need, when you need it.  Your next appointment:   1  month(s)  The format for your next appointment:   In Person  Provider:   Berniece Salines, DO  Other Instructions Nitroglycerin sublingual tablets What is this medicine? NITROGLYCERIN (nye troe GLI ser in) is a type of vasodilator. It relaxes blood vessels, increasing the blood and oxygen supply to your heart. This medicine is used to relieve chest pain caused by angina. It is also used to prevent chest pain before activities like climbing stairs, going outdoors in cold weather, or sexual activity. This medicine may be used for other purposes; ask your health care provider or pharmacist if you have questions. COMMON BRAND NAME(S): Nitroquick, Nitrostat, Nitrotab What should I tell my health care provider before I take this medicine? They need to know if you have any of these conditions:  anemia  head injury, recent stroke, or bleeding in the brain  liver disease  previous heart attack  an unusual or allergic reaction to nitroglycerin, other medicines, foods, dyes, or preservatives  pregnant or trying to get pregnant  breast-feeding How should I use this medicine? Take this medicine by mouth as needed. At the first sign of an angina attack (chest pain or tightness) place one tablet under your tongue. You can also take this medicine 5 to 10 minutes before an event likely to produce chest pain. Follow the directions on the prescription label. Let the tablet dissolve under the tongue. Do not swallow whole. Replace the dose if you accidentally swallow it. It will help if your mouth is not dry. Saliva around the tablet will help it to dissolve more quickly. Do not eat or drink, smoke or chew tobacco while a tablet is dissolving. If you are not better within 5 minutes after taking ONE dose of nitroglycerin, call 9-1-1 immediately to seek emergency medical care. Do not  take more than 3 nitroglycerin tablets over 15 minutes. If you take this medicine often to relieve symptoms of angina, your doctor or health care professional may provide you with different instructions to manage your symptoms. If symptoms do not go away after following these instructions, it is important to call 9-1-1 immediately. Do not take more than 3 nitroglycerin tablets over 15 minutes. Talk to your pediatrician regarding the use of this medicine in children. Special care may be needed. Overdosage: If you think you have taken too much of this medicine contact a poison control center or emergency room at once. NOTE: This medicine is only for you. Do not share this medicine with others. What if I miss a dose? This does not apply. This medicine is only used as needed. What may interact with this medicine? Do not take this medicine with any of the following medications:  certain migraine medicines like ergotamine and dihydroergotamine (DHE)  medicines used to treat erectile dysfunction like sildenafil, tadalafil, and vardenafil  riociguat This medicine may also interact with the following medications:  alteplase  aspirin  heparin  medicines for high blood pressure  medicines for mental depression  other medicines used to treat angina  phenothiazines like chlorpromazine, mesoridazine, prochlorperazine, thioridazine This list may not describe all possible interactions. Give your health care provider a list of all the medicines, herbs, non-prescription drugs, or dietary supplements you use. Also tell them if you smoke, drink alcohol, or use illegal drugs. Some items may  interact with your medicine. What should I watch for while using this medicine? Tell your doctor or health care professional if you feel your medicine is no longer working. Keep this medicine with you at all times. Sit or lie down when you take your medicine to prevent falling if you feel dizzy or faint after using  it. Try to remain calm. This will help you to feel better faster. If you feel dizzy, take several deep breaths and lie down with your feet propped up, or bend forward with your head resting between your knees. You may get drowsy or dizzy. Do not drive, use machinery, or do anything that needs mental alertness until you know how this drug affects you. Do not stand or sit up quickly, especially if you are an older patient. This reduces the risk of dizzy or fainting spells. Alcohol can make you more drowsy and dizzy. Avoid alcoholic drinks. Do not treat yourself for coughs, colds, or pain while you are taking this medicine without asking your doctor or health care professional for advice. Some ingredients may increase your blood pressure. What side effects may I notice from receiving this medicine? Side effects that you should report to your doctor or health care professional as soon as possible:  blurred vision  dry mouth  skin rash  sweating  the feeling of extreme pressure in the head  unusually weak or tired Side effects that usually do not require medical attention (report to your doctor or health care professional if they continue or are bothersome):  flushing of the face or neck  headache  irregular heartbeat, palpitations  nausea, vomiting This list may not describe all possible side effects. Call your doctor for medical advice about side effects. You may report side effects to FDA at 1-800-FDA-1088. Where should I keep my medicine? Keep out of the reach of children. Store at room temperature between 20 and 25 degrees C (68 and 77 degrees F). Store in [facility]. Protect from light and moisture. Keep tightly closed. Throw away any unused medicine after the expiration date. NOTE: This sheet is a summary. It may not cover all possible information. If you have questions about this medicine, talk to your doctor, pharmacist, or health care provider.  2020 Elsevier/Gold  Standard (2013-06-11 17:57:36) Metoprolol extended-release tablets What is this medicine? METOPROLOL (me TOE proe lole) is a beta-blocker. Beta-blockers reduce the workload on the heart and help it to beat more regularly. This medicine is used to treat high blood pressure and to prevent chest pain. It is also used to after a heart attack and to prevent an additional heart attack from occurring. This medicine may be used for other purposes; ask your health care provider or pharmacist if you have questions. COMMON BRAND NAME(S): toprol, Toprol XL What should I tell my health care provider before I take this medicine? They need to know if you have any of these conditions:  diabetes  heart or vessel disease like slow heart rate, worsening heart failure, heart block, sick sinus syndrome or Raynaud's disease  kidney disease  liver disease  lung or breathing disease, like asthma or emphysema  pheochromocytoma  thyroid disease  an unusual or allergic reaction to metoprolol, other beta-blockers, medicines, foods, dyes, or preservatives  pregnant or trying to get pregnant  breast-feeding How should I use this medicine? Take this medicine by mouth with a glass of water. Follow the directions on the prescription label. Do not crush or chew. Take this medicine with  or immediately after meals. Take your doses at regular intervals. Do not take more medicine than directed. Do not stop taking this medicine suddenly. This could lead to serious heart-related effects. Talk to your pediatrician regarding the use of this medicine in children. While this drug may be prescribed for children as young as 6 years for selected conditions, precautions do apply. Overdosage: If you think you have taken too much of this medicine contact a poison control center or emergency room at once. NOTE: This medicine is only for you. Do not share this medicine with others. What if I miss a dose? If you miss a dose, take it  as soon as you can. If it is almost time for your next dose, take only that dose. Do not take double or extra doses. What may interact with this medicine? This medicine may interact with the following medications:  certain medicines for blood pressure, heart disease, irregular heart beat  certain medicines for depression, like monoamine oxidase (MAO) inhibitors, fluoxetine, or paroxetine  clonidine  dobutamine  epinephrine  isoproterenol  reserpine This list may not describe all possible interactions. Give your health care provider a list of all the medicines, herbs, non-prescription drugs, or dietary supplements you use. Also tell them if you smoke, drink alcohol, or use illegal drugs. Some items may interact with your medicine. What should I watch for while using this medicine? Visit your doctor or health care professional for regular check ups. Contact your doctor right away if your symptoms worsen. Check your blood pressure and pulse rate regularly. Ask your health care professional what your blood pressure and pulse rate should be, and when you should contact them. You may get drowsy or dizzy. Do not drive, use machinery, or do anything that needs mental alertness until you know how this medicine affects you. Do not sit or stand up quickly, especially if you are an older patient. This reduces the risk of dizzy or fainting spells. Contact your doctor if these symptoms continue. Alcohol may interfere with the effect of this medicine. Avoid alcoholic drinks. This medicine may increase blood sugar. Ask your healthcare provider if changes in diet or medicines are needed if you have diabetes. What side effects may I notice from receiving this medicine? Side effects that you should report to your doctor or health care professional as soon as possible:  allergic reactions like skin rash, itching or hives  cold or numb hands or feet  depression  difficulty breathing  faint  fever with  sore throat  irregular heartbeat, chest pain  rapid weight gain   signs and symptoms of high blood sugar such as being more thirsty or hungry or having to urinate more than normal. You may also feel very tired or have blurry vision.  swollen legs or ankles Side effects that usually do not require medical attention (report to your doctor or health care professional if they continue or are bothersome):  anxiety or nervousness  change in sex drive or performance  dry skin  headache  nightmares or trouble sleeping  short term memory loss  stomach upset or diarrhea This list may not describe all possible side effects. Call your doctor for medical advice about side effects. You may report side effects to FDA at 1-800-FDA-1088. Where should I keep my medicine? Keep out of the reach of children. Store at room temperature between 15 and 30 degrees C (59 and 86 degrees F). Throw away any unused medicine after the expiration date. NOTE:  This sheet is a summary. It may not cover all possible information. If you have questions about this medicine, talk to your doctor, pharmacist, or health care provider.  2020 [employer]/Gold Standard (2018-06-03 11:09:41) Lisinopril tablets What is this medicine? LISINOPRIL (lyse [place] oh pril) is an ACE inhibitor. This medicine is used to treat high blood pressure and heart failure. It is also used to protect the heart immediately after a heart attack. This medicine may be used for other purposes; ask your health care provider or pharmacist if you have questions. COMMON BRAND NAME(S): Prinivil, Zestril What should I tell my health care provider before I take this medicine? They need to know if you have any of these conditions:  diabetes  heart or blood vessel disease  kidney disease  low blood pressure  previous swelling of the tongue, face, or lips with difficulty breathing, difficulty swallowing, hoarseness, or tightening of the throat  an unusual  or allergic reaction to lisinopril, other ACE inhibitors, insect venom, foods, dyes, or preservatives  pregnant or trying to get pregnant  breast-feeding How should I use this medicine? Take this medicine by mouth with a glass of water. Follow the directions on your prescription label. You may take this medicine with or without food. If it upsets your stomach, take it with food. Take your medicine at regular intervals. Do not take it more often than directed. Do not stop taking except on your doctor's advice. Talk to your pediatrician regarding the use of this medicine in children. Special care may be needed. While this drug may be prescribed for children as young as 55 years of age for selected conditions, precautions do apply. Overdosage: If you think you have taken too much of this medicine contact a poison control center or emergency room at once. NOTE: This medicine is only for you. Do not share this medicine with others. What if I miss a dose? If you miss a dose, take it as soon as you can. If it is almost time for your next dose, take only that dose. Do not take double or extra doses. What may interact with this medicine? Do not take this medicine with any of the following medications:  hymenoptera venom  sacubitril; valsartan This medicines may also interact with the following medications:  aliskiren  angiotensin receptor blockers, like losartan or valsartan  certain medicines for diabetes  diuretics  everolimus  gold compounds  lithium  NSAIDs, medicines for pain and inflammation, like ibuprofen or naproxen  potassium salts or supplements  salt substitutes  sirolimus  temsirolimus This list may not describe all possible interactions. Give your health care provider a list of all the medicines, herbs, non-prescription drugs, or dietary supplements you use. Also tell them if you smoke, drink alcohol, or use illegal drugs. Some items may interact with your  medicine. What should I watch for while using this medicine? Visit your doctor or health care professional for regular check ups. Check your blood pressure as directed. Ask your doctor what your blood pressure should be, and when you should contact him or her. Do not treat yourself for coughs, colds, or pain while you are using this medicine without asking your doctor or health care professional for advice. Some ingredients may increase your blood pressure. Women should inform their doctor if they wish to become pregnant or think they might be pregnant. There is a potential for serious side effects to an unborn child. Talk to your health care professional or pharmacist for more  information. Check with your doctor or health care professional if you get an attack of severe diarrhea, nausea and vomiting, or if you sweat a lot. The loss of too much body fluid can make it dangerous for you to take this medicine. You may get drowsy or dizzy. Do not drive, use machinery, or do anything that needs mental alertness until you know how this drug affects you. Do not stand or sit up quickly, especially if you are an older patient. This reduces the risk of dizzy or fainting spells. Alcohol can make you more drowsy and dizzy. Avoid alcoholic drinks. Avoid salt substitutes unless you are told otherwise by your doctor or health care professional. What side effects may I notice from receiving this medicine? Side effects that you should report to your doctor or health care professional as soon as possible:  allergic reactions like skin rash, itching or hives, swelling of the hands, feet, face, lips, throat, or tongue  breathing problems  signs and symptoms of kidney injury like trouble passing urine or change in the amount of urine  signs and symptoms of increased potassium like muscle weakness; chest pain; or fast, irregular heartbeat  signs and symptoms of liver injury like dark yellow or brown urine; general ill  feeling or flu-like symptoms; light-colored stools; loss of appetite; nausea; right upper belly pain; unusually weak or tired; yellowing of the eyes or skin  signs and symptoms of low blood pressure like dizziness; feeling faint or lightheaded, falls; unusually weak or tired  stomach pain with or without nausea and vomiting Side effects that usually do not require medical attention (report to your doctor or health care professional if they continue or are bothersome):  changes in taste  cough  dizziness  fever  headache  sensitivity to light This list may not describe all possible side effects. Call your doctor for medical advice about side effects. You may report side effects to FDA at 1-800-FDA-1088. Where should I keep my medicine? Keep out of the reach of children. Store at room temperature between 15 and 30 degrees C (59 and 86 degrees F). Protect from moisture. Keep container tightly closed. Throw away any unused medicine after the expiration date. NOTE: This sheet is a summary. It may not cover all possible information. If you have questions about this medicine, talk to your doctor, pharmacist, or health care provider.  2020 [employer]vier/Gold Standard (2015-10-03 12:52:35)      Adopting a Healthy Lifestyle.  Know what a healthy weight is for you (roughly BMI <25) and aim to maintain this   Aim for 7+ servings of fruits and vegetables daily   65-80+ fluid ounces of water or unsweet tea for healthy kidneys   Limit to max 1 drink of alcohol per day; avoid smoking/tobacco   Limit animal fats in diet for cholesterol and heart health - choose grass fed whenever available   Avoid highly processed foods, and foods high in saturated/trans fats   Aim for low stress - take time to unwind and care for your mental health   Aim for 150 min of moderate intensity exercise weekly for heart health, and weights twice weekly for bone health   Aim for 7-9 hours of sleep daily   When it  comes to diets, agreement about the perfect plan isnt easy to find, even among the experts. Experts at the [employer] developed an idea known as the Healthy Eating Plate. Just imagine a plate divided into logical, healthy  portions.   The emphasis is on diet quality:   Load up on vegetables and fruits - one-half of your plate: Aim for color and variety, and remember that potatoes dont count.   Go for whole grains - one-quarter of your plate: Whole wheat, barley, wheat berries, quinoa, oats, brown rice, and foods made with them. If you want pasta, go with whole wheat pasta.   Protein power - one-quarter of your plate: Fish, chicken, beans, and nuts are all healthy, versatile protein sources. Limit red meat.   The diet, however, does go beyond the plate, offering a few other suggestions.   Use healthy plant oils, such as olive, canola, soy, corn, sunflower and peanut. Check the labels, and avoid partially hydrogenated oil, which have unhealthy trans fats.   If youre thirsty, drink water. Coffee and tea are good in moderation, but skip sugary drinks and limit milk and dairy products to one or two daily servings.   The type of carbohydrate in the diet is more important than the amount. Some sources of carbohydrates, such as vegetables, fruits, whole grains, and beans-are healthier than others.   Finally, stay active  Signed, Berniece Salines, DO  07/22/2019 4:10 PM     [facility]

## 2019-07-22 NOTE — Telephone Encounter (Signed)
Lvm for patient to call back to confirm she started samples and taking 2 daily.

## 2019-07-22 NOTE — Patient Instructions (Signed)
Medication Instructions:  Your physician has recommended you make the following change in your medication:   START Lisinopril 2.5 mg Take 1 tab daily START: Toprol XL(metoprolol succinate) 25 mg Take 1/2 tab daily  Nitroglycerin 0.4 mg sublingual (under your tongue) as needed for chest pain. If experiencing chest pain, stop what you are doing and sit down. Take 1 nitroglycerin and wait 5 minutes. If chest pain continues, take another nitroglycerin and wait 5 minutes. If chest pain does not subside, take 1 more nitroglycerin and dial 911. You make take a total of 3 nitroglycerin in a 15 minute time frame.   *If you need a refill on your cardiac medications before your next appointment, please call your pharmacy*  Lab Work: Your physician recommends that you return for lab work in: TODAY CBC,BMP,LIPID  If you have labs (blood work) drawn today and your tests are completely normal, you will receive your results only by: Marland Kitchen MyChart Message (if you have MyChart) OR . A paper copy in the mail If you have any lab test that is abnormal or we need to change your treatment, we will call you to review the results.  Testing/Procedures:    [place], [place] [address] Dept: [phone] Loc: [phone]  Ruth Jensen  07/22/2019  You are scheduled for a Cardiac Catheterization on Monday, December 7 with Dr. Quay Burow.  1. Please arrive at the [facility] (Main Entrance A) at [facility]: [address] [place], [place] [address] at 9:30 AM (This time is two hours before your procedure to ensure your preparation). Free valet parking service is available.   Special note: Every effort is made to have your procedure done on time. Please understand that emergencies sometimes delay scheduled procedures.  2. Diet: Do not eat solid foods after midnight.  The patient may have  clear liquids until 5am upon the day of the procedure.  3. Labs: You will need to have a COVID screen done. Your appointment is Friday Dec 4 at 11:05 AM at [place]  4. Medication instructions in preparation for your procedure:   Contrast Allergy: No    Stop taking Xarelto (Rivaroxaban) on Saturday, December 5.  On the morning of your procedure, take your  and any morning medicines NOT listed above.  You may use sips of water.  5. Plan for one night stay--bring personal belongings. 6. Bring a current list of your medications and current insurance cards. 7. You MUST have a responsible person to drive you home. 8. Someone MUST be with you the first 24 hours after you arrive home or your discharge will be delayed. 9. Please wear clothes that are easy to get on and off and wear slip-on shoes.  Thank you for allowing [place] to care for you!   -- [facility]  Follow-Up: At [facility], you and your health needs are our priority.  As part of our continuing mission to provide you with exceptional heart care, we have created designated Provider Care Teams.  These Care Teams include your primary Cardiologist (physician) and Advanced Practice Providers (APPs -  Physician Assistants and Nurse Practitioners) who all work together to provide you with the care you need, when you need it.  Your next appointment:   1  month(s)  The format for your next appointment:   In Person  Provider:  Kardie Tobb, DO  Other Instructions Nitroglycerin sublingual tablets What is this medicine? NITROGLYCERIN (nye troe GLI ser in) is a type of vasodilator. It relaxes blood vessels, increasing the blood and oxygen supply to your heart. This medicine is used to relieve chest pain caused by angina. It is also used to prevent chest pain before activities like climbing stairs, going outdoors in cold weather, or sexual activity. This medicine may be used for  other purposes; ask your health care provider or pharmacist if you have questions. COMMON BRAND NAME(S): Nitroquick, Nitrostat, Nitrotab What should I tell my health care provider before I take this medicine? They need to know if you have any of these conditions:  anemia  head injury, recent stroke, or bleeding in the brain  liver disease  previous heart attack  an unusual or allergic reaction to nitroglycerin, other medicines, foods, dyes, or preservatives  pregnant or trying to get pregnant  breast-feeding How should I use this medicine? Take this medicine by mouth as needed. At the first sign of an angina attack (chest pain or tightness) place one tablet under your tongue. You can also take this medicine 5 to 10 minutes before an event likely to produce chest pain. Follow the directions on the prescription label. Let the tablet dissolve under the tongue. Do not swallow whole. Replace the dose if you accidentally swallow it. It will help if your mouth is not dry. Saliva around the tablet will help it to dissolve more quickly. Do not eat or drink, smoke or chew tobacco while a tablet is dissolving. If you are not better within 5 minutes after taking ONE dose of nitroglycerin, call 9-1-1 immediately to seek emergency medical care. Do not take more than 3 nitroglycerin tablets over 15 minutes. If you take this medicine often to relieve symptoms of angina, your doctor or health care professional may provide you with different instructions to manage your symptoms. If symptoms do not go away after following these instructions, it is important to call 9-1-1 immediately. Do not take more than 3 nitroglycerin tablets over 15 minutes. Talk to your pediatrician regarding the use of this medicine in children. Special care may be needed. Overdosage: If you think you have taken too much of this medicine contact a poison control center or emergency room at once. NOTE: This medicine is only for you. Do not  share this medicine with others. What if I miss a dose? This does not apply. This medicine is only used as needed. What may interact with this medicine? Do not take this medicine with any of the following medications:  certain migraine medicines like ergotamine and dihydroergotamine (DHE)  medicines used to treat erectile dysfunction like sildenafil, tadalafil, and vardenafil  riociguat This medicine may also interact with the following medications:  alteplase  aspirin  heparin  medicines for high blood pressure  medicines for mental depression  other medicines used to treat angina  phenothiazines like chlorpromazine, mesoridazine, prochlorperazine, thioridazine This list may not describe all possible interactions. Give your health care provider a list of all the medicines, herbs, non-prescription drugs, or dietary supplements you use. Also tell them if you smoke, drink alcohol, or use illegal drugs. Some items may interact with your medicine. What should I watch for while using this medicine? Tell your doctor or health care professional if you feel your medicine is no longer working. Keep this medicine with you at all times. Sit or lie down when you take your medicine to prevent falling  if you feel dizzy or faint after using it. Try to remain calm. This will help you to feel better faster. If you feel dizzy, take several deep breaths and lie down with your feet propped up, or bend forward with your head resting between your knees. You may get drowsy or dizzy. Do not drive, use machinery, or do anything that needs mental alertness until you know how this drug affects you. Do not stand or sit up quickly, especially if you are an older patient. This reduces the risk of dizzy or fainting spells. Alcohol can make you more drowsy and dizzy. Avoid alcoholic drinks. Do not treat yourself for coughs, colds, or pain while you are taking this medicine without asking your doctor or health care  professional for advice. Some ingredients may increase your blood pressure. What side effects may I notice from receiving this medicine? Side effects that you should report to your doctor or health care professional as soon as possible:  blurred vision  dry mouth  skin rash  sweating  the feeling of extreme pressure in the head  unusually weak or tired Side effects that usually do not require medical attention (report to your doctor or health care professional if they continue or are bothersome):  flushing of the face or neck  headache  irregular heartbeat, palpitations  nausea, vomiting This list may not describe all possible side effects. Call your doctor for medical advice about side effects. You may report side effects to FDA at 1-800-FDA-1088. Where should I keep my medicine? Keep out of the reach of children. Store at room temperature between 20 and 25 degrees C (68 and 77 degrees F). Store in [facility] [employer]. Protect from light and moisture. Keep tightly closed. Throw away any unused medicine after the expiration date. NOTE: This sheet is a summary. It may not cover all possible information. If you have questions about this medicine, talk to your doctor, pharmacist, or health care provider.  2020 Elsevier/Gold Standard (2013-06-11 17:57:36) Metoprolol extended-release tablets What is this medicine? METOPROLOL (me TOE proe lole) is a beta-blocker. Beta-blockers reduce the workload on the heart and help it to beat more regularly. This medicine is used to treat high blood pressure and to prevent chest pain. It is also used to after a heart attack and to prevent an additional heart attack from occurring. This medicine may be used for other purposes; ask your health care provider or pharmacist if you have questions. COMMON BRAND NAME(S): toprol, Toprol XL What should I tell my health care provider before I take this medicine? They need to know if you have any of these  conditions:  diabetes  heart or vessel disease like slow heart rate, worsening heart failure, heart block, sick sinus syndrome or Raynaud's disease  kidney disease  liver disease  lung or breathing disease, like asthma or emphysema  pheochromocytoma  thyroid disease  an unusual or allergic reaction to metoprolol, other beta-blockers, medicines, foods, dyes, or preservatives  pregnant or trying to get pregnant  breast-feeding How should I use this medicine? Take this medicine by mouth with a glass of water. Follow the directions on the prescription label. Do not crush or chew. Take this medicine with or immediately after meals. Take your doses at regular intervals. Do not take more medicine than directed. Do not stop taking this medicine suddenly. This could lead to serious heart-related effects. Talk to your pediatrician regarding the use of this medicine in children. While this drug may be prescribed  for children as young as 6 years for selected conditions, precautions do apply. Overdosage: If you think you have taken too much of this medicine contact a poison control center or emergency room at once. NOTE: This medicine is only for you. Do not share this medicine with others. What if I miss a dose? If you miss a dose, take it as soon as you can. If it is almost time for your next dose, take only that dose. Do not take double or extra doses. What may interact with this medicine? This medicine may interact with the following medications:  certain medicines for blood pressure, heart disease, irregular heart beat  certain medicines for depression, like monoamine oxidase (MAO) inhibitors, fluoxetine, or paroxetine  clonidine  dobutamine  epinephrine  isoproterenol  reserpine This list may not describe all possible interactions. Give your health care provider a list of all the medicines, herbs, non-prescription drugs, or dietary supplements you use. Also tell them if you  smoke, drink alcohol, or use illegal drugs. Some items may interact with your medicine. What should I watch for while using this medicine? Visit your doctor or health care professional for regular check ups. Contact your doctor right away if your symptoms worsen. Check your blood pressure and pulse rate regularly. Ask your health care professional what your blood pressure and pulse rate should be, and when you should contact them. You may get drowsy or dizzy. Do not drive, use machinery, or do anything that needs mental alertness until you know how this medicine affects you. Do not sit or stand up quickly, especially if you are an older patient. This reduces the risk of dizzy or fainting spells. Contact your doctor if these symptoms continue. Alcohol may interfere with the effect of this medicine. Avoid alcoholic drinks. This medicine may increase blood sugar. Ask your healthcare provider if changes in diet or medicines are needed if you have diabetes. What side effects may I notice from receiving this medicine? Side effects that you should report to your doctor or health care professional as soon as possible:  allergic reactions like skin rash, itching or hives  cold or numb hands or feet  depression  difficulty breathing  faint  fever with sore throat  irregular heartbeat, chest pain  rapid weight gain   signs and symptoms of high blood sugar such as being more thirsty or hungry or having to urinate more than normal. You may also feel very tired or have blurry vision.  swollen legs or ankles Side effects that usually do not require medical attention (report to your doctor or health care professional if they continue or are bothersome):  anxiety or nervousness  change in sex drive or performance  dry skin  headache  nightmares or trouble sleeping  short term memory loss  stomach upset or diarrhea This list may not describe all possible side effects. Call your doctor for  medical advice about side effects. You may report side effects to FDA at 1-800-FDA-1088. Where should I keep my medicine? Keep out of the reach of children. Store at room temperature between 15 and 30 degrees C (59 and 86 degrees F). Throw away any unused medicine after the expiration date. NOTE: This sheet is a summary. It may not cover all possible information. If you have questions about this medicine, talk to your doctor, pharmacist, or health care provider.  2020 Elsevier/Gold Standard (2018-06-03 11:09:41) Lisinopril tablets What is this medicine? LISINOPRIL (lyse IN oh pril) is an ACE inhibitor.  This medicine is used to treat high blood pressure and heart failure. It is also used to protect the heart immediately after a heart attack. This medicine may be used for other purposes; ask your health care provider or pharmacist if you have questions. COMMON BRAND NAME(S): Prinivil, Zestril What should I tell my health care provider before I take this medicine? They need to know if you have any of these conditions:  diabetes  heart or blood vessel disease  kidney disease  low blood pressure  previous swelling of the tongue, face, or lips with difficulty breathing, difficulty swallowing, hoarseness, or tightening of the throat  an unusual or allergic reaction to lisinopril, other ACE inhibitors, insect venom, foods, dyes, or preservatives  pregnant or trying to get pregnant  breast-feeding How should I use this medicine? Take this medicine by mouth with a glass of water. Follow the directions on your prescription label. You may take this medicine with or without food. If it upsets your stomach, take it with food. Take your medicine at regular intervals. Do not take it more often than directed. Do not stop taking except on your doctor's advice. Talk to your pediatrician regarding the use of this medicine in children. Special care may be needed. While this drug may be prescribed for  children as young as 39 years of age for selected conditions, precautions do apply. Overdosage: If you think you have taken too much of this medicine contact a poison control center or emergency room at once. NOTE: This medicine is only for you. Do not share this medicine with others. What if I miss a dose? If you miss a dose, take it as soon as you can. If it is almost time for your next dose, take only that dose. Do not take double or extra doses. What may interact with this medicine? Do not take this medicine with any of the following medications:  hymenoptera venom  sacubitril; valsartan This medicines may also interact with the following medications:  aliskiren  angiotensin receptor blockers, like losartan or valsartan  certain medicines for diabetes  diuretics  everolimus  gold compounds  lithium  NSAIDs, medicines for pain and inflammation, like ibuprofen or naproxen  potassium salts or supplements  salt substitutes  sirolimus  temsirolimus This list may not describe all possible interactions. Give your health care provider a list of all the medicines, herbs, non-prescription drugs, or dietary supplements you use. Also tell them if you smoke, drink alcohol, or use illegal drugs. Some items may interact with your medicine. What should I watch for while using this medicine? Visit your doctor or health care professional for regular check ups. Check your blood pressure as directed. Ask your doctor what your blood pressure should be, and when you should contact him or her. Do not treat yourself for coughs, colds, or pain while you are using this medicine without asking your doctor or health care professional for advice. Some ingredients may increase your blood pressure. Women should inform their doctor if they wish to become pregnant or think they might be pregnant. There is a potential for serious side effects to an unborn child. Talk to your health care professional or  pharmacist for more information. Check with your doctor or health care professional if you get an attack of severe diarrhea, nausea and vomiting, or if you sweat a lot. The loss of too much body fluid can make it dangerous for you to take this medicine. You may get drowsy or dizzy.  Do not drive, use machinery, or do anything that needs mental alertness until you know how this drug affects you. Do not stand or sit up quickly, especially if you are an older patient. This reduces the risk of dizzy or fainting spells. Alcohol can make you more drowsy and dizzy. Avoid alcoholic drinks. Avoid salt substitutes unless you are told otherwise by your doctor or health care professional. What side effects may I notice from receiving this medicine? Side effects that you should report to your doctor or health care professional as soon as possible:  allergic reactions like skin rash, itching or hives, swelling of the hands, feet, face, lips, throat, or tongue  breathing problems  signs and symptoms of kidney injury like trouble passing urine or change in the amount of urine  signs and symptoms of increased potassium like muscle weakness; chest pain; or fast, irregular heartbeat  signs and symptoms of liver injury like dark yellow or brown urine; general ill feeling or flu-like symptoms; light-colored stools; loss of appetite; nausea; right upper belly pain; unusually weak or tired; yellowing of the eyes or skin  signs and symptoms of low blood pressure like dizziness; feeling faint or lightheaded, falls; unusually weak or tired  stomach pain with or without nausea and vomiting Side effects that usually do not require medical attention (report to your doctor or health care professional if they continue or are bothersome):  changes in taste  cough  dizziness  fever  headache  sensitivity to light This list may not describe all possible side effects. Call your doctor for medical advice about side  effects. You may report side effects to FDA at 1-800-FDA-1088. Where should I keep my medicine? Keep out of the reach of children. Store at room temperature between 15 and 30 degrees C (59 and 86 degrees F). Protect from moisture. Keep container tightly closed. Throw away any unused medicine after the expiration date. NOTE: This sheet is a summary. It may not cover all possible information. If you have questions about this medicine, talk to your doctor, pharmacist, or health care provider.  2020 Elsevier/Gold Standard (2015-10-03 12:52:35)

## 2019-07-22 NOTE — Telephone Encounter (Signed)
Lvm again patient to be aware she needs to be on 2 of the 10mg  daily. Patient to call back to confirmed she received instructions.

## 2019-07-22 NOTE — Telephone Encounter (Signed)
-----   Message from [name], MD sent at 07/22/2019  9:49 AM EST ----- Graceann Congress, Can you let Ms. Hilger know that her iron is low, and she should take an OTC iron supplement daily? She will definitely need GI evaluation.  Thanks.  [place]  ----- Message ----- From: Buel Ream, Lab In [place] Sent: 07/21/2019  12:13 PM EST To: [name], MD

## 2019-07-23 LAB — BASIC METABOLIC PANEL
BUN/Creatinine Ratio: 15 (ref 12–28)
BUN: 11 mg/dL (ref 8–27)
CO2: 25 mmol/L (ref 20–29)
Calcium: 9.4 mg/dL (ref 8.7–10.3)
Chloride: 105 mmol/L (ref 96–106)
Creatinine, Ser: 0.74 mg/dL (ref 0.57–1.00)
GFR calc Af Amer: 96 mL/min/{1.73_m2} (ref >59)
GFR calc non Af Amer: 83 mL/min/{1.73_m2} (ref >59)
Glucose: 80 mg/dL (ref 65–99)
Potassium: 3.5 mmol/L (ref 3.5–5.2)
Sodium: 143 mmol/L (ref 134–144)

## 2019-07-23 LAB — CBC
Hematocrit: 32.3 % — ABNORMAL LOW (ref 34.0–46.6)
Hemoglobin: 10.7 g/dL — ABNORMAL LOW (ref 11.1–15.9)
MCH: 31.4 pg (ref 26.6–33.0)
MCHC: 33.1 g/dL (ref 31.5–35.7)
MCV: 95 fL (ref 79–97)
Platelets: 340 10*3/uL (ref 150–450)
RBC: 3.41 x10E6/uL — ABNORMAL LOW (ref 3.77–5.28)
RDW: 16.2 % — ABNORMAL HIGH (ref 11.7–15.4)
WBC: 7.1 10*3/uL (ref 3.4–10.8)

## 2019-07-23 LAB — LIPID PANEL
Chol/HDL Ratio: 4.6 ratio — ABNORMAL HIGH (ref 0.0–4.4)
Cholesterol, Total: 234 mg/dL — ABNORMAL HIGH (ref 100–199)
HDL: 51 mg/dL (ref >39)
LDL Chol Calc (NIH): 168 mg/dL — ABNORMAL HIGH (ref 0–99)
Triglycerides: 87 mg/dL (ref 0–149)
VLDL Cholesterol Cal: 15 mg/dL (ref 5–40)

## 2019-07-29 ENCOUNTER — Telehealth: Payer: Self-pay | Admitting: *Deleted

## 2019-07-29 MED ORDER — ATORVASTATIN CALCIUM 20 MG PO TABS: 20.0000 mg | ORAL_TABLET | Freq: Every day | ORAL | 1 refills | Status: DC

## 2019-07-29 NOTE — Telephone Encounter (Signed)
-----   Message from Berniece Salines, DO sent at 07/24/2019  1:43 PM EST ----- The LDL is elevated at 168, I would like you to start on a statin medication.  Lipitor 20 mg daily.

## 2019-07-29 NOTE — Telephone Encounter (Signed)
Telephone call to patient. Informed of labs and need to start Lipitor 20 mg daily. Pt verbalized understanding.

## 2019-07-29 NOTE — Telephone Encounter (Signed)
Spoke to patient and advised to take 2 of the 10 mg to stay on 20 mg daily. She verbalized understanding.

## 2019-07-30 ENCOUNTER — Other Ambulatory Visit: Payer: Self-pay

## 2019-07-30 ENCOUNTER — Telehealth: Payer: Self-pay | Admitting: *Deleted

## 2019-07-30 ENCOUNTER — Telehealth: Payer: Self-pay | Admitting: Cardiovascular Disease

## 2019-07-30 ENCOUNTER — Ambulatory Visit (INDEPENDENT_AMBULATORY_CARE_PROVIDER_SITE_OTHER): Payer: Medicare Other

## 2019-07-30 DIAGNOSIS — E538 Deficiency of other specified B group vitamins: Secondary | ICD-10-CM

## 2019-07-30 MED ORDER — CYANOCOBALAMIN 1000 MCG/ML IJ SOLN
1000.0000 ug | Freq: Once | INTRAMUSCULAR | Status: AC
  Administered 2019-07-30: 1000 ug via INTRAMUSCULAR

## 2019-07-30 NOTE — Telephone Encounter (Signed)
Informed pt only see 1 appt and that is for COVID testing  Per pt needs to contact Dr Maylon Peppers 's office  Re lab orders in system Pt agreed .Adonis Housekeeper

## 2019-07-30 NOTE — Telephone Encounter (Signed)
Pt contacted pre-catheterization scheduled at [facility] for: Monday August 03, 2019 11:30 AM Verified arrival time and place: [place] [facility]) at: 9:30 AM  No solid food after midnight prior to cath, clear liquids until 5 AM day of procedure. Contrast allergy: no  Hold: Xarelto-none 08/01/19 until post procedure  Except hold medications AM meds can be  taken pre-cath with sip of water including: ASA 81 mg   Confirmed patient has responsible adult to drive home post procedure and observe 24 hours after arriving home: yes  Currently, due to Covid-19 pandemic, only one support person will be allowed with patient. Must be the same support person for that patient's entire stay, will be screened and required to wear a mask. They will be asked to wait in the waiting room for the duration of the patient's stay.  Patients are required to wear a mask when they enter the hospital.      COVID-19 Pre-Screening Questions:  . In the past 7 to 10 days have you had a cough,  shortness of breath, headache, congestion, fever (100 or greater) body aches, chills, sore throat, or sudden loss of taste or sense of smell? no . Have you been around anyone with known Covid 19? no . Have you been around anyone who is awaiting Covid 19 test results in the past 7 to 10 days? no . Have you been around anyone who has been exposed to Covid 19, or has mentioned symptoms of Covid 19 within the past 7 to 10 days? no   I reviewed procedure/mask/visitor instructions, Covid-19 screening questions with patient, she verbalized understanding, thanked me for call.

## 2019-07-30 NOTE — Telephone Encounter (Signed)
New message:     Patient calling concering a covid test and some labs. Please call patient back.

## 2019-07-30 NOTE — Progress Notes (Signed)
Pt here for monthly B12 injection per Earlie Counts   B12 1081mcg given IM, and pt tolerated injection well.  Next B12 injection scheduled for  one month

## 2019-07-31 ENCOUNTER — Other Ambulatory Visit (HOSPITAL_COMMUNITY)
Admission: RE | Admit: 2019-07-31 | Discharge: 2019-07-31 | Disposition: A | Discharge status: Home / Self Care | Payer: Medicare Other | Source: Ambulatory Visit | Attending: Cardiovascular Disease | Admitting: Cardiovascular Disease

## 2019-07-31 DIAGNOSIS — Z01812 Encounter for preprocedural laboratory examination: Secondary | ICD-10-CM | POA: Insufficient documentation

## 2019-07-31 DIAGNOSIS — Z20828 Contact with and (suspected) exposure to other viral communicable diseases: Secondary | ICD-10-CM | POA: Insufficient documentation

## 2019-07-31 LAB — SARS CORONAVIRUS 2 (TAT 6-24 HRS): SARS Coronavirus 2: NEGATIVE

## 2019-08-03 ENCOUNTER — Ambulatory Visit (HOSPITAL_COMMUNITY)
Admission: RE | Admit: 2019-08-03 | Discharge: 2019-08-03 | Disposition: A | Discharge status: Home / Self Care | Payer: Medicare Other | Attending: Cardiovascular Disease | Admitting: Cardiovascular Disease

## 2019-08-03 ENCOUNTER — Encounter (HOSPITAL_COMMUNITY): Payer: Self-pay | Admitting: Cardiovascular Disease

## 2019-08-03 ENCOUNTER — Other Ambulatory Visit: Payer: Self-pay

## 2019-08-03 ENCOUNTER — Encounter (HOSPITAL_COMMUNITY)
Admission: RE | Disposition: A | Discharge status: Home / Self Care | Payer: Medicare Other | Source: Home / Self Care | Attending: Cardiovascular Disease

## 2019-08-03 DIAGNOSIS — Z6835 Body mass index (BMI) 35.0-35.9, adult: Secondary | ICD-10-CM | POA: Diagnosis not present

## 2019-08-03 DIAGNOSIS — R079 Chest pain, unspecified: Secondary | ICD-10-CM | POA: Diagnosis not present

## 2019-08-03 DIAGNOSIS — Z7901 Long term (current) use of anticoagulants: Secondary | ICD-10-CM | POA: Insufficient documentation

## 2019-08-03 DIAGNOSIS — I428 Other cardiomyopathies: Secondary | ICD-10-CM | POA: Insufficient documentation

## 2019-08-03 DIAGNOSIS — Z86718 Personal history of other venous thrombosis and embolism: Secondary | ICD-10-CM | POA: Diagnosis not present

## 2019-08-03 DIAGNOSIS — R931 Abnormal findings on diagnostic imaging of heart and coronary circulation: Secondary | ICD-10-CM

## 2019-08-03 DIAGNOSIS — E669 Obesity, unspecified: Secondary | ICD-10-CM | POA: Diagnosis not present

## 2019-08-03 DIAGNOSIS — Z86711 Personal history of pulmonary embolism: Secondary | ICD-10-CM | POA: Diagnosis not present

## 2019-08-03 DIAGNOSIS — R0989 Other specified symptoms and signs involving the circulatory and respiratory systems: Secondary | ICD-10-CM

## 2019-08-03 DIAGNOSIS — I519 Heart disease, unspecified: Secondary | ICD-10-CM | POA: Insufficient documentation

## 2019-08-03 DIAGNOSIS — R9431 Abnormal electrocardiogram [ECG] [EKG]: Secondary | ICD-10-CM

## 2019-08-03 HISTORY — PX: LEFT HEART CATH AND CORONARY ANGIOGRAPHY: CATH118249

## 2019-08-03 SURGERY — LEFT HEART CATH AND CORONARY ANGIOGRAPHY
Anesthesia: LOCAL

## 2019-08-03 MED ORDER — FENTANYL CITRATE (PF) 100 MCG/2ML IJ SOLN
INTRAMUSCULAR | Status: DC | PRN
  Administered 2019-08-03: 25 ug via INTRAVENOUS

## 2019-08-03 MED ORDER — SODIUM CHLORIDE 0.9 % WEIGHT BASED INFUSION: 1.0000 mL/kg/h | INTRAVENOUS | Status: DC

## 2019-08-03 MED ORDER — SODIUM CHLORIDE 0.9 % IV SOLN: 250.0000 mL | INTRAVENOUS | Status: DC | PRN

## 2019-08-03 MED ORDER — HYDRALAZINE HCL 20 MG/ML IJ SOLN: 10.0000 mg | INTRAMUSCULAR | Status: DC | PRN

## 2019-08-03 MED ORDER — LIDOCAINE HCL (PF) 1 % IJ SOLN
INTRAMUSCULAR | Status: AC
  Filled 2019-08-03: qty 30

## 2019-08-03 MED ORDER — HEPARIN SODIUM (PORCINE) 1000 UNIT/ML IJ SOLN
INTRAMUSCULAR | Status: AC
  Filled 2019-08-03: qty 1

## 2019-08-03 MED ORDER — HEPARIN (PORCINE) IN NACL 1000-0.9 UT/500ML-% IV SOLN
INTRAVENOUS | Status: AC
  Filled 2019-08-03: qty 1000

## 2019-08-03 MED ORDER — LABETALOL HCL 5 MG/ML IV SOLN: 10.0000 mg | INTRAVENOUS | Status: DC | PRN

## 2019-08-03 MED ORDER — MIDAZOLAM HCL 2 MG/2ML IJ SOLN
INTRAMUSCULAR | Status: AC
  Filled 2019-08-03: qty 2

## 2019-08-03 MED ORDER — MORPHINE SULFATE (PF) 2 MG/ML IV SOLN: 2.0000 mg | INTRAVENOUS | Status: DC | PRN

## 2019-08-03 MED ORDER — HEPARIN (PORCINE) IN NACL 1000-0.9 UT/500ML-% IV SOLN
INTRAVENOUS | Status: DC | PRN
  Administered 2019-08-03: 500 mL

## 2019-08-03 MED ORDER — SODIUM CHLORIDE 0.9% FLUSH: 3.0000 mL | INTRAVENOUS | Status: DC | PRN

## 2019-08-03 MED ORDER — VERAPAMIL HCL 2.5 MG/ML IV SOLN
INTRA_ARTERIAL | Status: DC | PRN
  Administered 2019-08-03: 15 mL via INTRA_ARTERIAL

## 2019-08-03 MED ORDER — MIDAZOLAM HCL 2 MG/2ML IJ SOLN
INTRAMUSCULAR | Status: DC | PRN
  Administered 2019-08-03: 1 mg via INTRAVENOUS

## 2019-08-03 MED ORDER — SODIUM CHLORIDE 0.9% FLUSH: 3.0000 mL | Freq: Two times a day (BID) | INTRAVENOUS | Status: DC

## 2019-08-03 MED ORDER — VERAPAMIL HCL 2.5 MG/ML IV SOLN
INTRAVENOUS | Status: AC
  Filled 2019-08-03: qty 2

## 2019-08-03 MED ORDER — ONDANSETRON HCL 4 MG/2ML IJ SOLN: 4.0000 mg | Freq: Four times a day (QID) | INTRAMUSCULAR | Status: DC | PRN

## 2019-08-03 MED ORDER — SODIUM CHLORIDE 0.9 % WEIGHT BASED INFUSION
3.0000 mL/kg/h | INTRAVENOUS | Status: AC
  Administered 2019-08-03: 3 mL/kg/h via INTRAVENOUS

## 2019-08-03 MED ORDER — FENTANYL CITRATE (PF) 100 MCG/2ML IJ SOLN
INTRAMUSCULAR | Status: AC
  Filled 2019-08-03: qty 2

## 2019-08-03 MED ORDER — ACETAMINOPHEN 325 MG PO TABS: 650.0000 mg | ORAL_TABLET | ORAL | Status: DC | PRN

## 2019-08-03 MED ORDER — SODIUM CHLORIDE 0.9 % IV SOLN: INTRAVENOUS | Status: AC

## 2019-08-03 MED ORDER — ASPIRIN 81 MG PO CHEW: 81.0000 mg | CHEWABLE_TABLET | ORAL | Status: DC

## 2019-08-03 MED ORDER — NITROGLYCERIN 1 MG/10 ML FOR IR/CATH LAB
INTRA_ARTERIAL | Status: AC
  Filled 2019-08-03: qty 10

## 2019-08-03 MED ORDER — HEPARIN SODIUM (PORCINE) 1000 UNIT/ML IJ SOLN
INTRAMUSCULAR | Status: DC | PRN
  Administered 2019-08-03: 4500 [IU] via INTRAVENOUS

## 2019-08-03 MED ORDER — IOHEXOL 350 MG/ML SOLN
INTRAVENOUS | Status: DC | PRN
  Administered 2019-08-03: 55 mL

## 2019-08-03 MED ORDER — LIDOCAINE HCL (PF) 1 % IJ SOLN
INTRAMUSCULAR | Status: DC | PRN
  Administered 2019-08-03: 3 mL

## 2019-08-03 SURGICAL SUPPLY — 11 items
CATH INFINITI JR4 5F (CATHETERS) ×2 IMPLANT
CATH OPTITORQUE TIG 4.0 5F (CATHETERS) ×2 IMPLANT
DEVICE RAD COMP TR BAND LRG (VASCULAR PRODUCTS) ×4 IMPLANT
GLIDESHEATH SLEND A-KIT 6F 22G (SHEATH) ×2 IMPLANT
GUIDEWIRE INQWIRE 1.5J.035X260 (WIRE) ×1 IMPLANT
INQWIRE 1.5J .035X260CM (WIRE) ×2
KIT HEART LEFT (KITS) ×2 IMPLANT
PACK CARDIAC CATHETERIZATION (CUSTOM PROCEDURE TRAY) ×2 IMPLANT
TRANSDUCER W/STOPCOCK (MISCELLANEOUS) ×2 IMPLANT
TUBING CIL FLEX 10 FLL-RA (TUBING) ×2 IMPLANT
WIRE HI TORQ VERSACORE-J 145CM (WIRE) ×2 IMPLANT

## 2019-08-03 NOTE — Progress Notes (Signed)
Note reviewed.   Nance Pear NP

## 2019-08-03 NOTE — Discharge Instructions (Signed)
Radial Site Care ° °This sheet gives you information about how to care for yourself after your procedure. Your health care provider may also give you more specific instructions. If you have problems or questions, contact your health care provider. °What can I expect after the procedure? °After the procedure, it is common to have: °· Bruising and tenderness at the catheter insertion area. °Follow these instructions at home: °Medicines °· Take over-the-counter and prescription medicines only as told by your health care provider. °Insertion site care °· Follow instructions from your health care provider about how to take care of your insertion site. Make sure you: °? Wash your hands with soap and water before you change your bandage (dressing). If soap and water are not available, use hand sanitizer. °? Change your dressing as told by your health care provider. °? Leave stitches (sutures), skin glue, or adhesive strips in place. These skin closures may need to stay in place for 2 weeks or longer. If adhesive strip edges start to loosen and curl up, you may trim the loose edges. Do not remove adhesive strips completely unless your health care provider tells you to do that. °· Check your insertion site every day for signs of infection. Check for: °? Redness, swelling, or pain. °? Fluid or blood. °? Pus or a bad smell. °? Warmth. °· Do not take baths, swim, or use a hot tub until your health care provider approves. °· You may shower 24-48 hours after the procedure, or as directed by your health care provider. °? Remove the dressing and gently wash the site with plain soap and water. °? Pat the area dry with a clean towel. °? Do not rub the site. That could cause bleeding. °· Do not apply powder or lotion to the site. °Activity ° °· For 24 hours after the procedure, or as directed by your health care provider: °? Do not flex or bend the affected arm. °? Do not push or pull heavy objects with the affected arm. °? Do not  drive yourself home from the hospital or clinic. You may drive 24 hours after the procedure unless your health care provider tells you not to. °? Do not operate machinery or power tools. °· Do not lift anything that is heavier than 10 lb (4.5 kg), or the limit that you are told, until your health care provider says that it is safe. °· Ask your health care provider when it is okay to: °? Return to work or school. °? Resume usual physical activities or sports. °? Resume sexual activity. °General instructions °· If the catheter site starts to bleed, raise your arm and put firm pressure on the site. If the bleeding does not stop, get help right away. This is a medical emergency. °· If you went home on the same day as your procedure, a responsible adult should be with you for the first 24 hours after you arrive home. °· Keep all follow-up visits as told by your health care provider. This is important. °Contact a health care provider if: °· You have a fever. °· You have redness, swelling, or yellow drainage around your insertion site. °Get help right away if: °· You have unusual pain at the radial site. °· The catheter insertion area swells very fast. °· The insertion area is bleeding, and the bleeding does not stop when you hold steady pressure on the area. °· Your arm or hand becomes pale, cool, tingly, or numb. °These symptoms may represent a serious problem   that is an emergency. Do not wait to see if the symptoms will go away. Get medical help right away. Call your local emergency services (911 in the [place] Do not drive yourself to the hospital. °Summary °· After the procedure, it is common to have bruising and tenderness at the site. °· Follow instructions from your health care provider about how to take care of your radial site wound. Check the wound every day for signs of infection. °· Do not lift anything that is heavier than 10 lb (4.5 kg), or the limit that you are told, until your health care provider says  that it is safe. °This information is not intended to replace advice given to you by your health care provider. Make sure you discuss any questions you have with your health care provider. °Document Released: 09/15/2010 Document Revised: 09/18/2017 Document Reviewed: 09/18/2017 °Elsevier Patient Education © 2020 [employer] ° °

## 2019-08-06 ENCOUNTER — Ambulatory Visit (INDEPENDENT_AMBULATORY_CARE_PROVIDER_SITE_OTHER): Payer: Medicare Other

## 2019-08-06 ENCOUNTER — Other Ambulatory Visit: Payer: Self-pay

## 2019-08-06 DIAGNOSIS — E538 Deficiency of other specified B group vitamins: Secondary | ICD-10-CM

## 2019-08-06 MED ORDER — CYANOCOBALAMIN 1000 MCG/ML IJ SOLN
1000.0000 ug | Freq: Once | INTRAMUSCULAR | Status: AC
  Administered 2019-08-06: 1000 ug via INTRAMUSCULAR

## 2019-08-06 NOTE — Progress Notes (Addendum)
Pt here for weekly B12 injection per PCP order on 07/14/2019.   B12 1062mcg given IM, and pt tolerated injection well.  Next B12 injection scheduled for one month.    RN note reviewed:  Nance Pear NP

## 2019-09-02 ENCOUNTER — Telehealth: Payer: Self-pay | Admitting: *Deleted

## 2019-09-02 ENCOUNTER — Other Ambulatory Visit: Payer: Self-pay | Admitting: *Deleted

## 2019-09-02 DIAGNOSIS — I2699 Other pulmonary embolism without acute cor pulmonale: Secondary | ICD-10-CM

## 2019-09-02 DIAGNOSIS — I82431 Acute embolism and thrombosis of right popliteal vein: Secondary | ICD-10-CM

## 2019-09-02 MED ORDER — RIVAROXABAN 20 MG PO TABS: 20.0000 mg | ORAL_TABLET | Freq: Every day | ORAL | 11 refills | Status: DC

## 2019-09-02 NOTE — Telephone Encounter (Signed)
Called patient and told her that she needs to call Wynetta Emery and Wynetta Emery to arrange shipment of Xarelto. Left office number if she has any questions or concerns.

## 2019-09-04 ENCOUNTER — Telehealth (INDEPENDENT_AMBULATORY_CARE_PROVIDER_SITE_OTHER): Payer: Medicare HMO | Admitting: Cardiology

## 2019-09-04 ENCOUNTER — Other Ambulatory Visit: Payer: Self-pay

## 2019-09-04 ENCOUNTER — Encounter: Payer: Self-pay | Admitting: Cardiology

## 2019-09-04 VITALS — Ht 62.0 in

## 2019-09-04 DIAGNOSIS — I2699 Other pulmonary embolism without acute cor pulmonale: Secondary | ICD-10-CM | POA: Diagnosis not present

## 2019-09-04 DIAGNOSIS — I519 Heart disease, unspecified: Secondary | ICD-10-CM | POA: Diagnosis not present

## 2019-09-04 DIAGNOSIS — I10 Essential (primary) hypertension: Secondary | ICD-10-CM

## 2019-09-04 NOTE — Progress Notes (Signed)
telephone Visit.  Date:  09/04/2019   ID:  Ruth Jensen, DOB 03-21-1951, MRN [id number]   The Patient is home. The provider is home.   PCP:  [name], NP  Cardiologist:  Berniece Salines, DO  Electrophysiologist:  None   Evaluation Performed:  Follow up visit  Chief Complaint:    History of Present Illness:    Ruth Jensen is a 69 y.o. 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  During her visit after review of her medical record her echo did show evidence of inferior hypokinesis and with a depressed ejection fraction patient recommended for cardiac catheterization.  She underwent cardiac catheterization on August 03, 2019 which showed normal coronaries.  She is here for follow-up visit.She tells me that she is doing well from a CV standpoint. She denies chest pain, shortness of breath, lightheadedness, dizziness. She reports that she has been compliant with the medication. Unfortunately she does not have a list of her blood pressure and she is unable to get this done today she tells me her daughter takes her blood pressure manually and she is hoping to get this done today which comes on.  This visit is virtual due to inclement weather.  The patient does not have symptoms concerning for COVID-19 infection.   Past Medical History:  Diagnosis Date  . DVT (deep venous thrombosis) ([place]) 07/01/2019  . Pulmonary embolism ([place]) 07/02/2019   Past Surgical History:  Procedure Laterality Date  . ABDOMINAL HYSTERECTOMY  2010   due to fibroids  . LEFT HEART CATH AND CORONARY ANGIOGRAPHY N/A 08/03/2019   Procedure: LEFT HEART CATH AND CORONARY ANGIOGRAPHY;  Surgeon: Lorretta Harp, MD;  Location: [facility];  Service: Cardiovascular;  Laterality: N/A;     Current Meds  Medication Sig  . atorvastatin (LIPITOR) 20 MG tablet Take 1 tablet (20 mg  total) by mouth daily.  . Cyanocobalamin (B-12 COMPLIANCE INJECTION) 1000 MCG/ML KIT Inject 1,000 mcg as directed every 30 (thirty) days.   Marland Kitchen lisinopril (ZESTRIL) 2.5 MG tablet Take 1 tablet (2.5 mg total) by mouth daily.  . metoprolol succinate (TOPROL XL) 25 MG 24 hr tablet Take 0.5 tablets (12.5 mg total) by mouth daily.  . Multiple Vitamin (MULTIVITAMIN WITH MINERALS) TABS tablet Take 1 tablet by mouth daily.  . nitroGLYCERIN (NITROSTAT) 0.4 MG SL tablet Place 1 tablet (0.4 mg total) under the tongue every 5 (five) minutes as needed.  . rivaroxaban (XARELTO) 20 MG TABS tablet Take 1 tablet (20 mg total) by mouth daily with supper.     Allergies:   Patient has no known allergies.   Social History   Tobacco Use  . Smoking status: Never Smoker  . Smokeless tobacco: Never Used  Substance Use Topics  . Alcohol use: Not Currently  . Drug use: Never     Family Hx: The patient's family history includes Asthma in her paternal grandfather; CAD in her sister; CVA in her mother; Dementia in her father; Hypertension in her daughter, father, mother, sister, sister, sister, and son; Parkinson's disease in her sister; Skin cancer in her sister.  ROS:   Constitution: Negative for decreased appetite, fever and weight gain.  HENT: Negative for congestion, ear discharge, hoarse voice and sore throat.   Eyes: Negative for discharge, redness, vision loss in right eye and visual halos.  Cardiovascular: Negative for chest pain, dyspnea on exertion,  leg swelling, orthopnea and palpitations.  Respiratory: Negative for cough, hemoptysis, shortness of breath and snoring.   Endocrine: Negative for heat intolerance and polyphagia.  Hematologic/Lymphatic: Negative for bleeding problem. Does not bruise/bleed easily.  Skin: Negative for flushing, nail changes, rash and suspicious lesions.  Musculoskeletal: Negative for arthritis, joint pain, muscle cramps, myalgias, neck pain and stiffness.  Gastrointestinal:  Negative for abdominal pain, bowel incontinence, diarrhea and excessive appetite.  Genitourinary: Negative for decreased libido, genital sores and incomplete emptying.  Neurological: Negative for brief paralysis, focal weakness, headaches and loss of balance.  Psychiatric/Behavioral: Negative for altered mental status, depression and suicidal ideas.  Allergic/Immunologic: Negative for HIV exposure and persistent infections.     Prior CV studies:   The following studies were reviewed today  Left heart Cath:08/03/2019 History obtained from chart review.  Ms. Dicenso is a 69 year old mildly overweight married African-American female patient of Dr. Harriet Masson who was recently seen for chest pain.  She does have a history of DVT/pulmonary embolism on Xarelto.  A 2D echo revealed moderate LV dysfunction with a inferior wall motion abnormality.  Because of this and her chest pain she was referred for diagnostic coronary angiography to define her anatomy  IMPRESSION:Ms Stonesifer has normal coronary arteries and normal filling pressures.  Her LV dysfunction represents a nonischemic cardiomyopathy and her chest pain is noncardiac.  The sheath was removed and a TR band was placed on the right wrist to achieve patent hemostasis.  The patient left the lab in stable condition.  Dr. Harriet Masson was notified of these results.  The patient can restart her Xarelto tomorrow for her recent DVT/PE.  She can be discharged home today as an outpatient  TTE IMPRESSIONS 07/02/2019  1. Left ventricular ejection fraction, by visual estimation, is 40 to 45%. The left ventricle has mildly decreased function. There is mildly increased left ventricular hypertrophy. Diffuse hypokinesis, inferior wall looks regionally worse.  2. Left ventricular diastolic parameters are consistent with Grade I diastolic dysfunction (impaired relaxation).  3. Global right ventricle has mildly reduced systolic function.The right ventricular size is normal. No  increase in right ventricular wall thickness.  4. Left atrial size was mildly dilated.  5. Right atrial size was mildly dilated.  6. The mitral valve is normal in structure. Trace mitral valve regurgitation. No evidence of mitral stenosis.  7. The tricuspid valve is normal in structure. Tricuspid valve regurgitation is trivial.  8. The aortic valve is tricuspid. Aortic valve regurgitation is not visualized. No evidence of aortic valve sclerosis or stenosis.  9. The inferior vena cava is normal in size with greater than 50% respiratory variability, suggesting right atrial pressure of 3 mmHg. 10. The tricuspid regurgitant velocity is 2.33 m/s, and with an assumed right atrial pressure of 3 mmHg, the estimated right ventricular systolic pressure is normal at 24.7 mmHg.   CTA chest IMPRESSION:07/01/2019 CT is positive for acute pulmonary emboli of the left lower lobe segmental and subsegmental vasculature. Coronary artery disease.   Labs/Other Tests and Data Reviewed:    EKG:  None today  Recent Labs: 07/01/2019: B Natriuretic Peptide 74.6 07/03/2019: Magnesium 2.2 07/21/2019: ALT 7 07/22/2019: BUN 11; Creatinine, Ser 0.74; Hemoglobin 10.7; Platelets 340; Potassium 3.5; Sodium 143   Recent Lipid Panel Lab Results  Component Value Date/Time   CHOL 234 (H) 07/22/2019 02:18 PM   TRIG 87 07/22/2019 02:18 PM   HDL 51 07/22/2019 02:18 PM   CHOLHDL 4.6 (H) 07/22/2019 02:18 PM   LDLCALC 168 (H) 07/22/2019  02:18 PM    Wt Readings from Last 3 Encounters:  08/03/19 197 lb (89.4 kg)  07/22/19 195 lb (88.5 kg)  07/21/19 194 lb 1.9 oz (88.1 kg)     Objective:    Vital Signs:  Ht 5' 2" (1.575 m)   BMI 36.03 kg/m    Unable to perform physical exam due to virtual visit.  ASSESSMENT & PLAN:    Depressed left ventricle ejection fraction-I will continue the patient on her metoprolol succinate 12.5 mg daily as well as her lisinopril 2.5 mg daily. Her recent left heart catheterization showed  no evidence of coronary disease. Her depressed ejection fraction could have been in the setting of her hypertensive heart disease. We will continue her current medications. Repeat her echocardiogram in 3 months. At which time further recommendations will be made if need for further diagnostic work-up.   2. Hypertension-unable to obtain her blood pressure today. Patient states that she will get this done when her daughter gets home and she will call our office with this. Therefore no medication changes.   3. Hyperlipidemia-continue patient on her atorvastatin.   4. Obesity-the patient understands the need to lose weight with diet and exercise. We have discussed specific strategies for this.  COVID-19 Education: The signs and symptoms of COVID-19 were discussed with the patient and how to seek care for testing (follow up with PCP or arrange E-visit).  The importance of social distancing was discussed today.  Time:   Today, I have spent 5 minutes with the patient with telehealth technology discussing the above problems.     Medication Adjustments/Labs and Tests Ordered: Current medicines are reviewed at length with the patient today.  Concerns regarding medicines are outlined above.   Tests Ordered: No orders of the defined types were placed in this encounter.   Medication Changes: No orders of the defined types were placed in this encounter.   Follow Up:  6 months  Signed, Berniece Salines, DO  09/04/2019 10:29 AM    [facility]

## 2019-09-04 NOTE — Patient Instructions (Signed)
Medication Instructions:  Your physician recommends that you continue on your current medications as directed. Please refer to the Current Medication list given to you today.  *If you need a refill on your cardiac medications before your next appointment, please call your pharmacy*  Lab Work: None If you have labs (blood work) drawn today and your tests are completely normal, you will receive your results only by: . MyChart Message (if you have MyChart) OR . A paper copy in the mail If you have any lab test that is abnormal or we need to change your treatment, we will call you to review the results.  Testing/Procedures: None  Follow-Up: At [facility], you and your health needs are our priority.  As part of our continuing mission to provide you with exceptional heart care, we have created designated Provider Care Teams.  These Care Teams include your primary Cardiologist (physician) and Advanced Practice Providers (APPs -  Physician Assistants and Nurse Practitioners) who all work together to provide you with the care you need, when you need it.  Your next appointment:   6 month(s)  The format for your next appointment:   In Person  Provider:   Kardie Tobb, DO  Other Instructions   

## 2019-09-08 ENCOUNTER — Other Ambulatory Visit: Payer: Self-pay

## 2019-09-08 ENCOUNTER — Ambulatory Visit (INDEPENDENT_AMBULATORY_CARE_PROVIDER_SITE_OTHER): Payer: Medicare HMO

## 2019-09-08 DIAGNOSIS — E538 Deficiency of other specified B group vitamins: Secondary | ICD-10-CM | POA: Diagnosis not present

## 2019-09-08 MED ORDER — CYANOCOBALAMIN 1000 MCG/ML IJ SOLN
1000.0000 ug | Freq: Once | INTRAMUSCULAR | Status: AC
  Administered 2019-09-08: 1000 ug via INTRAMUSCULAR

## 2019-09-08 NOTE — Progress Notes (Signed)
Pt here for monthly B12 injection per PCP order.   B12 1052mcg given IM, and pt tolerated injection well.  Next B12 injection scheduled for 10/13/2019.

## 2019-09-21 ENCOUNTER — Encounter: Payer: Self-pay | Admitting: Hematology

## 2019-09-25 ENCOUNTER — Telehealth: Payer: Self-pay | Admitting: Cardiology

## 2019-09-25 MED ORDER — METOPROLOL SUCCINATE ER 25 MG PO TB24: 12.5000 mg | ORAL_TABLET | Freq: Every day | ORAL | 1 refills | Status: DC

## 2019-09-25 MED ORDER — LISINOPRIL 2.5 MG PO TABS: 2.5000 mg | ORAL_TABLET | Freq: Every day | ORAL | 1 refills | Status: DC

## 2019-09-25 NOTE — Telephone Encounter (Signed)
Medications have been refilled. There is a recall in for her to FU in 6 months.

## 2019-09-25 NOTE — Telephone Encounter (Signed)
Pt c/o medication issue:  1. Name of Medication:  metoprolol succinate (TOPROL XL) 25 MG 24 hr tablet lisinopril (ZESTRIL) 2.5 MG tablet  2. How are you currently taking this medication (dosage and times per day)? Metoprolol 1/2 tablet once a day, lisinopril 1 tablet once a day  3. Are you having a reaction (difficulty breathing--STAT)? no  4. What is your medication issue? Patient would like to know if she needs to continue the medications.

## 2019-09-25 NOTE — Addendum Note (Signed)
Addended by: Antonieta Iba on: 09/25/2019 01:46 PM   Modules accepted: Orders

## 2019-09-25 NOTE — Telephone Encounter (Signed)
Yes please refill her medication.  Also please verify that she has an upcoming follow-up visit.  Thank you very much I appreciate your support.

## 2019-09-25 NOTE — Telephone Encounter (Signed)
Patient is calling to see if she should still be taking Toprol and lisinopril. She states that she has run out and her bottle says no refills so she was unsure.   Per her last visit with Dr. Harriet Masson she should continue these medications.  Will forward to Dr. Harriet Masson and nurse to confirm it is okay to refill.

## 2019-10-13 ENCOUNTER — Other Ambulatory Visit: Payer: Self-pay

## 2019-10-13 ENCOUNTER — Ambulatory Visit (INDEPENDENT_AMBULATORY_CARE_PROVIDER_SITE_OTHER): Payer: Medicare HMO

## 2019-10-13 DIAGNOSIS — E538 Deficiency of other specified B group vitamins: Secondary | ICD-10-CM

## 2019-10-13 MED ORDER — CYANOCOBALAMIN 1000 MCG/ML IJ SOLN
1000.0000 ug | Freq: Once | INTRAMUSCULAR | Status: AC
  Administered 2019-10-13: 1000 ug via INTRAMUSCULAR

## 2019-10-13 NOTE — Progress Notes (Signed)
Pt here for monthly B12 injection per Lemar Livings  B12 1021mcg given IM,Lefr Deltoid , and pt tolerated injection well.  Next B12 injection scheduled for next month.

## 2019-10-14 NOTE — Progress Notes (Signed)
Virtual Visit via Audio Note  I connected with patient on 10/15/19 at  1:45 PM EST by audio enabled telemedicine application and verified that I am speaking with the correct person using two identifiers.   THIS ENCOUNTER IS A VIRTUAL VISIT DUE TO COVID-19 - PATIENT WAS NOT SEEN IN THE OFFICE. PATIENT HAS CONSENTED TO VIRTUAL VISIT / TELEMEDICINE VISIT   Location of patient: home  Location of provider: office  I discussed the limitations of evaluation and management by telemedicine and the availability of in person appointments. The patient expressed understanding and agreed to proceed.   Subjective:   Ruth Jensen is a 69 y.o. female who presents for an Initial Medicare Annual Wellness Visit.  Review of Systems     Home Safety/Smoke Alarms: Feels safe in home. Smoke alarms in place.  Lives w/ husband in 1 story home.   Female:     Mammo- ordered      Dexa scan- ordered       CCS- declines    Objective:    Today's Vitals   10/15/19 1354  Pulse: 73   There is no height or weight on file to calculate BMI.  Advanced Directives 10/15/2019 08/03/2019 07/21/2019 07/01/2019  Does Patient Have a Medical Advance Directive? No No No No  Would patient like information on creating a medical advance directive? No - Patient declined No - Patient declined No - Patient declined No - Patient declined    Current Medications (verified) Outpatient Encounter Medications as of 10/15/2019  Medication Sig  . Cyanocobalamin (B-12 COMPLIANCE INJECTION) 1000 MCG/ML KIT Inject 1,000 mcg as directed every 30 (thirty) days.   Marland Kitchen lisinopril (ZESTRIL) 2.5 MG tablet Take 1 tablet (2.5 mg total) by mouth daily.  . metoprolol succinate (TOPROL XL) 25 MG 24 hr tablet Take 0.5 tablets (12.5 mg total) by mouth daily.  . Multiple Vitamin (MULTIVITAMIN WITH MINERALS) TABS tablet Take 1 tablet by mouth daily.  . nitroGLYCERIN (NITROSTAT) 0.4 MG SL tablet Place 1 tablet (0.4 mg total) under the tongue every 5  (five) minutes as needed.  . rivaroxaban (XARELTO) 20 MG TABS tablet Take 1 tablet (20 mg total) by mouth daily with supper.  Marland Kitchen atorvastatin (LIPITOR) 20 MG tablet Take 1 tablet (20 mg total) by mouth daily. (Patient not taking: Reported on 10/15/2019)   No facility-administered encounter medications on file as of 10/15/2019.    Allergies (verified) Patient has no known allergies.   History: Past Medical History:  Diagnosis Date  . DVT (deep venous thrombosis) ([place]) 07/01/2019  . Pulmonary embolism ([place]) 07/02/2019   Past Surgical History:  Procedure Laterality Date  . ABDOMINAL HYSTERECTOMY  2010   due to fibroids  . LEFT HEART CATH AND CORONARY ANGIOGRAPHY N/A 08/03/2019   Procedure: LEFT HEART CATH AND CORONARY ANGIOGRAPHY;  Surgeon: Lorretta Harp, MD;  Location: [facility];  Service: Cardiovascular;  Laterality: N/A;   Family History  Problem Relation Age of Onset  . Hypertension Mother   . CVA Mother        hemiparesis  . Hypertension Father   . Dementia Father   . Hypertension Sister   . CAD Sister        scheduled for pacemaker  . Asthma Paternal Grandfather   . Hypertension Sister   . Skin cancer Sister        melanoma died at age 110  . Hypertension Sister        ? PFO repair  .  Parkinson's disease Sister   . Hypertension Daughter   . Hypertension Son        pacemaker   Social History   Socioeconomic History  . Marital status: Married    Spouse name: Truman Hayward  . Number of children: 2  . Years of education: Not on file  . Highest education level: Not on file  Occupational History  . Occupation: retired   Tobacco Use  . Smoking status: Never Smoker  . Smokeless tobacco: Never Used  Substance and Sexual Activity  . Alcohol use: Not Currently  . Drug use: Never  . Sexual activity: Not Currently  Other Topics Concern  . Not on file  Social History Narrative  . Not on file   Social Determinants of Health   Financial Resource Strain: Low Risk     . Difficulty of Paying Living Expenses: Not hard at all  Food Insecurity: No Food Insecurity  . Worried About Charity fundraiser in the Last Year: Never true  . Ran Out of Food in the Last Year: Never true  Transportation Needs: No Transportation Needs  . Lack of Transportation (Medical): No  . Lack of Transportation (Non-Medical): No  Physical Activity: Unknown  . Days of Exercise per Week: 0 days  . Minutes of Exercise per Session: Not on file  Stress:   . Feeling of Stress : Not on file  Social Connections: Somewhat Isolated  . Frequency of Communication with Friends and Family: More than three times a week  . Frequency of Social Gatherings with Friends and Family: Never  . Attends Religious Services: Never  . Active Member of Clubs or Organizations: No  . Attends Archivist Meetings: Never  . Marital Status: Married    Tobacco Counseling Counseling given: Not Answered   Clinical Intake: Pain : No/denies pain     Activities of Daily Living In your present state of health, do you have any difficulty performing the following activities: 10/15/2019 07/01/2019  Hearing? N N  Vision? N N  Difficulty concentrating or making decisions? N N  Walking or climbing stairs? N N  Dressing or bathing? N N  Doing errands, shopping? N Y  Conservation officer, nature and eating ? N -  Using the Toilet? N -  In the past six months, have you accidently leaked urine? N -  Do you have problems with loss of bowel control? N -  Managing your Medications? N -  Managing your Finances? N -  Housekeeping or managing your Housekeeping? N -  Some recent data might be hidden     Immunizations and Health Maintenance Immunization History  Administered Date(s) Administered  . Fluad Quad(high Dose 65+) 07/14/2019   Health Maintenance Due  Topic Date Due  . Hepatitis C Screening  11/26/1950  . TETANUS/TDAP  12/09/1968  . MAMMOGRAM  12/10/1999  . COLONOSCOPY  12/10/1999  . DEXA SCAN   12/10/2014  . PNA vac Low Risk Adult (1 of 2 - PCV13) 12/10/2014    Patient Care Team: [name], NP as PCP - General (Internal Medicine) Berniece Salines, DO as PCP - Cardiology (Cardiology)  Indicate any recent Medical Services you may have received from other than [facility] providers in the past year (date may be approximate).     Assessment:   This is a routine wellness examination for Ruth Jensen. Physical assessment deferred to PCP.  Hearing/Vision screen Unable to assess. This visit is enabled though telemedicine due to Covid 19.   Dietary issues and  exercise activities discussed: Current Exercise Habits: The patient does not participate in regular exercise at present, Exercise limited by: None identified Diet (meal preparation, eat out, water intake, caffeinated beverages, dairy products, fruits and vegetables): in general, a "healthy" diet  , well balanced   Goals    . Increase physical activity      Depression Screen PHQ 2/9 Scores 10/15/2019  PHQ - 2 Score 0    Fall Risk Fall Risk  10/15/2019  Falls in the past year? 0  Number falls in past yr: 0  Injury with Fall? 0  Follow up Education provided;Falls prevention discussed     Cognitive Function: Ad8 score reviewed for issues:  Issues making decisions:no  Less interest in hobbies / activities:no  Repeats questions, stories (family complaining):no  Trouble using ordinary gadgets (microwave, computer, phone):no  Forgets the month or year: no  Mismanaging finances: no  Remembering appts:no  Daily problems with thinking and/or memory:no Ad8 score is=0         Screening Tests Health Maintenance  Topic Date Due  . Hepatitis C Screening  [birth year]  . TETANUS/TDAP  12/09/1968  . MAMMOGRAM  12/10/1999  . COLONOSCOPY  12/10/1999  . DEXA SCAN  12/10/2014  . PNA vac Low Risk Adult (1 of 2 - PCV13) 12/10/2014  . INFLUENZA VACCINE  Completed      Plan:    Please schedule your next medicare wellness  visit with me in 1 yr.  Continue to eat heart healthy diet (full of fruits, vegetables, whole grains, lean protein, water--limit salt, fat, and sugar intake) and increase physical activity as tolerated.  Continue doing brain stimulating activities (puzzles, reading, adult coloring books, staying active) to keep memory sharp.    I have ordered your mammogram and bone density scan as discussed.  I have personally reviewed and noted the following in the patient's chart:   . Medical and social history . Use of alcohol, tobacco or illicit drugs  . Current medications and supplements . Functional ability and status . Nutritional status . Physical activity . Advanced directives . List of other physicians . Hospitalizations, surgeries, and ER visits in previous 12 months . Vitals . Screenings to include cognitive, depression, and falls . Referrals and appointments  In addition, I have reviewed and discussed with patient certain preventive protocols, quality metrics, and best practice recommendations. A written personalized care plan for preventive services as well as general preventive health recommendations were provided to patient.     Naaman Plummer [place], [place]   10/15/2019

## 2019-10-14 NOTE — Progress Notes (Signed)
Note reviewed.   Nance Pear NP

## 2019-10-15 ENCOUNTER — Encounter: Payer: Self-pay | Admitting: *Deleted

## 2019-10-15 ENCOUNTER — Other Ambulatory Visit: Payer: Self-pay

## 2019-10-15 ENCOUNTER — Ambulatory Visit (INDEPENDENT_AMBULATORY_CARE_PROVIDER_SITE_OTHER): Payer: Medicare HMO | Admitting: *Deleted

## 2019-10-15 VITALS — HR 73

## 2019-10-15 DIAGNOSIS — Z1231 Encounter for screening mammogram for malignant neoplasm of breast: Secondary | ICD-10-CM

## 2019-10-15 DIAGNOSIS — Z780 Asymptomatic menopausal state: Secondary | ICD-10-CM

## 2019-10-15 DIAGNOSIS — Z0000 Encounter for general adult medical examination without abnormal findings: Secondary | ICD-10-CM | POA: Diagnosis not present

## 2019-10-15 NOTE — Patient Instructions (Signed)
Please schedule your next medicare wellness visit with me in 1 yr.  Continue to eat heart healthy diet (full of fruits, vegetables, whole grains, lean protein, water--limit salt, fat, and sugar intake) and increase physical activity as tolerated.  Continue doing brain stimulating activities (puzzles, reading, adult coloring books, staying active) to keep memory sharp.    I have ordered your mammogram and bone density scan as discussed.   Ruth Jensen , Thank you for taking time to come for your Medicare Wellness Visit. I appreciate your ongoing commitment to your health goals. Please review the following plan we discussed and let me know if I can assist you in the future.   These are the goals we discussed: Goals    . Increase physical activity       This is a list of the screening recommended for you and due dates:  Health Maintenance  Topic Date Due  .  Hepatitis C: One time screening is recommended by Center for Disease Control  (CDC) for  adults born from [birth year] through 1966.   1950-09-26  . Tetanus Vaccine  12/09/1968  . Mammogram  12/10/1999  . Colon Cancer Screening  12/10/1999  . DEXA scan (bone density measurement)  12/10/2014  . Pneumonia vaccines (1 of 2 - PCV13) 12/10/2014  . Flu Shot  Completed    Preventive Care 65 Years and Older, Female Preventive care refers to lifestyle choices and visits with your health care provider that can promote health and wellness. This includes:  A yearly physical exam. This is also called an annual well check.  Regular dental and eye exams.  Immunizations.  Screening for certain conditions.  Healthy lifestyle choices, such as diet and exercise. What can I expect for my preventive care visit? Physical exam Your health care provider will check:  Height and weight. These may be used to calculate body mass index (BMI), which is a measurement that tells if you are at a healthy weight.  Heart rate and blood pressure.  Your skin for  abnormal spots. Counseling Your health care provider may ask you questions about:  Alcohol, tobacco, and drug use.  Emotional well-being.  Home and relationship well-being.  Sexual activity.  Eating habits.  History of falls.  Memory and ability to understand (cognition).  Work and work Statistician.  Pregnancy and menstrual history. What immunizations do I need?  Influenza (flu) vaccine  This is recommended every year. Tetanus, diphtheria, and pertussis (Tdap) vaccine  You may need a Td booster every 10 years. Varicella (chickenpox) vaccine  You may need this vaccine if you have not already been vaccinated. Zoster (shingles) vaccine  You may need this after age 107. Pneumococcal conjugate (PCV13) vaccine  One dose is recommended after age 27. Pneumococcal polysaccharide (PPSV23) vaccine  One dose is recommended after age 82. Measles, mumps, and rubella (MMR) vaccine  You may need at least one dose of MMR if you were born in 1957 or later. You may also need a second dose. Meningococcal conjugate (MenACWY) vaccine  You may need this if you have certain conditions. Hepatitis A vaccine  You may need this if you have certain conditions or if you travel or work in places where you may be exposed to hepatitis A. Hepatitis B vaccine  You may need this if you have certain conditions or if you travel or work in places where you may be exposed to hepatitis B. Haemophilus influenzae type b (Hib) vaccine  You may need this if you  have certain conditions. You may receive vaccines as individual doses or as more than one vaccine together in one shot (combination vaccines). Talk with your health care provider about the risks and benefits of combination vaccines. What tests do I need? Blood tests  Lipid and cholesterol levels. These may be checked every 5 years, or more frequently depending on your overall health.  Hepatitis C test.  Hepatitis B test. Screening  Lung  cancer screening. You may have this screening every year starting at age 44 if you have a 30-pack-year history of smoking and currently smoke or have quit within the past 15 years.  Colorectal cancer screening. All adults should have this screening starting at age 24 and continuing until age 44. Your health care provider may recommend screening at age 50 if you are at increased risk. You will have tests every 1-10 years, depending on your results and the type of screening test.  Diabetes screening. This is done by checking your blood sugar (glucose) after you have not eaten for a while (fasting). You may have this done every 1-3 years.  Mammogram. This may be done every 1-2 years. Talk with your health care provider about how often you should have regular mammograms.  BRCA-related cancer screening. This may be done if you have a family history of breast, ovarian, tubal, or peritoneal cancers. Other tests  Sexually transmitted disease (STD) testing.  Bone density scan. This is done to screen for osteoporosis. You may have this done starting at age 27. Follow these instructions at home: Eating and drinking  Eat a diet that includes fresh fruits and vegetables, whole grains, lean protein, and low-fat dairy products. Limit your intake of foods with high amounts of sugar, saturated fats, and salt.  Take vitamin and mineral supplements as recommended by your health care provider.  Do not drink alcohol if your health care provider tells you not to drink.  If you drink alcohol: ? Limit how much you have to 0-1 drink a day. ? Be aware of how much alcohol is in your drink. In the [place] one drink equals one 12 oz bottle of beer (355 mL), one 5 oz glass of wine (148 mL), or one 1 oz glass of hard liquor (44 mL). Lifestyle  Take daily care of your teeth and gums.  Stay active. Exercise for at least 30 minutes on 5 or more days each week.  Do not use any products that contain nicotine or tobacco,  such as cigarettes, e-cigarettes, and chewing tobacco. If you need help quitting, ask your health care provider.  If you are sexually active, practice safe sex. Use a condom or other form of protection in order to prevent STIs (sexually transmitted infections).  Talk with your health care provider about taking a low-dose aspirin or statin. What's next?  Go to your health care provider once a year for a well check visit.  Ask your health care provider how often you should have your eyes and teeth checked.  Stay up to date on all vaccines. This information is not intended to replace advice given to you by your health care provider. Make sure you discuss any questions you have with your health care provider. Document Revised: 08/07/2018 Document Reviewed: 08/07/2018 Elsevier Patient Education  2020 [employer].

## 2019-10-22 ENCOUNTER — Other Ambulatory Visit: Payer: Medicare Other

## 2019-10-22 ENCOUNTER — Ambulatory Visit: Payer: Medicare Other | Admitting: Hematology

## 2019-10-23 ENCOUNTER — Ambulatory Visit: Payer: Medicare Other | Admitting: Family

## 2019-10-23 ENCOUNTER — Other Ambulatory Visit: Payer: Medicare Other

## 2019-10-23 ENCOUNTER — Ambulatory Visit: Payer: Medicare Other | Admitting: Hematology

## 2019-10-26 ENCOUNTER — Other Ambulatory Visit (HOSPITAL_BASED_OUTPATIENT_CLINIC_OR_DEPARTMENT_OTHER): Payer: Medicare HMO

## 2019-10-26 ENCOUNTER — Ambulatory Visit (HOSPITAL_BASED_OUTPATIENT_CLINIC_OR_DEPARTMENT_OTHER): Payer: Medicare HMO

## 2019-10-27 ENCOUNTER — Other Ambulatory Visit: Payer: Medicare HMO

## 2019-10-27 ENCOUNTER — Ambulatory Visit: Payer: Self-pay | Admitting: Hematology

## 2019-11-12 ENCOUNTER — Other Ambulatory Visit: Payer: Self-pay

## 2019-11-13 ENCOUNTER — Other Ambulatory Visit: Payer: Self-pay

## 2019-11-13 ENCOUNTER — Ambulatory Visit (INDEPENDENT_AMBULATORY_CARE_PROVIDER_SITE_OTHER): Payer: Medicare HMO | Admitting: Family

## 2019-11-13 ENCOUNTER — Encounter: Payer: Self-pay | Admitting: Family

## 2019-11-13 VITALS — BP 147/79 | HR 80 | Temp 97.0°F | Resp 16 | Ht 62.0 in | Wt 199.0 lb

## 2019-11-13 DIAGNOSIS — E785 Hyperlipidemia, unspecified: Secondary | ICD-10-CM | POA: Diagnosis not present

## 2019-11-13 DIAGNOSIS — E538 Deficiency of other specified B group vitamins: Secondary | ICD-10-CM

## 2019-11-13 DIAGNOSIS — I10 Essential (primary) hypertension: Secondary | ICD-10-CM

## 2019-11-13 DIAGNOSIS — I82431 Acute embolism and thrombosis of right popliteal vein: Secondary | ICD-10-CM | POA: Diagnosis not present

## 2019-11-13 DIAGNOSIS — I2699 Other pulmonary embolism without acute cor pulmonale: Secondary | ICD-10-CM

## 2019-11-13 MED ORDER — ROSUVASTATIN CALCIUM 10 MG PO TABS: 10.0000 mg | ORAL_TABLET | Freq: Every day | ORAL | 3 refills | Status: DC

## 2019-11-13 MED ORDER — CYANOCOBALAMIN 1000 MCG/ML IJ SOLN
1000.0000 ug | Freq: Once | INTRAMUSCULAR | Status: AC
  Administered 2019-11-13: 1000 ug via INTRAMUSCULAR

## 2019-11-13 NOTE — Patient Instructions (Addendum)
Please call Dr. Lorette Ang office to reschedule your appointment.

## 2019-11-13 NOTE — Progress Notes (Signed)
Subjective:    Patient ID: Ruth Jensen, female    DOB: 1950/10/21, 69 y.o.   MRN: [id number]  HPI  Patient is a 69 yr old female who presents today for routine follow up.   HTN-  Maintained on toprol xl 12.19m/lisinopril 2.592m   BP Readings from Last 3 Encounters:  11/13/19 (!) 147/79  08/03/19 138/77  07/22/19 120/84   Hyperlipidemia- she discontinued lipitor. Reports that it made her "dizzy/swimmy headed.   Lab Results  Component Value Date   CHOL 234 (H) 07/22/2019   HDL 51 07/22/2019   LDLCALC 168 (H) 07/22/2019   TRIG 87 07/22/2019   CHOLHDL 4.6 (H) 07/22/2019   b12 deficiency- due fo b12 injection today.   CHF- saw cardiology 09/04/19. 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  Review of Systems See HPI  Past Medical History:  Diagnosis Date  . [place]  . [place]     Social History   Socioeconomic History  . Marital status: Married    Spouse name: LeTruman Hayward. Number of children: 2  . Years of education: Not on file  . Highest education level: Not on file  Occupational History  . Occupation: retired   Tobacco Use  . Smoking status: Never Smoker  . Smokeless tobacco: Never Used  Substance and Sexual Activity  . Alcohol use: Not Currently  . Drug use: Never  . Sexual activity: Not Currently  Other Topics Concern  . Not on file  Social History Narrative  . Not on file   Social Determinants of Health   Financial Resource Strain: Low Risk   . Difficulty of Paying Living Expenses: Not hard at all  Food Insecurity: No Food Insecurity  . Worried About RuCharity fundraisern the Last Year: Never true  . Ran Out of Food in the Last Year: Never true  Transportation Needs: No Transportation Needs  . Lack of Transportation (Medical): No    . Lack of Transportation (Non-Medical): No  Physical Activity: Unknown  . Days of Exercise per Week: 0 days  . Minutes of Exercise per Session: Not on file  Stress:   . Feeling of Stress :   Social Connections: Somewhat Isolated  . Frequency of Communication with Friends and Family: More than three times a week  . Frequency of Social Gatherings with Friends and Family: Never  . Attends Religious Services: Never  . Active Member of Clubs or Organizations: No  . Attends ClArchivisteetings: Never  . Marital Status: Married  InHuman resources officeriolence: Not At Risk  . Fear of Current or Ex-Partner: No  . Emotionally Abused: No  . Physically Abused: No  . Sexually Abused: No    Past Surgical History:  Procedure Laterality Date  . ABDOMINAL HYSTERECTOMY  2010   due to fibroids  . LEFT HEART CATH AND CORONARY ANGIOGRAPHY N/A 08/03/2019   Procedure: LEFT HEART CATH AND CORONARY ANGIOGRAPHY;  Surgeon: BeLorretta HarpMD;  [facility];  Service: Cardiovascular;  Laterality: N/A;    Family History  Problem Relation Age of Onset  . Hypertension Mother   . CVA Mother        hemiparesis  . Hypertension Father   . Dementia Father   . Hypertension  Sister   . CAD Sister        scheduled for pacemaker  . Asthma Paternal Grandfather   . Hypertension Sister   . Skin cancer Sister        melanoma died at age 67  . Hypertension Sister        ? PFO repair  . Parkinson's disease Sister   . Hypertension Daughter   . Hypertension Son        pacemaker    No Known Allergies  Current Outpatient Medications on File Prior to Visit  Medication Sig Dispense Refill  . Cyanocobalamin (B-12 COMPLIANCE INJECTION) 1000 MCG/ML KIT Inject 1,000 mcg as directed every 30 (thirty) days.     Marland Kitchen lisinopril (ZESTRIL) 2.5 MG tablet Take 1 tablet (2.5 mg total) by mouth daily. 90 tablet 1  . metoprolol succinate (TOPROL XL) 25 MG 24 hr tablet Take 0.5 tablets (12.5 mg total) by  mouth daily. 45 tablet 1  . Multiple Vitamin (MULTIVITAMIN WITH MINERALS) TABS tablet Take 1 tablet by mouth daily. 30 tablet 0  . rivaroxaban (XARELTO) 20 MG TABS tablet Take 1 tablet (20 mg total) by mouth daily with supper. 30 tablet 11  . nitroGLYCERIN (NITROSTAT) 0.4 MG SL tablet Place 1 tablet (0.4 mg total) under the tongue every 5 (five) minutes as needed. 30 tablet 3   No current facility-administered medications on file prior to visit.    BP (!) 147/79 (BP Location: Right Arm, Patient Position: Sitting, Cuff Size: Large)   Pulse 80   Temp (!) 97 F (36.1 C) (Temporal)   Resp 16   Ht 5' 2"  (1.575 m)   Wt 199 lb (90.3 kg)   SpO2 100%   BMI 36.40 kg/m       Objective:   Physical Exam Constitutional:      Appearance: She is well-developed.  Neck:     Thyroid: No thyromegaly.  Cardiovascular:     Rate and Rhythm: Normal rate and regular rhythm.     Heart sounds: Normal heart sounds. No murmur.  Pulmonary:     Effort: Pulmonary effort is normal. No respiratory distress.     Breath sounds: Normal breath sounds. No wheezing.  Musculoskeletal:     Cervical back: Neck supple.     Right lower leg: 1+ Edema present.     Left lower leg: 1+ Edema present.  Skin:    General: Skin is warm and dry.  Neurological:     Mental Status: She is alert and oriented to person, place, and time.  Psychiatric:        Behavior: Behavior normal.        Thought Content: Thought content normal.        Judgment: Judgment normal.           Assessment & Plan:  HTN- bp mildly elevated. Will continue current meds/doses for now.    PE- I advised the pt to schedule a follow up appointment with her hematologist. Will defer duration of xarelto to her hematologist.  Hyperlipidemia- did not tolerate statin. Will give trial of crestor to see if she can better tolerate.   b12 deficiency- continuing monthly b12 shots.  Plan to check b12 and lipids in 1 montn.    This visit occurred during  the SARS-CoV-2 public health emergency.  Safety protocols were in place, including screening questions prior to the visit, additional usage of staff PPE, and extensive cleaning of exam room while observing appropriate contact time as indicated  for disinfecting solutions.

## 2019-11-17 ENCOUNTER — Ambulatory Visit (HOSPITAL_BASED_OUTPATIENT_CLINIC_OR_DEPARTMENT_OTHER)
Admission: RE | Admit: 2019-11-17 | Discharge: 2019-11-17 | Disposition: A | Discharge status: Home / Self Care | Payer: Medicare HMO | Source: Ambulatory Visit | Attending: Family Medicine | Admitting: Family Medicine

## 2019-11-17 ENCOUNTER — Encounter (HOSPITAL_BASED_OUTPATIENT_CLINIC_OR_DEPARTMENT_OTHER): Payer: Self-pay

## 2019-11-17 ENCOUNTER — Other Ambulatory Visit: Payer: Self-pay

## 2019-11-17 DIAGNOSIS — Z1231 Encounter for screening mammogram for malignant neoplasm of breast: Secondary | ICD-10-CM

## 2019-11-17 DIAGNOSIS — Z780 Asymptomatic menopausal state: Secondary | ICD-10-CM | POA: Diagnosis not present

## 2019-11-18 ENCOUNTER — Encounter: Payer: Self-pay | Admitting: Family

## 2019-11-18 ENCOUNTER — Telehealth: Payer: Self-pay | Admitting: Family

## 2019-11-18 DIAGNOSIS — M810 Age-related osteoporosis without current pathological fracture: Secondary | ICD-10-CM

## 2019-11-18 HISTORY — DX: Age-related osteoporosis without current pathological fracture: M81.0

## 2019-11-18 MED ORDER — ALENDRONATE SODIUM 70 MG PO TABS: 70.0000 mg | ORAL_TABLET | ORAL | 11 refills | Status: DC

## 2019-11-18 MED ORDER — CALCIUM CARBONATE-VITAMIN D 600-400 MG-UNIT PO CHEW: 1.0000 | CHEWABLE_TABLET | Freq: Two times a day (BID) | ORAL | Status: DC

## 2019-11-18 NOTE — Telephone Encounter (Signed)
Patient advised of results and providers advise. She will pick up medications today or tomorrow. She verbalized understanding;

## 2019-11-18 NOTE — Telephone Encounter (Signed)
Please contact pt and let her know that her bone density shows osteoporosis.   I would recommend that she start fosamax once weekly (sit upright for 90 minutes after taking)  Add caltrate 600mg  + D twice daily.  Ensure regular weight bearing exercise such as walking.   Repeat bone density in 2 years.

## 2019-11-25 ENCOUNTER — Ambulatory Visit (HOSPITAL_BASED_OUTPATIENT_CLINIC_OR_DEPARTMENT_OTHER): Payer: Medicare HMO

## 2019-11-25 ENCOUNTER — Ambulatory Visit (INDEPENDENT_AMBULATORY_CARE_PROVIDER_SITE_OTHER): Payer: Medicare HMO | Admitting: Cardiology

## 2019-11-25 ENCOUNTER — Other Ambulatory Visit: Payer: Self-pay

## 2019-11-25 ENCOUNTER — Encounter: Payer: Self-pay | Admitting: Cardiology

## 2019-11-25 ENCOUNTER — Other Ambulatory Visit (HOSPITAL_BASED_OUTPATIENT_CLINIC_OR_DEPARTMENT_OTHER): Payer: Medicare HMO

## 2019-11-25 ENCOUNTER — Telehealth: Payer: Self-pay | Admitting: Hematology

## 2019-11-25 VITALS — BP 140/80 | HR 89 | Ht 62.0 in | Wt 196.0 lb

## 2019-11-25 DIAGNOSIS — R0989 Other specified symptoms and signs involving the circulatory and respiratory systems: Secondary | ICD-10-CM | POA: Diagnosis not present

## 2019-11-25 DIAGNOSIS — E782 Mixed hyperlipidemia: Secondary | ICD-10-CM | POA: Diagnosis not present

## 2019-11-25 DIAGNOSIS — I10 Essential (primary) hypertension: Secondary | ICD-10-CM

## 2019-11-25 NOTE — Patient Instructions (Signed)
Medication Instructions:  No medication changes *If you need a refill on your cardiac medications before your next appointment, please call your pharmacy*   Lab Work: Your physician recommends that you have labs today. You had a BMET and Mag.  If you have labs (blood work) drawn today and your tests are completely normal, you will receive your results only by: Marland Kitchen MyChart Message (if you have MyChart) OR . A paper copy in the mail If you have any lab test that is abnormal or we need to change your treatment, we will call you to review the results.   Testing/Procedures: Your physician has requested that you have an echocardiogram. Echocardiography is a painless test that uses sound waves to create images of your heart. It provides your doctor with information about the size and shape of your heart and how well your heart's chambers and valves are working. This procedure takes approximately one hour. There are no restrictions for this procedure.     Follow-Up: At [facility], you and your health needs are our priority.  As part of our continuing mission to provide you with exceptional heart care, we have created designated Provider Care Teams.  These Care Teams include your primary Cardiologist (physician) and Advanced Practice Providers (APPs -  Physician Assistants and Nurse Practitioners) who all work together to provide you with the care you need, when you need it.  We recommend signing up for the patient portal called "MyChart".  Sign up information is provided on this After Visit Summary.  MyChart is used to connect with patients for Virtual Visits (Telemedicine).  Patients are able to view lab/test results, encounter notes, upcoming appointments, etc.  Non-urgent messages can be sent to your provider as well.   To learn more about what you can do with MyChart, go to [email or web address].    Your next appointment:   3 month(s)  The format for your next appointment:   In  Person  Provider:   Berniece Salines, DO   Other Instructions  Echocardiogram An echocardiogram is a procedure that uses painless sound waves (ultrasound) to produce an image of the heart. Images from an echocardiogram can provide important information about:  Signs of coronary artery disease (CAD).  Aneurysm detection. An aneurysm is a weak or damaged part of an artery wall that bulges out from the normal force of blood pumping through the body.  Heart size and shape. Changes in the size or shape of the heart can be associated with certain conditions, including heart failure, aneurysm, and CAD.  Heart muscle function.  Heart valve function.  Signs of a past heart attack.  Fluid buildup around the heart.  Thickening of the heart muscle.  A tumor or infectious growth around the heart valves. Tell a health care provider about:  Any allergies you have.  All medicines you are taking, including vitamins, herbs, eye drops, creams, and over-the-counter medicines.  Any blood disorders you have.  Any surgeries you have had.  Any medical conditions you have.  Whether you are pregnant or may be pregnant. What are the risks? Generally, this is a safe procedure. However, problems may occur, including:  Allergic reaction to dye (contrast) that may be used during the procedure. What happens before the procedure? No specific preparation is needed. You may eat and drink normally. What happens during the procedure?   An IV tube may be inserted into one of your veins.  You may receive contrast through this tube. A contrast is an  injection that improves the quality of the pictures from your heart.  A gel will be applied to your chest.  A wand-like tool (transducer) will be moved over your chest. The gel will help to transmit the sound waves from the transducer.  The sound waves will harmlessly bounce off of your heart to allow the heart images to be captured in real-time motion. The  images will be recorded on a computer. The procedure may vary among health care providers and hospitals. What happens after the procedure?  You may return to your normal, everyday life, including diet, activities, and medicines, unless your health care provider tells you not to do that. Summary  An echocardiogram is a procedure that uses painless sound waves (ultrasound) to produce an image of the heart.  Images from an echocardiogram can provide important information about the size and shape of your heart, heart muscle function, heart valve function, and fluid buildup around your heart.  You do not need to do anything to prepare before this procedure. You may eat and drink normally.  After the echocardiogram is completed, you may return to your normal, everyday life, unless your health care provider tells you not to do that. This information is not intended to replace advice given to you by your health care provider. Make sure you discuss any questions you have with your health care provider. Document Revised: 12/04/2018 Document Reviewed: 09/15/2016 Elsevier Patient Education  [place].

## 2019-11-25 NOTE — Progress Notes (Signed)
Cardiology Office Note:    Date:  11/25/2019   ID:  Ruth Jensen, DOB 09-08-1950, MRN [id number]  PCP:  Ruth Alar, NP  Cardiologist:  Ruth Salines, DO  Electrophysiologist:  None   Referring MD: Ruth Alar, NP   The patient is here for follow-up  History of Present Illness:    Ruth Jensen is a 69 y.o. female with pulmonary embolism and DVT on Xarelto, depressed left ventricular ejection fraction, EF 40 to 45%.  Initially presented on August 21, 2019 to be evaluated for chest pain.  During her visit after review of her medical record her echo did show evidence of inferior hypokinesis and with a depressed ejection fraction patient recommended for cardiac catheterization.  She underwent cardiac catheterization on August 03, 2019 which showed normal coronaries.  I last saw the patient via video visit on September 04, 2019 due to inclement weather.  She is here today for follow-up visit she offers no complaints at this time.  She states she has been doing well.  She is looking forward to her birthday which is coming up on April [birth year].  Past Medical History:  Diagnosis Date  . Abnormal echocardiogram 07/22/2019  . Abnormal EKG 07/22/2019  . Acute deep vein thrombosis (DVT) of popliteal vein of right lower extremity ([place]) 07/01/2019  . Acute pulmonary embolism ([place]) 07/02/2019  . Chest pain 07/22/2019  . Depressed left ventricular ejection fraction 07/22/2019  . DVT (deep venous thrombosis) ([place]) 07/01/2019  . Lipid screening 07/22/2019  . Obesity (BMI 30-39.9) 07/22/2019  . Osteoporosis 11/18/2019  . Pulmonary embolism ([place] [employer]) 07/02/2019  . Single subsegmental pulmonary embolism without acute cor pulmonale ([place]) 07/01/2019    Past Surgical History:  Procedure Laterality Date  . ABDOMINAL HYSTERECTOMY  2010   due to fibroids  . ABDOMINAL HYSTERECTOMY    . BREAST BIOPSY Left   . BREAST CYST EXCISION Left   . LEFT HEART CATH AND CORONARY ANGIOGRAPHY N/A 08/03/2019    Procedure: LEFT HEART CATH AND CORONARY ANGIOGRAPHY;  Surgeon: Lorretta Harp, MD;  Location: [facility];  Service: Cardiovascular;  Laterality: N/A;  . OOPHORECTOMY      Current Medications: Current Meds  Medication Sig  . alendronate (FOSAMAX) 70 MG tablet Take 1 tablet (70 mg total) by mouth every 7 (seven) days. Take with a full glass of water on an empty stomach.  . Calcium Carbonate-Vitamin D 600-400 MG-UNIT chew tablet Chew 1 tablet by mouth 2 (two) times daily.  . Cyanocobalamin (B-12 COMPLIANCE INJECTION) 1000 MCG/ML KIT Inject 1,000 mcg as directed every 30 (thirty) days.   Marland Kitchen lisinopril (ZESTRIL) 2.5 MG tablet Take 1 tablet (2.5 mg total) by mouth daily.  . metoprolol succinate (TOPROL XL) 25 MG 24 hr tablet Take 0.5 tablets (12.5 mg total) by mouth daily.  . Multiple Vitamin (MULTIVITAMIN WITH MINERALS) TABS tablet Take 1 tablet by mouth daily.  . rivaroxaban (XARELTO) 20 MG TABS tablet Take 1 tablet (20 mg total) by mouth daily with supper.  . rosuvastatin (CRESTOR) 10 MG tablet Take 1 tablet (10 mg total) by mouth daily.     Allergies:   Patient has no known allergies.   Social History   Socioeconomic History  . Marital status: Married    Spouse name: Ruth Jensen  . Number of children: 2  . Years of education: Not on file  . Highest education level: Not on file  Occupational History  . Occupation: retired   Tobacco Use  . Smoking  status: Never Smoker  . Smokeless tobacco: Never Used  Substance and Sexual Activity  . Alcohol use: Not Currently  . Drug use: Never  . Sexual activity: Not Currently  Other Topics Concern  . Not on file  Social History Narrative  . Not on file   Social Determinants of Health   Financial Resource Strain: Low Risk   . Difficulty of Paying Living Expenses: Not hard at all  Food Insecurity: No Food Insecurity  . Worried About Charity fundraiser in the Last Year: Never true  . Ran Out of Food in the Last Year: Never true   Transportation Needs: No Transportation Needs  . Lack of Transportation (Medical): No  . Lack of Transportation (Non-Medical): No  Physical Activity: Unknown  . Days of Exercise per Week: 0 days  . Minutes of Exercise per Session: Not on file  Stress:   . Feeling of Stress :   Social Connections: Somewhat Isolated  . Frequency of Communication with Friends and Family: More than three times a week  . Frequency of Social Gatherings with Friends and Family: Never  . Attends Religious Services: Never  . Active Member of Clubs or Organizations: No  . Attends Archivist Meetings: Never  . Marital Status: Married     Family History: The patient's family history includes Asthma in her paternal grandfather; CAD in her sister; CVA in her mother; Dementia in her father; Hypertension in her daughter, father, mother, sister, sister, sister, and son; Parkinson's disease in her sister; Skin cancer in her sister.  ROS:   Review of Systems  Constitution: Negative for decreased appetite, fever and weight gain.  HENT: Negative for congestion, ear discharge, hoarse voice and sore throat.   Eyes: Negative for discharge, redness, vision loss in right eye and visual halos.  Cardiovascular: Negative for chest pain, dyspnea on exertion, leg swelling, orthopnea and palpitations.  Respiratory: Negative for cough, hemoptysis, shortness of breath and snoring.   Endocrine: Negative for heat intolerance and polyphagia.  Hematologic/Lymphatic: Negative for bleeding problem. Does not bruise/bleed easily.  Skin: Negative for flushing, nail changes, rash and suspicious lesions.  Musculoskeletal: Negative for arthritis, joint pain, muscle cramps, myalgias, neck pain and stiffness.  Gastrointestinal: Negative for abdominal pain, bowel incontinence, diarrhea and excessive appetite.  Genitourinary: Negative for decreased libido, genital sores and incomplete emptying.  Neurological: Negative for brief  paralysis, focal weakness, headaches and loss of balance.  Psychiatric/Behavioral: Negative for altered mental status, depression and suicidal ideas.  Allergic/Immunologic: Negative for HIV exposure and persistent infections.    EKGs/Labs/Other Studies Reviewed:    The following studies were reviewed today:   EKG: None today  TTE IMPRESSIONS 07/02/2019 1. Left ventricular ejection fraction, by visual estimation, is 40 to 45%. The left ventricle has mildly decreased function. There is mildly increased left ventricular hypertrophy. Diffuse hypokinesis, inferior wall looks regionally worse. 2. Left ventricular diastolic parameters are consistent with Grade I diastolic dysfunction (impaired relaxation). 3. Global right ventricle has mildly reduced systolic function.The right ventricular size is normal. No increase in right ventricular wall thickness. 4. Left atrial size was mildly dilated. 5. Right atrial size was mildly dilated. 6. The mitral valve is normal in structure. Trace mitral valve regurgitation. No evidence of mitral stenosis. 7. The tricuspid valve is normal in structure. Tricuspid valve regurgitation is trivial. 8. The aortic valve is tricuspid. Aortic valve regurgitation is not visualized. No evidence of aortic valve sclerosis or stenosis. 9. The inferior vena cava is  normal in size with greater than 50% respiratory variability, suggesting right atrial pressure of 3 mmHg. 10. The tricuspid regurgitant velocity is 2.33 m/s, and with an assumed right atrial pressure of 3 mmHg, the estimated right ventricular systolic pressure is normal at 24.7 mmHg.   CTA chest IMPRESSION:07/01/2019 CT is positive for acute pulmonary emboli of the left lower lobe segmental and subsegmental vasculature. Coronary artery disease   Recent Labs: 07/01/2019: B Natriuretic Peptide 74.6 07/03/2019: Magnesium 2.2 07/21/2019: ALT 7 07/22/2019: BUN 11; Creatinine, Ser 0.74; Hemoglobin 10.7;  Platelets 340; Potassium 3.5; Sodium 143  Recent Lipid Panel    Component Value Date/Time   CHOL 234 (H) 07/22/2019 1418   TRIG 87 07/22/2019 1418   HDL 51 07/22/2019 1418   CHOLHDL 4.6 (H) 07/22/2019 1418   LDLCALC 168 (H) 07/22/2019 1418    Physical Exam:    VS:  BP 140/80 (BP Location: Left Arm, Patient Position: Sitting, Cuff Size: Large)   Pulse 89   Ht _0  (1.575 m)   Wt 196 lb (88.9 kg)   SpO2 99%   BMI 35.85 kg/m     Wt Readings from Last 3 Encounters:  11/25/19 196 lb (88.9 kg)  11/13/19 199 lb (90.3 kg)  08/03/19 197 lb (89.4 kg)     GEN: Well nourished, well developed in no acute distress HEENT: Normal NECK: No JVD; No carotid bruits LYMPHATICS: No lymphadenopathy CARDIAC: S1S2 noted,RRR, no murmurs, rubs, gallops RESPIRATORY:  Clear to auscultation without rales, wheezing or rhonchi  ABDOMEN: Soft, non-tender, non-distended, +bowel sounds, no guarding. EXTREMITIES: No edema, No cyanosis, no clubbing MUSCULOSKELETAL:  No deformity  SKIN: Warm and dry NEUROLOGIC:  Alert and oriented x 3, non-focal PSYCHIATRIC:  Normal affect, good insight  ASSESSMENT:    1. Depressed left ventricular ejection fraction   2. Essential hypertension   3. Mixed hyperlipidemia    PLAN:     1.  Depressed left ventricle ejection fraction-continue patient on Toprol-XL 25 mg daily, lisinopril 2.5 mg daily.  I am going to repeat her echocardiogram to assess her LV function.  If this has not improved I am going to add Aldactone 12.5 mg daily.   2.  Hypertension-continue her current antihypertensive medication.  3.  Hyperlipidemia-continue Crestor 10 mg daily.   The patient is in agreement with the above plan. The patient left the office in stable condition.  The patient will follow up in 3 months or sooner if needed.   Medication Adjustments/Labs and Tests Ordered: Current medicines are reviewed at length with the patient today.  Concerns regarding medicines are outlined  above.  Orders Placed This Encounter  Procedures  . Basic metabolic panel  . Magnesium  . ECHOCARDIOGRAM COMPLETE   No orders of the defined types were placed in this encounter.   Patient Instructions  Medication Instructions:  No medication changes *If you need a refill on your cardiac medications before your next appointment, please call your pharmacy*   Lab Work: Your physician recommends that you have labs today. You had a BMET and Mag.  If you have labs (blood work) drawn today and your tests are completely normal, you will receive your results only by: Marland Kitchen MyChart Message (if you have MyChart) OR . A paper copy in the mail If you have any lab test that is abnormal or we need to change your treatment, we will call you to review the results.   Testing/Procedures: Your physician has requested that you have an echocardiogram. Echocardiography is  a painless test that uses sound waves to create images of your heart. It provides your doctor with information about the size and shape of your heart and how well your heart's chambers and valves are working. This procedure takes approximately one hour. There are no restrictions for this procedure.     Follow-Up: At [facility], you and your health needs are our priority.  As part of our continuing mission to provide you with exceptional heart care, we have created designated Provider Care Teams.  These Care Teams include your primary Cardiologist (physician) and Advanced Practice Providers (APPs -  Physician Assistants and Nurse Practitioners) who all work together to provide you with the care you need, when you need it.  We recommend signing up for the patient portal called "MyChart".  Sign up information is provided on this After Visit Summary.  MyChart is used to connect with patients for Virtual Visits (Telemedicine).  Patients are able to view lab/test results, encounter notes, upcoming appointments, etc.  Non-urgent messages can be  sent to your provider as well.   To learn more about what you can do with MyChart, go to [email or web address].    Your next appointment:   3 month(s)  The format for your next appointment:   In Person  Provider:   Berniece Salines, DO   Other Instructions  Echocardiogram An echocardiogram is a procedure that uses painless sound waves (ultrasound) to produce an image of the heart. Images from an echocardiogram can provide important information about:  Signs of coronary artery disease (CAD).  Aneurysm detection. An aneurysm is a weak or damaged part of an artery wall that bulges out from the normal force of blood pumping through the body.  Heart size and shape. Changes in the size or shape of the heart can be associated with certain conditions, including heart failure, aneurysm, and CAD.  Heart muscle function.  Heart valve function.  Signs of a past heart attack.  Fluid buildup around the heart.  Thickening of the heart muscle.  A tumor or infectious growth around the heart valves. Tell a health care provider about:  Any allergies you have.  All medicines you are taking, including vitamins, herbs, eye drops, creams, and over-the-counter medicines.  Any blood disorders you have.  Any surgeries you have had.  Any medical conditions you have.  Whether you are pregnant or may be pregnant. What are the risks? Generally, this is a safe procedure. However, problems may occur, including:  Allergic reaction to dye (contrast) that may be used during the procedure. What happens before the procedure? No specific preparation is needed. You may eat and drink normally. What happens during the procedure?   An IV tube may be inserted into one of your veins.  You may receive contrast through this tube. A contrast is an injection that improves the quality of the pictures from your heart.  A gel will be applied to your chest.  A wand-like tool (transducer) will be moved over  your chest. The gel will help to transmit the sound waves from the transducer.  The sound waves will harmlessly bounce off of your heart to allow the heart images to be captured in real-time motion. The images will be recorded on a computer. The procedure may vary among health care providers and hospitals. What happens after the procedure?  You may return to your normal, everyday life, including diet, activities, and medicines, unless your health care provider tells you not to do that. Summary  An echocardiogram is a procedure that uses painless sound waves (ultrasound) to produce an image of the heart.  Images from an echocardiogram can provide important information about the size and shape of your heart, heart muscle function, heart valve function, and fluid buildup around your heart.  You do not need to do anything to prepare before this procedure. You may eat and drink normally.  After the echocardiogram is completed, you may return to your normal, everyday life, unless your health care provider tells you not to do that. This information is not intended to replace advice given to you by your health care provider. Make sure you discuss any questions you have with your health care provider. Document Revised: 12/04/2018 Document Reviewed: 09/15/2016 Elsevier Patient Education  [place].      Adopting a Healthy Lifestyle.  Know what a healthy weight is for you (roughly BMI <25) and aim to maintain this   Aim for 7+ servings of fruits and vegetables daily   65-80+ fluid ounces of water or unsweet tea for healthy kidneys   Limit to max 1 drink of alcohol per day; avoid smoking/tobacco   Limit animal fats in diet for cholesterol and heart health - choose grass fed whenever available   Avoid highly processed foods, and foods high in saturated/trans fats   Aim for low stress - take time to unwind and care for your mental health   Aim for 150 min of moderate intensity  exercise weekly for heart health, and weights twice weekly for bone health   Aim for 7-9 hours of sleep daily   When it comes to diets, agreement about the perfect plan isnt easy to find, even among the experts. Experts at the [employer] developed an idea known as the Healthy Eating Plate. Just imagine a plate divided into logical, healthy portions.   The emphasis is on diet quality:   Load up on vegetables and fruits - one-half of your plate: Aim for color and variety, and remember that potatoes dont count.   Go for whole grains - one-quarter of your plate: Whole wheat, barley, wheat berries, quinoa, oats, brown rice, and foods made with them. If you want pasta, go with whole wheat pasta.   Protein power - one-quarter of your plate: Fish, chicken, beans, and nuts are all healthy, versatile protein sources. Limit red meat.   The diet, however, does go beyond the plate, offering a few other suggestions.   Use healthy plant oils, such as olive, canola, soy, corn, sunflower and peanut. Check the labels, and avoid partially hydrogenated oil, which have unhealthy trans fats.   If youre thirsty, drink water. Coffee and tea are good in moderation, but skip sugary drinks and limit milk and dairy products to one or two daily servings.   The type of carbohydrate in the diet is more important than the amount. Some sources of carbohydrates, such as vegetables, fruits, whole grains, and beans-are healthier than others.   Finally, stay active  Signed, Ruth Salines, DO  11/25/2019 10:11 AM    [place]

## 2019-11-25 NOTE — Telephone Encounter (Signed)
Called and LMVM for patient w/ appointment date/time that has been rescheduled per  3/31 secure chat

## 2019-11-26 LAB — BASIC METABOLIC PANEL
BUN/Creatinine Ratio: 16 (ref 12–28)
BUN: 11 mg/dL (ref 8–27)
CO2: 25 mmol/L (ref 20–29)
Calcium: 9.2 mg/dL (ref 8.7–10.3)
Chloride: 108 mmol/L — ABNORMAL HIGH (ref 96–106)
Creatinine, Ser: 0.67 mg/dL (ref 0.57–1.00)
GFR calc Af Amer: 104 mL/min/{1.73_m2} (ref >59)
GFR calc non Af Amer: 90 mL/min/{1.73_m2} (ref >59)
Glucose: 85 mg/dL (ref 65–99)
Potassium: 4.2 mmol/L (ref 3.5–5.2)
Sodium: 145 mmol/L — ABNORMAL HIGH (ref 134–144)

## 2019-11-26 LAB — MAGNESIUM: Magnesium: 2.2 mg/dL (ref 1.6–2.3)

## 2019-11-27 ENCOUNTER — Ambulatory Visit (HOSPITAL_BASED_OUTPATIENT_CLINIC_OR_DEPARTMENT_OTHER)
Admission: RE | Admit: 2019-11-27 | Discharge: 2019-11-27 | Disposition: A | Discharge status: Home / Self Care | Payer: Medicare HMO | Source: Ambulatory Visit | Attending: Cardiology | Admitting: Cardiology

## 2019-11-27 ENCOUNTER — Other Ambulatory Visit: Payer: Self-pay

## 2019-11-27 DIAGNOSIS — R0989 Other specified symptoms and signs involving the circulatory and respiratory systems: Secondary | ICD-10-CM | POA: Insufficient documentation

## 2019-11-27 DIAGNOSIS — R079 Chest pain, unspecified: Secondary | ICD-10-CM | POA: Insufficient documentation

## 2019-11-27 DIAGNOSIS — R9431 Abnormal electrocardiogram [ECG] [EKG]: Secondary | ICD-10-CM | POA: Diagnosis not present

## 2019-11-27 NOTE — Progress Notes (Signed)
  Echocardiogram 2D Echocardiogram has been performed.  Cardell Peach 11/27/2019, 1:35 PM

## 2019-11-30 ENCOUNTER — Ambulatory Visit (HOSPITAL_BASED_OUTPATIENT_CLINIC_OR_DEPARTMENT_OTHER): Payer: Medicare HMO

## 2019-11-30 ENCOUNTER — Other Ambulatory Visit (HOSPITAL_BASED_OUTPATIENT_CLINIC_OR_DEPARTMENT_OTHER): Payer: Medicare HMO

## 2019-11-30 ENCOUNTER — Telehealth: Payer: Self-pay

## 2019-11-30 NOTE — Telephone Encounter (Signed)
Spoke with patient regarding results.  Patient verbalizes understanding and is agreeable to plan of care. Advised patient to call back with any issues or concerns.  

## 2019-11-30 NOTE — Telephone Encounter (Signed)
-----   Message from Berniece Salines, DO sent at 11/28/2019  2:38 PM EDT ----- Echocardiogram showed EF has improved compared to the November 2020.  Good results

## 2019-12-07 ENCOUNTER — Inpatient Hospital Stay (HOSPITAL_BASED_OUTPATIENT_CLINIC_OR_DEPARTMENT_OTHER): Payer: Medicare HMO | Admitting: Hematology

## 2019-12-07 ENCOUNTER — Encounter: Payer: Self-pay | Admitting: Hematology

## 2019-12-07 ENCOUNTER — Telehealth: Payer: Self-pay | Admitting: Hematology

## 2019-12-07 ENCOUNTER — Other Ambulatory Visit: Payer: Self-pay

## 2019-12-07 ENCOUNTER — Inpatient Hospital Stay: Payer: Medicare HMO | Attending: Hematology

## 2019-12-07 ENCOUNTER — Encounter: Payer: Self-pay | Admitting: Gastroenterology

## 2019-12-07 VITALS — BP 133/80 | HR 93 | Temp 97.7°F | Resp 18 | Ht 62.0 in | Wt 199.0 lb

## 2019-12-07 DIAGNOSIS — I82431 Acute embolism and thrombosis of right popliteal vein: Secondary | ICD-10-CM

## 2019-12-07 DIAGNOSIS — Z7901 Long term (current) use of anticoagulants: Secondary | ICD-10-CM | POA: Diagnosis not present

## 2019-12-07 DIAGNOSIS — I82401 Acute embolism and thrombosis of unspecified deep veins of right lower extremity: Secondary | ICD-10-CM | POA: Diagnosis not present

## 2019-12-07 DIAGNOSIS — I2699 Other pulmonary embolism without acute cor pulmonale: Secondary | ICD-10-CM

## 2019-12-07 DIAGNOSIS — D509 Iron deficiency anemia, unspecified: Secondary | ICD-10-CM | POA: Insufficient documentation

## 2019-12-07 LAB — CBC WITH DIFFERENTIAL (CANCER CENTER ONLY)
Abs Immature Granulocytes: 0.03 10*3/uL (ref 0.00–0.07)
Basophils Absolute: 0 10*3/uL (ref 0.0–0.1)
Basophils Relative: 1 %
Eosinophils Absolute: 0.3 10*3/uL (ref 0.0–0.5)
Eosinophils Relative: 3 %
HCT: 37.7 % (ref 36.0–46.0)
Hemoglobin: 11.8 g/dL — ABNORMAL LOW (ref 12.0–15.0)
Immature Granulocytes: 0 %
Lymphocytes Relative: 24 %
Lymphs Abs: 2 10*3/uL (ref 0.7–4.0)
MCH: 24.5 pg — ABNORMAL LOW (ref 26.0–34.0)
MCHC: 31.3 g/dL (ref 30.0–36.0)
MCV: 78.2 fL — ABNORMAL LOW (ref 80.0–100.0)
Monocytes Absolute: 0.7 10*3/uL (ref 0.1–1.0)
Monocytes Relative: 8 %
Neutro Abs: 5.2 10*3/uL (ref 1.7–7.7)
Neutrophils Relative %: 64 %
Platelet Count: 329 10*3/uL (ref 150–400)
RBC: 4.82 MIL/uL (ref 3.87–5.11)
RDW: 15.3 % (ref 11.5–15.5)
WBC Count: 8.3 10*3/uL (ref 4.0–10.5)
nRBC: 0 % (ref 0.0–0.2)

## 2019-12-07 LAB — CMP (CANCER CENTER ONLY)
ALT: 10 U/L (ref 0–44)
AST: 14 U/L — ABNORMAL LOW (ref 15–41)
Albumin: 4.1 g/dL (ref 3.5–5.0)
Alkaline Phosphatase: 104 U/L (ref 38–126)
Anion gap: 6 (ref 5–15)
BUN: 14 mg/dL (ref 8–23)
CO2: 29 mmol/L (ref 22–32)
Calcium: 9.6 mg/dL (ref 8.9–10.3)
Chloride: 108 mmol/L (ref 98–111)
Creatinine: 0.72 mg/dL (ref 0.44–1.00)
GFR, Est AFR Am: >60 mL/min (ref >60)
GFR, Estimated: >60 mL/min (ref >60)
Glucose, Bld: 110 mg/dL — ABNORMAL HIGH (ref 70–99)
Potassium: 3.5 mmol/L (ref 3.5–5.1)
Sodium: 143 mmol/L (ref 135–145)
Total Bilirubin: 0.5 mg/dL (ref 0.3–1.2)
Total Protein: 7.5 g/dL (ref 6.5–8.1)

## 2019-12-07 LAB — D-DIMER, QUANTITATIVE: D-Dimer, Quant: <0.27 ug/mL-FEU (ref 0.00–0.50)

## 2019-12-07 NOTE — Telephone Encounter (Signed)
Appointments scheduled calendar printed per 4/12 los.  I also walked patient over to GI office to schedule appt per Dr Lorette Ang referral

## 2019-12-07 NOTE — Progress Notes (Signed)
[facility] OFFICE PROGRESS NOTE  Patient Care Team: [name], NP as PCP - General (Internal Medicine) Berniece Salines, DO as PCP - Cardiology (Cardiology)  HEME/ONC OVERVIEW: 1. 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 -Should she need any interruption of the anticoagulation for elective procedures in the future, feel free to contact me regarding peri-operative management.  Iron deficiency anemia -Etiology unclear; iron profile in 06/2019 c/w IDA, normal B12 and folate -Hgb 11.8 today, stable  -Patient was referred to GI for further evaluation, but did not return calls to GI for follow-up  -Given the unexplained iron deficiency anemia and she is overdue for her colonoscopy, I strongly recommended the patient to follow up with GI for appropriate GI work-up -Patient agreed to the plan, and I have re-ordered the GI referral  -I have also ordered repeat iron panel for the next visit   Orders Placed This Encounter  Procedures  . CBC with Differential (Cancer Center Only)    Standing Status:   Future    Standing Expiration Date:   01/10/2021  . CMP ([facility])    Standing Status:   Future    Standing  Expiration Date:   01/10/2021  . [facility]  Standing Status:   Future    Standing Expiration Date:   01/10/2021  . Ferritin    Standing Status:   Future    Standing Expiration Date:   01/10/2021  . Iron and TIBC    Standing Status:   Future    Standing Expiration Date:   01/10/2021  . Ambulatory referral to Gastroenterology    Referral Priority:   Routine    Referral Type:   Consultation    Referral Reason:   Specialty Services Required    Referred to Provider:   CirLavena BullionO    Number of Visits Requested:   1   The total time spent in the encounter was 35 minutes, including face-to-face time with the patient, review of various tests results, order additional studies/medications, documentation, and coordination of care plan.   All questions were answered. The patient knows to call the clinic with any problems, questions or concerns. No barriers to learning was detected.  Return in 6 months for labs and clinic follow-up.   Ruth Jensen 4/12/202111:55 AM  CHIEF COMPLAINT: "I am doing fine"  INTERVAL HISTORY: [name] to clinic for follow-up of history of left lung PTE and right lower extremity DVT on Xarelto.  Patient reports that she was approved for Xarelto medication assistance program, and is currently receiving Xarelto for free.  She is tolerating the medication well, and denies any abnormal bleeding or excess bruising.  She did not remember being contacted by gastroenterology, but the review of the documents showed  that patient did not respond to phone calls from gastroenterology to set up a clinic appointment.  Patient denies any other complaint today.  REVIEW OF SYSTEMS:   Constitutional: ( - ) fevers, ( - )  chills , ( - ) night sweats Eyes: ( - ) blurriness of vision, ( - ) double vision, ( - ) watery eyes Ears, nose, mouth, throat, and face: ( - ) mucositis, ( - ) sore throat Respiratory: ( - ) cough, ( - ) dyspnea, ( - )  wheezes Cardiovascular: ( - ) palpitation, ( - ) chest discomfort, ( - ) lower extremity swelling Gastrointestinal:  ( - ) nausea, ( - ) heartburn, ( - ) change in bowel habits Skin: ( - ) abnormal skin rashes Lymphatics: ( - ) new lymphadenopathy, ( - ) easy bruising Neurological: ( - ) numbness, ( - ) tingling, ( - ) new weaknesses Behavioral/Psych: ( - ) mood change, ( - ) new changes  All other systems were reviewed with the patient and are negative.  SUMMARY OF ONCOLOGIC HISTORY: Oncology History   No history exists.    I have reviewed the past medical history, past surgical history, social history and family history with the patient and they are unchanged from previous note.  ALLERGIES:  has No Known Allergies.  MEDICATIONS:  Current Outpatient Medications  Medication Sig Dispense Refill  . alendronate (FOSAMAX) 70 MG tablet Take 1 tablet (70 mg total) by mouth every 7 (seven) days. Take with a full glass of water on an empty stomach. 4 tablet 11  . Calcium Carbonate-Vitamin D 600-400 MG-UNIT chew tablet Chew 1 tablet by mouth 2 (two) times daily.    . Cyanocobalamin (B-12 COMPLIANCE INJECTION) 1000 MCG/ML KIT Inject 1,000 mcg as directed every 30 (thirty) days.     Marland Kitchen lisinopril (ZESTRIL) 2.5 MG tablet Take 1 tablet (2.5 mg total) by mouth daily. 90 tablet 1  . metoprolol succinate (TOPROL XL) 25 MG 24 hr tablet Take 0.5 tablets (12.5 mg total) by mouth daily. 45 tablet 1  . Multiple Vitamin (MULTIVITAMIN WITH MINERALS) TABS tablet Take 1 tablet by mouth daily. 30 tablet 0  . rivaroxaban (XARELTO) 20 MG TABS tablet Take 1 tablet (20 mg total) by mouth daily with supper. 30 tablet 11  . rosuvastatin (CRESTOR) 10 MG tablet Take 1 tablet (10 mg total) by mouth daily. 30 tablet 3  . nitroGLYCERIN (NITROSTAT) 0.4 MG SL tablet Place 1 tablet (0.4 mg total) under the tongue every 5 (five) minutes as needed. 30 tablet 3   No current facility-administered medications for this visit.     PHYSICAL EXAMINATION: ECOG PERFORMANCE STATUS: 1 - Symptomatic but completely ambulatory  Today's Vitals   12/07/19 1141  BP: 133/80  Pulse: 93  Resp: 18  Temp: 97.7 F (36.5 C)  TempSrc: Temporal  SpO2: 100%  Weight: 199 lb 0.6 oz (90.3 kg)  Height: 5' 2"  (1.575 m)  PainSc: 0-No pain   Body mass index is 36.4 kg/m.  Filed Weights   12/07/19 1141  Weight: 199 lb 0.6 oz (90.3 kg)    GENERAL: alert, no distress and comfortable SKIN: skin color, texture, turgor are normal, no rashes or significant lesions EYES: conjunctiva are pink and non-injected, sclera clear OROPHARYNX: no exudate, no erythema; lips, buccal mucosa, and tongue normal  NECK: supple, non-tender LUNGS: clear to auscultation with normal breathing effort HEART: regular rate & rhythm and no murmurs and no lower extremity edema ABDOMEN: soft, non-tender, non-distended,  normal bowel sounds Musculoskeletal: no cyanosis of digits and no clubbing  PSYCH: alert & oriented x 3, fluent speech  LABORATORY DATA:  I have reviewed the data as listed    Component Value Date/Time   NA 143 12/07/2019 1052   NA 145 (H) 11/25/2019 1012   K 3.5 12/07/2019 1052   CL 108 12/07/2019 1052   CO2 29 12/07/2019 1052   GLUCOSE 110 (H) 12/07/2019 1052   BUN 14 12/07/2019 1052   BUN 11 11/25/2019 1012   CREATININE 0.72 12/07/2019 1052   CALCIUM 9.6 12/07/2019 1052   PROT 7.5 12/07/2019 1052   ALBUMIN 4.1 12/07/2019 1052   AST 14 (L) 12/07/2019 1052   ALT 10 12/07/2019 1052   ALKPHOS 104 12/07/2019 1052   BILITOT 0.5 12/07/2019 1052   GFRNONAA >60 12/07/2019 1052   GFRAA >60 12/07/2019 1052    No results found for: SPEP, UPEP  Lab Results  Component Value Date   WBC 8.3 12/07/2019   NEUTROABS 5.2 12/07/2019   HGB 11.8 (L) 12/07/2019   HCT 37.7 12/07/2019   MCV 78.2 (L) 12/07/2019   PLT 329 12/07/2019      Chemistry      Component Value Date/Time   NA 143 12/07/2019 1052   NA 145 (H) 11/25/2019 1012   K  3.5 12/07/2019 1052   CL 108 12/07/2019 1052   CO2 29 12/07/2019 1052   BUN 14 12/07/2019 1052   BUN 11 11/25/2019 1012   CREATININE 0.72 12/07/2019 1052      Component Value Date/Time   CALCIUM 9.6 12/07/2019 1052   ALKPHOS 104 12/07/2019 1052   AST 14 (L) 12/07/2019 1052   ALT 10 12/07/2019 1052   BILITOT 0.5 12/07/2019 1052       RADIOGRAPHIC STUDIES: I have personally reviewed the radiological images as listed below and agreed with the findings in the report. DG Bone Density  Result Date: 11/17/2019 EXAM: DUAL X-RAY ABSORPTIOMETRY (DXA) FOR BONE MINERAL DENSITY IMPRESSION: STACEY A BLYTH Your patient Ruth Jensen completed a BMD test on 11/17/2019 using the Helen (analysis version: 16.SP2) manufactured by [employer]. The following summarizes the results of our evaluation. [facility] PATIENT: Name: Carman, Auxier Patient ID:  [id number] Birth Date: February 17, 1951 Height:     61.2 in. Gender:      Female Measured:   11/17/2019 Weight:     197.6 lbs. Indications: African-American, Estrogen Deficiency, Hysterectomy, Oophorectomy ( Bilateral), Post Menopausal Fractures: Treatments: Calcium ASSESSMENT: The BMD measured at Femur Neck Left is 0.668 g/cm2 with a T-score of -2.7. This patient is considered OSTEOPOROTIC according to [facility]) criteria. L1 was excluded due to degenerative changes. The scan quality is good. Site Region Measured Date Measured Age WHO YA BMD Classification T-score AP Spine L2-L4 11/17/2019 69.9 Low Bone Mass -1.8 0.978 g/cm2 DualFemur Neck Left 10/17/2019 69.9 [age] Osteoporosis -2.7 0.668 g/cm2 World Health Organization [facility]) criteria for post-menopausal, Caucasian Women: Normal        T-score at or above -1 SD Low Bone Mass T-score between -1 and -2.5 SD Osteoporosis  T-score at or below -2.5 SD RECOMMENDATION:1. All patients should optimize calcium and vitamin D intake. 2. Consider FDA-approved medical therapies in postmenopausal women and  men aged 44 years and older, based on the following: a. A hip or vertebral(clinical or morphometric) fracture. b. T-score < -2.5 at the femoral neck or spine after appropriate evaluation to exclude secondary causes c. Low bone mass (T-score between -  1.0 and -2.5 at the femoral neck or spine) and a 10 year probability of a hip fracture >3% or a 10 year probability of major osteoporosis-related fracture > 20% based on the [place]-adapted WHO algorithm d. Clinical judgement and/or patient preferences may indicate treatment for people with 10-year fracture probabilities above or below these levels FOLLOW-UP: Patients with diagnosis of osteoporosis or at high risk for fracture should have regular bone mineral density tests. For patients eligible for Medicare, routine testing is allowed once every 2 years. The testing frequency can be increased to one year for patients who have rapidly progressing disease, those who are receiving or discontinuing medical therapy to restore bone mass, or have additional risk factors. I have reviewed this report and agree with the above findings. Mark A. Thornton Papas, M.D. [facility] Radiology Electronically Signed   By: Lavonia Dana M.D.   On: 11/17/2019 10:19   MM 3D SCREEN BREAST BILATERAL  Result Date: 11/17/2019 CLINICAL DATA:  Screening. EXAM: DIGITAL SCREENING BILATERAL MAMMOGRAM WITH TOMO AND CAD COMPARISON:  None. ACR Breast Density Category b: There are scattered areas of fibroglandular density. FINDINGS: There are no findings suspicious for malignancy. Images were processed with CAD. IMPRESSION: No mammographic evidence of malignancy. A result letter of this screening mammogram will be mailed directly to the patient. RECOMMENDATION: Screening mammogram in one year. (Code:SM-B-01Y) BI-RADS CATEGORY  1: Negative. Electronically Signed   By: Lajean Manes M.D.   On: 11/17/2019 14:09   ECHOCARDIOGRAM COMPLETE  Result Date: 11/27/2019    ECHOCARDIOGRAM REPORT   Patient Name:   Ruth Jensen  Date of Exam: 11/27/2019 Medical Rec #:  [id number]     Height:       62.0 in Accession #:    [id number]    Weight:       196.0 lb Date of Birth:  01-13-1951     BSA:          1.895 m Patient Age:    25 years      BP:           140/80 mmHg Patient Gender: F             HR:           79 bpm. Exam Location:  [place] Procedure: 2D Echo, Cardiac Doppler and Color Doppler Indications:    Depressed LV EF  History:        Patient has prior history of Echocardiogram examinations, most                 recent 07/02/2019. Abnormal ECG; Signs/Symptoms:Chest Pain.  Sonographer:    Cardell Peach RDCS (AE) Referring Phys: [id number] [place]  1. Left ventricular ejection fraction, by estimation, is 55 to 60%. The left ventricle has normal function. The left ventricle has no regional wall motion abnormalities. Left ventricular diastolic parameters are consistent with Grade I diastolic dysfunction (impaired relaxation).  2. Right ventricular systolic function is normal. The right ventricular size is normal. There is mildly elevated pulmonary artery systolic pressure.  3. The mitral valve is normal in structure. No evidence of mitral valve regurgitation. No evidence of mitral stenosis.  4. The aortic valve is normal in structure. Aortic valve regurgitation is not visualized. No aortic stenosis is present.  5. The inferior vena cava is normal in size with greater than 50% respiratory variability, suggesting right atrial pressure of 3 mmHg. Comparison(s): Changes from prior study are noted. Compared to Study done on 07/02/2019 the  ejection fraction has improved from 40-45% now 55-60%. FINDINGS  Left Ventricle: Left ventricular ejection fraction, by estimation, is 55 to 60%. The left ventricle has normal function. The left ventricle has no regional wall motion abnormalities. The left ventricular internal cavity size was normal in size. There is  no left ventricular hypertrophy. Left ventricular diastolic parameters are  consistent with Grade I diastolic dysfunction (impaired relaxation). Right Ventricle: The right ventricular size is normal. No increase in right ventricular wall thickness. Right ventricular systolic function is normal. There is mildly elevated pulmonary artery systolic pressure. The tricuspid regurgitant velocity is 2.66  m/s, and with an assumed right atrial pressure of 3 mmHg, the estimated right ventricular systolic pressure is 62.5 mmHg. Left Atrium: Left atrial size was normal in size. Right Atrium: Right atrial size was normal in size. Pericardium: There is no evidence of pericardial effusion. Mitral Valve: The mitral valve is normal in structure. Normal mobility of the mitral valve leaflets. No evidence of mitral valve regurgitation. No evidence of mitral valve stenosis. Tricuspid Valve: The tricuspid valve is normal in structure. Tricuspid valve regurgitation is trivial. No evidence of tricuspid stenosis. Aortic Valve: The aortic valve is normal in structure. Aortic valve regurgitation is not visualized. No aortic stenosis is present. Pulmonic Valve: The pulmonic valve was not well visualized. Pulmonic valve regurgitation is not visualized. No evidence of pulmonic stenosis. Aorta: The aortic root is normal in size and structure. Venous: The inferior vena cava is normal in size with greater than 50% respiratory variability, suggesting right atrial pressure of 3 mmHg. IAS/Shunts: No atrial level shunt detected by color flow Doppler.  LEFT VENTRICLE PLAX 2D LVIDd:         3.78 cm  Diastology LVIDs:         2.69 cm  LV e' lateral:   7.40 cm/s LV PW:         1.10 cm  LV E/e' lateral: 10.2 LV IVS:        1.09 cm  LV e' medial:    5.55 cm/s LVOT diam:     2.10 cm  LV E/e' medial:  13.6 LV SV:         71 LV SV Index:   38 LVOT Area:     3.46 cm  RIGHT VENTRICLE            IVC RV Basal diam:  3.29 cm    IVC diam: 1.61 cm RV S prime:     8.81 cm/s TAPSE (M-mode): 2.4 cm LEFT ATRIUM             Index       RIGHT  ATRIUM           Index LA diam:        3.40 cm 1.79 cm/m  RA Area:     20.30 cm LA Vol (A2C):   50.0 ml 26.38 ml/m RA Volume:   57.70 ml  30.44 ml/m LA Vol (A4C):   65.9 ml 34.77 ml/m LA Biplane Vol: 57.6 ml 30.39 ml/m  AORTIC VALVE LVOT Vmax:   90.60 cm/s LVOT Vmean:  62.500 cm/s LVOT VTI:    0.206 m  AORTA Ao Root diam: 2.90 cm Ao Asc diam:  3.70 cm MITRAL VALVE                TRICUSPID VALVE MV Area (PHT): 3.23 cm     TR Peak grad:   28.3 mmHg MV Decel Time: 235 msec  TR Vmax:        266.00 cm/s MV E velocity: 75.70 cm/s MV A velocity: 104.00 cm/s  SHUNTS MV E/A ratio:  0.73         Systemic VTI:  0.21 m                             Systemic Diam: 2.10 cm Kardie Tobb DO Electronically signed by Berniece Salines DO Signature Date/Time: 11/27/2019/5:17:52 PM    Final

## 2019-12-16 ENCOUNTER — Other Ambulatory Visit (INDEPENDENT_AMBULATORY_CARE_PROVIDER_SITE_OTHER): Payer: Medicare HMO

## 2019-12-16 ENCOUNTER — Other Ambulatory Visit: Payer: Self-pay

## 2019-12-16 ENCOUNTER — Ambulatory Visit (INDEPENDENT_AMBULATORY_CARE_PROVIDER_SITE_OTHER): Payer: Medicare HMO

## 2019-12-16 DIAGNOSIS — E538 Deficiency of other specified B group vitamins: Secondary | ICD-10-CM | POA: Diagnosis not present

## 2019-12-16 DIAGNOSIS — E785 Hyperlipidemia, unspecified: Secondary | ICD-10-CM

## 2019-12-16 LAB — VITAMIN B12: Vitamin B-12: 216 pg/mL (ref 211–911)

## 2019-12-16 LAB — LIPID PANEL
Cholesterol: 156 mg/dL (ref 0–200)
HDL: 50.7 mg/dL (ref >39.00)
LDL Cholesterol: 92 mg/dL (ref 0–99)
NonHDL: 105.12
Total CHOL/HDL Ratio: 3
Triglycerides: 68 mg/dL (ref 0.0–149.0)
VLDL: 13.6 mg/dL (ref 0.0–40.0)

## 2019-12-16 MED ORDER — CYANOCOBALAMIN 1000 MCG/ML IJ SOLN
1000.0000 ug | Freq: Once | INTRAMUSCULAR | Status: AC
  Administered 2019-12-16: 1000 ug via INTRAMUSCULAR

## 2019-12-16 NOTE — Progress Notes (Addendum)
Pt here today for B12 injection.   Cyanocobalamin 1053mcg/mL- 36mL injected into L deltoid.   Next injection 1 month.   CMA note reviewed and agree.   [name] NP

## 2019-12-17 ENCOUNTER — Telehealth: Payer: Self-pay | Admitting: Family

## 2019-12-17 DIAGNOSIS — E538 Deficiency of other specified B group vitamins: Secondary | ICD-10-CM

## 2019-12-17 NOTE — Telephone Encounter (Signed)
Pt.notified

## 2019-12-17 NOTE — Telephone Encounter (Signed)
Please advise pt that vitamin b12 level is still low. Looks like she had b12 shot yesterday.  I would like her to do b12 1044mcg IM once weekly for 3 more weeks, then change to 1025mcg IM monthly. Repeat b12 level in 3  Months.

## 2019-12-30 ENCOUNTER — Telehealth: Payer: Self-pay

## 2019-12-30 ENCOUNTER — Encounter: Payer: Self-pay | Admitting: Gastroenterology

## 2019-12-30 ENCOUNTER — Ambulatory Visit: Payer: Medicare HMO | Admitting: Gastroenterology

## 2019-12-30 ENCOUNTER — Other Ambulatory Visit: Payer: Self-pay

## 2019-12-30 VITALS — BP 136/82 | HR 88 | Temp 98.0°F | Ht 62.0 in | Wt 201.2 lb

## 2019-12-30 DIAGNOSIS — D509 Iron deficiency anemia, unspecified: Secondary | ICD-10-CM

## 2019-12-30 DIAGNOSIS — Z86718 Personal history of other venous thrombosis and embolism: Secondary | ICD-10-CM

## 2019-12-30 DIAGNOSIS — Z86711 Personal history of pulmonary embolism: Secondary | ICD-10-CM

## 2019-12-30 DIAGNOSIS — E538 Deficiency of other specified B group vitamins: Secondary | ICD-10-CM | POA: Diagnosis not present

## 2019-12-30 DIAGNOSIS — Z7901 Long term (current) use of anticoagulants: Secondary | ICD-10-CM

## 2019-12-30 MED ORDER — CLENPIQ 10-3.5-12 MG-GM -GM/160ML PO SOLN: 1.0000 | Freq: Once | ORAL | 0 refills | Status: AC

## 2019-12-30 NOTE — Telephone Encounter (Signed)
     Request for surgical clearance:     Endoscopy Procedure  What type of surgery is being performed?     EGD/Colonoscopy  When is this surgery scheduled?     01/12/20  What type of clearance is required ?   Pharmacy  Are there any medications that need to be held prior to surgery and how long? Xarelto  Practice name and name of physician performing surgery?      [facility]  What is your office phone and fax number?      Phone- [phone]  [phone]  Anesthesia type (None, local, MAC, general) ?       MAC

## 2019-12-30 NOTE — Progress Notes (Signed)
Chief Complaint: Iron deficiency anemia  Referring Provider:     [name], MD   HPI:    Ruth Jensen is a 69 y.o. 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  She is o/w without any GI sxs. No hematochezia, melena, change in bowel habits, n/v/d/c, abdominal pain.   07/01/2019 (admission for DVT/PE): -H/H 11/34, MCV/RDW 100.3/14.6 -Ferritin 62, iron 63, TIBC 369, sat 17% -B12 <50, normal folate -K3.2, otherwise normal BMP -Albumin 3.3, otherwise normal liver enzymes  07/21/2019: -H/H 11.1/34, MCV/RDW 97/15 -Ferritin 9, iron 41, TIBC 360, sat 11% -B12>7500  12/07/2019: -H/H 11.8/37.7, MCV/RDW 78.2/15.3 -B12 260 -Iron panel ordered; not yet done -Normal CMP  Only prior comparison lab was H/H 12/37.4 and MCV/RDW 84.8/16.6 in 02/2018.  No previous EGD.  Last colonoscopy was well over 10 years ago in [facility], and normal to her knowledge.  No known family history of CRC, GI malignancy, liver disease, pancreatic disease, or IBD.    Past Medical History:  Diagnosis Date  . Abnormal echocardiogram 07/22/2019  . Abnormal EKG 07/22/2019  . Acute deep vein thrombosis (DVT) of popliteal vein of right lower extremity ([place]) 07/01/2019  . Acute pulmonary embolism ([place]) 07/02/2019  . Chest pain 07/22/2019  . Depressed left ventricular ejection fraction 07/22/2019  . DVT (deep venous thrombosis) ([place]) 07/01/2019  . Hypertension   . Lipid screening 07/22/2019  . Obesity (BMI 30-39.9) 07/22/2019  . Osteoporosis 11/18/2019  . Pulmonary embolism ([place]) 07/02/2019  . Single subsegmental pulmonary embolism without acute cor pulmonale ([place]) 07/01/2019     Past Surgical History:  Procedure Laterality Date  .  ABDOMINAL HYSTERECTOMY  2010   due to fibroids  . ABDOMINAL HYSTERECTOMY    . BREAST BIOPSY Left   . BREAST CYST EXCISION Left   . [facility]. Late 2000's   . LEFT HEART CATH AND CORONARY ANGIOGRAPHY N/A 08/03/2019   Procedure: LEFT HEART CATH AND CORONARY ANGIOGRAPHY;  Surgeon: Lorretta Harp, MD;  Location: [facility];  Service: Cardiovascular;  Laterality: N/A;  . OOPHORECTOMY     Family History  Problem Relation Age of Onset  . Hypertension Mother   . CVA Mother        hemiparesis  . Hypertension Father   . Dementia Father   . Prostate cancer Father   . Hypertension Sister   . CAD Sister        scheduled for pacemaker  . Asthma Paternal Grandfather   . Hypertension Sister   . Skin cancer Sister        melanoma died at age 18  . Hypertension Sister        ? PFO repair  . Parkinson's disease Sister   . Hypertension Daughter   . Hypertension Son        pacemaker  . Colon cancer Neg Hx   . Esophageal cancer Neg Hx    Social History   Tobacco Use  . Smoking status: Never Smoker  . Smokeless tobacco: Never Used  Substance Use Topics  . Alcohol use: Not Currently  . Drug use: Never   Current Outpatient Medications  Medication Sig Dispense Refill  . alendronate (  FOSAMAX) 70 MG tablet Take 1 tablet (70 mg total) by mouth every 7 (seven) days. Take with a full glass of water on an empty stomach. 4 tablet 11  . Calcium Carbonate-Vitamin D 600-400 MG-UNIT chew tablet Chew 1 tablet by mouth 2 (two) times daily.    . Cyanocobalamin (B-12 COMPLIANCE INJECTION) 1000 MCG/ML KIT Inject 1,000 mcg as directed every 30 (thirty) days.     Marland Kitchen lisinopril (ZESTRIL) 2.5 MG tablet Take 1 tablet (2.5 mg total) by mouth daily. 90 tablet 1  . metoprolol succinate (TOPROL XL) 25 MG 24 hr tablet Take 0.5 tablets (12.5 mg total) by mouth daily. 45 tablet 1  . Multiple Vitamin (MULTIVITAMIN WITH MINERALS) TABS tablet Take 1 tablet by mouth daily. 30 tablet 0  .  nitroGLYCERIN (NITROSTAT) 0.4 MG SL tablet Place 1 tablet (0.4 mg total) under the tongue every 5 (five) minutes as needed. 30 tablet 3  . rivaroxaban (XARELTO) 20 MG TABS tablet Take 1 tablet (20 mg total) by mouth daily with supper. 30 tablet 11  . rosuvastatin (CRESTOR) 10 MG tablet Take 1 tablet (10 mg total) by mouth daily. 30 tablet 3   No current facility-administered medications for this visit.   No Known Allergies   Review of Systems: All systems reviewed and negative except where noted in HPI.     Physical Exam:    Wt Readings from Last 3 Encounters:  12/30/19 201 lb 4 oz (91.3 kg)  12/07/19 199 lb 0.6 oz (90.3 kg)  11/25/19 196 lb (88.9 kg)    BP 136/82   Pulse 88   Temp 98 F (36.7 C)   Ht 5' 2" (1.575 m)   Wt 201 lb 4 oz (91.3 kg)   BMI 36.81 kg/m  Constitutional:  Pleasant, in no acute distress. Psychiatric: Normal mood and affect. Behavior is normal. EENT: Pupils normal.  Conjunctivae are normal. No scleral icterus. Neck supple. No cervical LAD. Cardiovascular: Normal rate, regular rhythm. No edema Pulmonary/chest: Effort normal and breath sounds normal. No wheezing, rales or rhonchi. Abdominal: Soft, nondistended, nontender. Bowel sounds active throughout. There are no masses palpable. No hepatomegaly. Neurological: Alert and oriented to person place and time. Skin: Skin is warm and dry. No rashes noted.   ASSESSMENT AND PLAN;   1) Iron deficiency anemia: 69 year old female presents with new IDA in the setting of starting Xarelto for unprovoked RLE DVT/PE in 06/2019.  Otherwise, no overt GI blood loss.  Interestingly, with normal iron indices on admission, with iron deficiency noted later in the month after starting systemic anticoagulation, which is suspicious for occult blood loss.  Plan as follows:  - EGD with duodenal biopsies -Colonoscopy -Discussed that if EGD/colonoscopy unrevealing, plan for VCE for small bowel interrogation -Repeat iron panel  as already ordered in the Hematology clinic.  If still deficient, may need IV iron  2) B12 deficiency: - Good response to B12 injections - Gastric bxs to eval for atrophic gastritis  3) History of PE 4) History of DVT 5) Systemic anticoagulation -Has now been on systemic anticoagulation for 6+ months, so can hopefully hold in the perioperative period -Hold Xarelto to days before procedure - will instruct when and how to resume after procedure. Low but real risk of cardiovascular event such as heart attack, stroke, embolism, thrombosis or ischemia/infarct of other organs off Xarelto explained and need to seek urgent help if this occurs. The patient consents to proceed. Will communicate by phone or EMR with patient's prescribing provider to  confirm that holding Xarelto is reasonable in this case  The indications, risks, and benefits of EGD and colonoscopy were explained to the patient in detail. Risks include but are not limited to bleeding, perforation, adverse reaction to medications, and cardiopulmonary compromise. Sequelae include but are not limited to the possibility of surgery, hositalization, and mortality. The patient verbalized understanding and wished to proceed. All questions answered, referred to scheduler and bowel prep ordered. Further recommendations pending results of the exam.     Vito V Cirigliano, DO, FACG  12/30/2019, 10:22 AM   Zhao, Yan, MD  

## 2019-12-30 NOTE — Telephone Encounter (Signed)
Okay to hold Xarelto per protocol.  Dr. Maylon Peppers

## 2019-12-30 NOTE — Telephone Encounter (Signed)
Called patient and left 2 voicemails about calling us back to see about picking up a prep since insurance will not cover the prep

## 2019-12-30 NOTE — Telephone Encounter (Signed)
Lmom for patient to call back 

## 2019-12-30 NOTE — Patient Instructions (Signed)
You have been scheduled for an endoscopy and colonoscopy. Please follow the written instructions given to you at your visit today. Please pick up your prep supplies at the pharmacy within the next 1-3 days. If you use inhalers (even only as needed), please bring them with you on the day of your procedure. Your physician has requested that you go to www.startemmi.com and enter the access code given to you at your visit today. This web site gives a general overview about your procedure. However, you should still follow specific instructions given to you by our office regarding your preparation for the procedure.  It was a pleasure to see you today!  Vito Cirigliano, D.O.

## 2020-01-01 ENCOUNTER — Telehealth: Payer: Self-pay | Admitting: Gastroenterology

## 2020-01-01 NOTE — Telephone Encounter (Signed)
Patient notified of Xarelto hold for 2 days prior to her procedure. She voiced understanding

## 2020-01-01 NOTE — Telephone Encounter (Signed)
Patient called states the prep medication is too expensive

## 2020-01-01 NOTE — Telephone Encounter (Signed)
Patient will come by the [facility] office to pick up Clinpiq sample

## 2020-01-01 NOTE — Telephone Encounter (Signed)
LMOM for patient to call back.

## 2020-01-08 NOTE — Telephone Encounter (Signed)
Lets have her repeat b12 level at her upcoming nurse visit and then we can decide. thanks

## 2020-01-08 NOTE — Addendum Note (Signed)
Addended by: Gerilyn Nestle on: 01/08/2020 01:51 PM   Modules accepted: Orders

## 2020-01-08 NOTE — Telephone Encounter (Signed)
Attempted to contact patient to make her aware of lab appt prior to nurse visit.Unable to leave message. Noted on appt note.

## 2020-01-08 NOTE — Telephone Encounter (Addendum)
Reviewing chart for upcoming nurse visit.  On 12/17/19, PCP advised to have B12 1040mcg IM weekly x 3 weeks, then monthly.  This schedule of injections were never completed, patient scheduled for injection on 01/13/20 (last 12/16/19).  Okay to start weekly injections with next weeks NV? Or are labs needed first?

## 2020-01-12 ENCOUNTER — Ambulatory Visit (AMBULATORY_SURGERY_CENTER): Payer: Medicare HMO | Admitting: Gastroenterology

## 2020-01-12 ENCOUNTER — Other Ambulatory Visit: Payer: Self-pay

## 2020-01-12 ENCOUNTER — Encounter: Payer: Self-pay | Admitting: Gastroenterology

## 2020-01-12 VITALS — BP 119/61 | HR 83 | Temp 97.5°F | Resp 20 | Ht 62.0 in | Wt 201.0 lb

## 2020-01-12 DIAGNOSIS — D509 Iron deficiency anemia, unspecified: Secondary | ICD-10-CM

## 2020-01-12 DIAGNOSIS — K6389 Other specified diseases of intestine: Secondary | ICD-10-CM | POA: Diagnosis not present

## 2020-01-12 DIAGNOSIS — K3189 Other diseases of stomach and duodenum: Secondary | ICD-10-CM | POA: Diagnosis not present

## 2020-01-12 DIAGNOSIS — K635 Polyp of colon: Secondary | ICD-10-CM | POA: Diagnosis not present

## 2020-01-12 DIAGNOSIS — K2970 Gastritis, unspecified, without bleeding: Secondary | ICD-10-CM | POA: Diagnosis not present

## 2020-01-12 DIAGNOSIS — D120 Benign neoplasm of cecum: Secondary | ICD-10-CM

## 2020-01-12 DIAGNOSIS — K317 Polyp of stomach and duodenum: Secondary | ICD-10-CM

## 2020-01-12 MED ORDER — SODIUM CHLORIDE 0.9 % IV SOLN: 500.0000 mL | Freq: Once | INTRAVENOUS | Status: DC

## 2020-01-12 NOTE — Progress Notes (Signed)
Called to room to assist during endoscopic procedure.  Patient ID and intended procedure confirmed with present staff. Received instructions for my participation in the procedure from the performing physician.  

## 2020-01-12 NOTE — Op Note (Signed)
[facility] Patient Name: Ruth Jensen Procedure Date: 01/12/2020 2:13 PM MRN: [id number] Endoscopist: Gerrit Heck , MD Age: 69 Referring MD:  Date of Birth: 07-28-1951 Gender: Female Account #: [id number] Procedure:                Upper GI endoscopy Indications:              Iron deficiency anemia                           History of unprovoked DVT/PE in 06/2019 Medicines:                Monitored Anesthesia Care Procedure:                Pre-Anesthesia Assessment:                           - Prior to the procedure, a History and Physical                            was performed, and patient medications and                            allergies were reviewed. The patient's tolerance of                            previous anesthesia was also reviewed. The risks                            and benefits of the procedure and the sedation                            options and risks were discussed with the patient.                            All questions were answered, and informed consent                            was obtained. Prior Anticoagulants: The patient has                            taken no previous anticoagulant or antiplatelet                            agents. ASA Grade Assessment: III - A patient with                            severe systemic disease. After reviewing the risks                            and benefits, the patient was deemed in                            satisfactory condition to undergo the procedure.  After obtaining informed consent, the endoscope was                            passed under direct vision. Throughout the                            procedure, the patient's blood pressure, pulse, and                            oxygen saturations were monitored continuously. The                            Endoscope was introduced through the mouth, and                            advanced to the second part of duodenum.  The upper                            GI endoscopy was accomplished without difficulty.                            The patient tolerated the procedure well. Scope In: Scope Out: Findings:                 The examined esophagus was normal.                           The gastroesophageal flap valve was visualized                            endoscopically and classified as Hill Grade III                            (minimal fold, loose to endoscope, hiatal hernia                            likely).                           Scattered moderate inflammation characterized by                            congestion (edema) and erythema was found in the                            gastric fundus, in the gastric body, in the gastric                            antrum and in the prepyloric region of the stomach.                            Biopsies were taken with a cold forceps for                            Helicobacter pylori  testing. Estimated blood loss                            was minimal.                           A single 4 mm sessile polyp with no bleeding and no                            stigmata of recent bleeding was found in the                            cardia. The polyp was removed with a cold biopsy                            forceps. Resection and retrieval were complete.                            Estimated blood loss was minimal.                           The duodenal bulb, first portion of the duodenum                            and second portion of the duodenum were normal.                            Biopsies for histology were taken with a cold                            forceps for evaluation of celiac disease. Estimated                            blood loss was minimal. Complications:            No immediate complications. Estimated Blood Loss:     Estimated blood loss was minimal. Impression:               - Normal esophagus.                           - Gastroesophageal flap  valve classified as Hill                            Grade III (minimal fold, loose to endoscope, hiatal                            hernia likely).                           - Gastritis. Biopsied.                           - A single gastric polyp. Resected and retrieved.                           -  Normal duodenal bulb, first portion of the                            duodenum and second portion of the duodenum.                            Biopsied. Recommendation:           - Patient has a contact number available for                            emergencies. The signs and symptoms of potential                            delayed complications were discussed with the                            patient. Return to normal activities tomorrow.                            Written discharge instructions were provided to the                            patient.                           - Resume previous diet.                           - Continue present medications.                           - Await pathology results.                           - Perform a colonoscopy today.                           - If biopsies and colonoscopy otherwise                            unrevealing, plan for VCE for small bowel                            interrogation. Gerrit Heck, MD 01/12/2020 3:03:40 PM

## 2020-01-12 NOTE — Op Note (Signed)
[facility] Patient Name: Ruth Jensen Procedure Date: 01/12/2020 2:12 PM MRN: [id number] Endoscopist: Gerrit Heck , MD Age: 69 Referring MD:  Date of Birth: [birth year] Gender: Female Account #: [id number] Procedure:                Colonoscopy Indications:              Iron deficiency anemia                           History of unprovoked DVT/PE in 06/2019                           Last colonoscopy was >10 years ago. Otherwise, no                            active GI symptoms. Medicines:                Monitored Anesthesia Care Procedure:                Pre-Anesthesia Assessment:                           - Prior to the procedure, a History and Physical                            was performed, and patient medications and                            allergies were reviewed. The patient's tolerance of                            previous anesthesia was also reviewed. The risks                            and benefits of the procedure and the sedation                            options and risks were discussed with the patient.                            All questions were answered, and informed consent                            was obtained. Prior Anticoagulants: The patient has                            taken no previous anticoagulant or antiplatelet                            agents. ASA Grade Assessment: III - A patient with                            severe systemic disease. After reviewing the risks  and benefits, the patient was deemed in                            satisfactory condition to undergo the procedure.                           After obtaining informed consent, the colonoscope                            was passed under direct vision. Throughout the                            procedure, the patient's blood pressure, pulse, and                            oxygen saturations were monitored continuously. The                             Colonoscope was introduced through the anus and                            advanced to the the cecum, identified by                            appendiceal orifice and ileocecal valve. The                            colonoscopy was performed without difficulty. The                            patient tolerated the procedure well. The quality                            of the bowel preparation was adequate. The                            ileocecal valve, appendiceal orifice, and rectum                            were photographed. Scope In: 2:42:13 PM Scope Out: 2:57:50 PM Scope Withdrawal Time: 0 hours 12 minutes 25 seconds  Total Procedure Duration: 0 hours 15 minutes 37 seconds  Findings:                 Hemorrhoids were found on perianal exam.                           A 2 mm polyp was found in the cecum. The polyp was                            sessile. The polyp was removed with a cold biopsy                            forceps. Resection and retrieval were complete.  Estimated blood loss was minimal.                           The ascending colon revealed moderately excessive                            looping.                           A diffuse area of melanosis along with inflammatory                            changes (edema, decreased vascularity; no ulcers,                            erosions, aphthae) was found in the rectum, in the                            recto-sigmoid colon, in the sigmoid colon, in the                            descending colon and in the distal transverse                            colon. This was segmental, with normal mucosa in                            between these areas. Several mucosal biopsies were                            taken with a cold forceps for histology. Estimated                            blood loss was minimal. The colon was otherwise                            normal appearing, with no areas of bleeding or                             stigmata of recent bleed.                           Non-bleeding internal hemorrhoids were found during                            retroflexion. The hemorrhoids were small. Complications:            No immediate complications. Estimated Blood Loss:     Estimated blood loss was minimal. Impression:               - Hemorrhoids found on perianal exam.                           - One 2 mm polyp in the cecum, removed with a cold  biopsy forceps. Resected and retrieved.                           - There was significant looping of the colon.                           - Melanosis and inflammatory changes (edema,                            decreased vascularity) was noted in a segmental                            fashion in the distal transverse colon through the                            rectum. Biopsied.                           - Non-bleeding internal hemorrhoids. Recommendation:           - Patient has a contact number available for                            emergencies. The signs and symptoms of potential                            delayed complications were discussed with the                            patient. Return to normal activities tomorrow.                            Written discharge instructions were provided to the                            patient.                           - Resume previous diet.                           - Continue present medications.                           - Await pathology results.                           - Repeat colonoscopy in 7-10 years for surveillance                            based on pathology results.                           - Resume Xarelto (rivaroxaban) at prior dose                            tomorrow.                           -  Return to GI clinic at appointment to be                            scheduled. Gerrit Heck, MD 01/12/2020 3:11:12 PM

## 2020-01-12 NOTE — Progress Notes (Signed)
Pt's states no medical or surgical changes since previsit or office visit. 

## 2020-01-12 NOTE — Progress Notes (Signed)
To PACU, VSS. Report to Rn.tb 

## 2020-01-12 NOTE — Patient Instructions (Signed)
HANDOUTS PROVIDED ON: GASTRITIS, POLYPS, DIVERTICULOSIS, & HEMORRHOIDS  The polyp removed/biopsies taken today have been sent for pathology.  The results can take 1-3 weeks to receive.  When your next colonoscopy should occur will be based on the pathology results.    You may resume your previous diet and medication schedule.  [place], Wednesday MAY 19.  Thank you for allowing [place] to care for you today!!!   YOU HAD AN ENDOSCOPIC PROCEDURE TODAY AT [facility]:   Refer to the procedure report that was given to you for any specific questions about what was found during the examination.  If the procedure report does not answer your questions, please call your gastroenterologist to clarify.  If you requested that your care partner not be given the details of your procedure findings, then the procedure report has been included in a sealed envelope for you to review at your convenience later.  YOU SHOULD EXPECT: Some feelings of bloating in the abdomen. Passage of more gas than usual.  Walking can help get rid of the air that was put into your GI tract during the procedure and reduce the bloating. If you had a lower endoscopy (such as a colonoscopy or flexible sigmoidoscopy) you may notice spotting of blood in your stool or on the toilet paper. If you underwent a bowel prep for your procedure, you may not have a normal bowel movement for a few days.  Please Note:  You might notice some irritation and congestion in your nose or some drainage.  This is from the oxygen used during your procedure.  There is no need for concern and it should clear up in a day or so.  SYMPTOMS TO REPORT IMMEDIATELY:   Following lower endoscopy (colonoscopy or flexible sigmoidoscopy):  Excessive amounts of blood in the stool  Significant tenderness or worsening of abdominal pains  Swelling of the abdomen that is new, acute  Fever of 100F or higher   Following upper endoscopy  (EGD)  Vomiting of blood or coffee ground material  New chest pain or pain under the shoulder blades  Painful or persistently difficult swallowing  New shortness of breath  Fever of 100F or higher  Black, tarry-looking stools  For urgent or emergent issues, a gastroenterologist can be reached at any hour by calling [phone]. Do not use MyChart messaging for urgent concerns.    DIET:  We do recommend a small meal at first, but then you may proceed to your regular diet.  Drink plenty of fluids but you should avoid alcoholic beverages for 24 hours.  ACTIVITY:  You should plan to take it easy for the rest of today and you should NOT DRIVE or use heavy machinery until tomorrow (because of the sedation medicines used during the test).    FOLLOW UP: Our staff will call the number listed on your records 48-72 hours following your procedure to check on you and address any questions or concerns that you may have regarding the information given to you following your procedure. If we do not reach you, we will leave a message.  We will attempt to reach you two times.  During this call, we will ask if you have developed any symptoms of COVID 19. If you develop any symptoms (ie: fever, flu-like symptoms, shortness of breath, cough etc.) before then, please call [phone].  If you test positive for Covid 19 in the 2 weeks post procedure, please call and report this information to  [place].    If any biopsies were taken you will be contacted by phone or by letter within the next 1-3 weeks.  Please call us at [phone] if you have not heard about the biopsies in 3 weeks.    SIGNATURES/CONFIDENTIALITY: You and/or your care partner have signed paperwork which will be entered into your electronic medical record.  These signatures attest to the fact that that the information above on your After Visit Summary has been reviewed and is understood.  Full responsibility of the confidentiality of this discharge  information lies with you and/or your care-partner.

## 2020-01-13 ENCOUNTER — Other Ambulatory Visit (INDEPENDENT_AMBULATORY_CARE_PROVIDER_SITE_OTHER): Payer: Medicare HMO

## 2020-01-13 ENCOUNTER — Ambulatory Visit (INDEPENDENT_AMBULATORY_CARE_PROVIDER_SITE_OTHER): Payer: Medicare HMO | Admitting: *Deleted

## 2020-01-13 DIAGNOSIS — E538 Deficiency of other specified B group vitamins: Secondary | ICD-10-CM | POA: Diagnosis not present

## 2020-01-13 LAB — VITAMIN B12: Vitamin B-12: 243 pg/mL (ref 211–911)

## 2020-01-13 MED ORDER — CYANOCOBALAMIN 1000 MCG/ML IJ SOLN
1000.0000 ug | Freq: Once | INTRAMUSCULAR | Status: AC
  Administered 2020-01-13: 1000 ug via INTRAMUSCULAR

## 2020-01-13 NOTE — Progress Notes (Signed)
Patient here for B12 injection per [name].  B12 injection given in left deltoid and patient tolerated well.  Injection already scheduled for next month.

## 2020-01-14 ENCOUNTER — Telehealth: Payer: Self-pay

## 2020-01-14 ENCOUNTER — Encounter: Payer: Self-pay | Admitting: Family

## 2020-01-14 NOTE — Telephone Encounter (Signed)
  Follow up Call-  Call back number 01/12/2020  Post procedure Call Back phone  # [phone]  Permission to leave phone message Yes  Some recent data might be hidden     Patient questions:  Do you have a fever, pain , or abdominal swelling? No. Pain Score  0 *  Have you tolerated food without any problems? Yes.    Have you been able to return to your normal activities? Yes.    Do you have any questions about your discharge instructions: Diet   No. Medications  No. Follow up visit  No.  Do you have questions or concerns about your Care? No.  Actions: * If pain score is 4 or above: No action needed, pain <4.  1. Have you developed a fever since your procedure? no  2.   Have you had an respiratory symptoms (SOB or cough) since your procedure? no  3.   Have you tested positive for COVID 19 since your procedure no  4.   Have you had any family members/close contacts diagnosed with the COVID 19 since your procedure?  no   If yes to any of these questions please route to Joylene John, RN and Erenest Rasher, RN

## 2020-01-20 ENCOUNTER — Encounter: Payer: Self-pay | Admitting: Cardiology

## 2020-01-27 ENCOUNTER — Encounter: Payer: Self-pay | Admitting: Gastroenterology

## 2020-02-03 ENCOUNTER — Telehealth: Payer: Self-pay

## 2020-02-03 ENCOUNTER — Other Ambulatory Visit: Payer: Self-pay

## 2020-02-03 MED ORDER — ALENDRONATE SODIUM 70 MG PO TABS: 70.0000 mg | ORAL_TABLET | ORAL | 11 refills | Status: DC

## 2020-02-03 NOTE — Telephone Encounter (Signed)
Medication refilled

## 2020-02-03 NOTE — Telephone Encounter (Signed)
Patient called in to get a prescription refill for alendronate (FOSAMAX) 70 MG tablet [[id number]]    Please send it to [id number] - [place], [place] - [address] AT [address]  [address], [place] [address]  Phone:  [phone] Fax:  [phone]  DEA #:  [id number]

## 2020-02-17 ENCOUNTER — Other Ambulatory Visit: Payer: Self-pay

## 2020-02-17 ENCOUNTER — Ambulatory Visit (INDEPENDENT_AMBULATORY_CARE_PROVIDER_SITE_OTHER): Payer: Medicare HMO

## 2020-02-17 DIAGNOSIS — E538 Deficiency of other specified B group vitamins: Secondary | ICD-10-CM | POA: Diagnosis not present

## 2020-02-17 MED ORDER — CYANOCOBALAMIN 1000 MCG/ML IJ SOLN
1000.0000 ug | INTRAMUSCULAR | Status: DC
  Administered 2020-02-17: 1000 ug via INTRAMUSCULAR

## 2020-02-17 NOTE — Progress Notes (Signed)
Patient here for B12 injection per [name].  B12 injection given in left deltoid and patient tolerated well.  Injection scheduled for next month.

## 2020-02-25 ENCOUNTER — Ambulatory Visit: Payer: Medicare HMO | Admitting: Cardiology

## 2020-03-08 ENCOUNTER — Other Ambulatory Visit: Payer: Self-pay | Admitting: Family

## 2020-03-15 ENCOUNTER — Other Ambulatory Visit: Payer: Self-pay

## 2020-03-15 ENCOUNTER — Encounter: Payer: Self-pay | Admitting: Gastroenterology

## 2020-03-15 ENCOUNTER — Telehealth: Payer: Self-pay

## 2020-03-15 ENCOUNTER — Ambulatory Visit (INDEPENDENT_AMBULATORY_CARE_PROVIDER_SITE_OTHER): Payer: Medicare HMO

## 2020-03-15 ENCOUNTER — Ambulatory Visit: Payer: Medicare HMO | Admitting: Gastroenterology

## 2020-03-15 VITALS — BP 120/76 | HR 79 | Ht 62.0 in | Wt 199.4 lb

## 2020-03-15 DIAGNOSIS — E538 Deficiency of other specified B group vitamins: Secondary | ICD-10-CM

## 2020-03-15 DIAGNOSIS — K3189 Other diseases of stomach and duodenum: Secondary | ICD-10-CM

## 2020-03-15 DIAGNOSIS — D509 Iron deficiency anemia, unspecified: Secondary | ICD-10-CM

## 2020-03-15 DIAGNOSIS — K31A0 Gastric intestinal metaplasia, unspecified: Secondary | ICD-10-CM

## 2020-03-15 DIAGNOSIS — K2970 Gastritis, unspecified, without bleeding: Secondary | ICD-10-CM | POA: Diagnosis not present

## 2020-03-15 DIAGNOSIS — K2990 Gastroduodenitis, unspecified, without bleeding: Secondary | ICD-10-CM

## 2020-03-15 MED ORDER — METOPROLOL SUCCINATE ER 25 MG PO TB24: 12.5000 mg | ORAL_TABLET | Freq: Every day | ORAL | 1 refills | Status: DC

## 2020-03-15 MED ORDER — CYANOCOBALAMIN 1000 MCG/ML IJ SOLN
1000.0000 ug | Freq: Once | INTRAMUSCULAR | Status: AC
  Administered 2020-03-15: 1000 ug via INTRAMUSCULAR

## 2020-03-15 MED ORDER — PANTOPRAZOLE SODIUM 20 MG PO TBEC
20.0000 mg | DELAYED_RELEASE_TABLET | ORAL | 1 refills | Status: DC
Start: 2020-03-15 — End: 2021-04-14

## 2020-03-15 MED ORDER — LISINOPRIL 2.5 MG PO TABS: 2.5000 mg | ORAL_TABLET | Freq: Every day | ORAL | 1 refills | Status: DC

## 2020-03-15 NOTE — Progress Notes (Signed)
P  Chief Complaint:    Procedure follow-up, iron deficiency anemia  GI History: Ruth Jensen is a 69 y.o. 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    02/2018: -H/H 12/37.4 and MCV/RDW 84.8/16.6 (only lab for comparison)  07/01/2019 (admission for DVT/PE): -H/H 11/34, MCV/RDW 100.3/14.6 -Ferritin 62, iron 63, TIBC 369, sat 17% -B12 <50, normal folate -K3.2, otherwise normal BMP -Albumin 3.3, otherwise normal liver enzymes  07/21/2019: -H/H 11.1/34, MCV/RDW 97/15 -Ferritin 9, iron 41, TIBC 360, sat 11% -B12>7500  12/07/2019: -H/H 11.8/37.7, MCV/RDW 78.2/15.3 -B12 260 -Iron panel ordered; not yet done -Normal CMP  01/13/2020: -B12 243  Endoscopic history: -EGD (01/12/2020, Dr. Bryan Lemma): Hill Grade 3, moderate non-H. pylori gastritis with focal intestinal metaplasia, benign gastric polyp with intestinal metaplasia, normal duodenum (path: Slight increase IELs without villous flattening) -Colonoscopy (01/12/2020, Dr. Bryan Lemma): 2 mm benign cecal polyp, melanosis coli, segmental nonspecific inflammation, internal hemorrhoids.  No repeat for screening due to age  HPI:     Patient is a 69 y.o. female presenting to the Gastroenterology Clinic for follow-up.  EGD/colonoscopy completed 12/2019 for evaluation of IDA, with results as above.  She reports a "tingling in my throat" since the EGD, which tends to occur at nighttime and resolves with a peppermint.  Symptoms minimally bothersome.  Otherwise tolerating p.o. intake without issue.  She is otherwise in her usual state of health today.  No complaints.  Received B12 injection earlier today.  Repeat in 1 month.  Follows with Dr. Harriet Masson  next week and in [facility] in 05/2020  Review of systems:     No chest pain, no SOB, no fevers, no urinary sx   Past Medical History:  Diagnosis Date  . Abnormal echocardiogram 07/22/2019  . Abnormal EKG 07/22/2019  . Acute deep vein thrombosis (DVT) of popliteal vein of right lower extremity ([place]) 07/01/2019  . Acute pulmonary embolism ([place]) 07/02/2019  . Chest pain 07/22/2019  . Depressed left ventricular ejection fraction 07/22/2019  . DVT (deep venous thrombosis) ([place]) 07/01/2019  . Hypertension   . Lipid screening 07/22/2019  . Obesity (BMI 30-39.9) 07/22/2019  . Osteoporosis 11/18/2019  . Pulmonary embolism ([place]) 07/02/2019  . Single subsegmental pulmonary embolism without acute cor pulmonale ([place]) 07/01/2019    Patient's surgical history, family medical history, social history, medications and allergies were all reviewed in Epic    Current Outpatient Medications  Medication Sig Dispense Refill  . alendronate (FOSAMAX) 70 MG tablet Take 1 tablet (70 mg total) by mouth every 7 (seven) days. Take with a full glass of water on an empty stomach. 4 tablet 11  . Calcium Carbonate-Vitamin D 600-400 MG-UNIT chew tablet Chew 1 tablet by mouth 2 (two) times daily.    . Cyanocobalamin (B-12 COMPLIANCE INJECTION) 1000 MCG/ML KIT Inject 1,000 mcg as directed every 30 (thirty) days.     Marland Kitchen lisinopril (ZESTRIL) 2.5 MG tablet Take 1 tablet (2.5 mg total) by mouth daily. 90 tablet 1  . metoprolol succinate (TOPROL XL) 25 MG 24 hr tablet Take 0.5 tablets (12.5 mg total) by mouth daily. 45 tablet 1  . Multiple Vitamin (MULTIVITAMIN WITH MINERALS) TABS tablet Take 1 tablet by mouth daily. 30 tablet 0  . rivaroxaban (XARELTO) 20 MG TABS  tablet Take 1 tablet (20 mg total) by mouth daily with supper. 30 tablet 11  . rosuvastatin (CRESTOR) 10 MG tablet TAKE 1 TABLET(10 MG) BY MOUTH DAILY 30 tablet 3  . nitroGLYCERIN (NITROSTAT) 0.4 MG SL tablet Place 1 tablet (0.4 mg total) under the tongue every 5  (five) minutes as needed. 30 tablet 3   Current Facility-Administered Medications  Medication Dose Route Frequency Provider Last Rate Last Admin  . cyanocobalamin ((VITAMIN B-12)) injection 1,000 mcg  1,000 mcg Intramuscular Q30 days [name], NP   1,000 mcg at 02/17/20 0932    Physical Exam:     BP 120/76   Pulse 79   Ht 5' 2"  (1.575 m)   Wt 199 lb 6 oz (90.4 kg)   BMI 36.47 kg/m   GENERAL:  Pleasant female in NAD PSYCH: : Cooperative, normal affect EENT:  conjunctiva pink, mucous membranes moist Musculoskeletal:  Normal muscle tone, normal strength NEURO: Alert and oriented x 3, no focal neurologic deficits   IMPRESSION and PLAN:    1) Iron Deficiency Anemia: -Colonoscopy unrevealing for etiology.  EGD with moderate non-H. pylori gastritis along with normal-appearing duodenum, but duodenal biopsies with increased intraepithelial lymphocytes without villous flattening.  Discussed this finding at length today, particularly as it relates to her IDA.  She is otherwise without symptoms suspicious for Celiac Disease.  Plan as follows:  -Check TTG IgA and total IgA -Video capsule endoscopy for small bowel interrogation -Repeat iron panel and CBC as scheduled by Hematology Clinic -Check Vit D for co-malabsorption  2) B12 deficiency: -Moderate gastritis noted, but no e/o atrophic/autoimmune -Needs to be taking PPI for treatment of gastritis which could serve as etiology.  Protonix 20 mg p.o. twice daily x8 weeks, then can reduce to daily -Depending on clinical response, consider sending intrinsic factor antibody -Does not have clinical symptoms suspicious for SIBO, pancreatic insufficiency  3) Intestinal metaplasia: 4) Gastritis -Discussed with histologic findings at length today.  Otherwise without elevated risk factors for gastric cancer.  Discussed repeat EGD with GIM mapping, which she is inclined to defer for 2-3 years, which seems reasonable -Consider repeat EGD  in 2-3 years with GIM mapping  RTC in 3-6 months or sooner as needed  The indications, risks, and benefits of Video Capsule Endoscopy were explained to the patient in detail. Risks include but are not limited to capsule retention requiring endoscopic or surgical retrieval (occurs in 1-2%), failure to reach cecum prior to capsule battery expiration (ie, incomplete study), or inadequate visualization (ie, non-diagnostic study). The patient verbalized understanding and wished to proceed. All questions answered, referred to scheduler. Further recommendations pending results of the exam.          Dominic Pea Herchel Hopkin ,DO, FACG 03/15/2020, 11:25 AM

## 2020-03-15 NOTE — Progress Notes (Signed)
Reviewed.   Nance Pear NP

## 2020-03-15 NOTE — Telephone Encounter (Signed)
Left message on patients voicemail regarding Protonix 20 mg as directed sent to her pharmacy to pick up. Patient is to call me back if she has further questions

## 2020-03-15 NOTE — Telephone Encounter (Signed)
-----   Message from [place], DO sent at 03/15/2020 12:56 PM EDT ----- I forgot to counsel the patient that she should be on a PPI.  Can you please call and let her know that we are going to start her on Protonix 20 mg p.o. twice daily x8 weeks for treatment of her gastritis, then can reduce to 20 mg daily.  Thank you.

## 2020-03-15 NOTE — Progress Notes (Signed)
Pt here for monthly B12 injection per Melissa O'Sullivan   B12 1000mcg given IM in left deltoid, and pt tolerated injection well.  Next B12 injection scheduled for next month.  

## 2020-03-15 NOTE — Patient Instructions (Addendum)
If you are age 70 or older, your body mass index should be between 23-30. Your Body mass index is 36.47 kg/m. If this is out of the aforementioned range listed, please consider follow up with your Primary Care Provider.  If you are age 70 or younger, your body mass index should be between 19-25. Your Body mass index is 36.47 kg/m. If this is out of the aformentioned range listed, please consider follow up with your Primary Care Provider.   Your provider has requested that you go to the basement level for lab work before leaving today . Press "B" on the elevator. The lab is located at the first door on the left as you exit the elevator.   [facility] CAPSULE ENDOSCOPY PATIENT INSTRUCTION SHEET  Ruth Jensen 26-May-1971 [id number]   1. 03/30/20 Seven (7) days prior to capsule endoscopy stop taking iron supplements and carafate.  2. 04/04/20 Two (2) days prior to capsule endoscopy stop taking aspirin or any arthritis drugs.  3. 04/05/20 Day before capsule endoscopy purchase a 238 gram bottle of Miralax from the laxative section of your drug store, and a 32 oz. bottle of Gatorade (no red).    4. 04/05/20 One (1) day prior to capsule endoscopy: a) Stop smoking. b) Eat a regular diet until 12:00 Noon. c) After 12:00 Noon take only the following: Black coffee  Jell-O (no fruit or red Jell-o) Water   Bouillon (chicken or beef) 7-Up   Cranberry Juice Tea   Kool-Aid Popsicle (not red) Sprite   Coke Ginger Ale  Pepsi Mountain Dew Gatorade d) At 6:00 pm the evening before your appointment, drink 7 capfuls (105 grams) of Miralax with 32 oz. Gatorade. Drink 8 oz every 15 minutes until gone. e) Nothing to eat or drink after midnight except medications with a sip of water.  5. 04/06/20 Day of capsule endoscopy:            Do not have anything to eat or drink after midnight No medications for 2 hours prior to your test.  6. Please arrive at [facility]  3rd floor patient registration area by 8:30  am on: 04/06/20.   For any questions: Call [place] at [phone] and ask to speak with one of the capsule endoscopy nurses.      The above instructions have been reviewed and explained to me by________________   Patient signature:_________________________________________     Date:________________        What you should know.   You have been scheduled for a Capsule Endoscopy with Capsocam.  You will swallow a vitamin size pill that contains cameras that will take pictures of your small intestine (bowel).  Your small bowel connects to your stomach on one end and the large intestine or bowel on the other. The capsule moves through the gastrointestinal tract taking pictures along the way. The images are stored on the capsule.  1-5 days after the capsule ingestion you will retrieve the capsule and mail it in a prepaid envelope to [employer] to download the images.  Your physician will be provided a copy of the images to review.   Who should have a capsule endoscopy?  You may need a small bowel capsule endoscopy if you have symptoms, such as blood in your stool, chronic pain, diarrhea, or unexplained anemia. The small intestine is a hollow organ that cannot be easily visualized with a scope due to its length.  The capsule endoscopy allows your physician to directly visualize the intestine.  The pictures may show intestine growths, inflammation, or bleeding in the small intestine.   What are the risks with a capsule endoscopy?  The pictures may not be clear or give [place] a clear cause of your symptoms The capsule may become trapped in the esophagus or intestines.  You may need surgery or endoscopic procedures to remove the capsule. You may not have a MRI until the capsule endoscopy has been retrieved.   How do I retrieve the capsule?  You will be provided a kit with step-by-step instructions for capsule retrieval and mailing.  You should expect to retrieve the capsule in 1-5 days  after ingestion.  This will depend on your individual bowel habits.     Follow up with me in 3-6 months.  Please call the office for an appointment as the schedule is not available at this time.  It was a pleasure to see you today!  Vito Cirigliano, D.O.

## 2020-03-16 ENCOUNTER — Other Ambulatory Visit: Payer: Self-pay | Admitting: Cardiology

## 2020-03-18 ENCOUNTER — Encounter: Payer: Self-pay | Admitting: Cardiology

## 2020-03-18 ENCOUNTER — Other Ambulatory Visit: Payer: Self-pay

## 2020-03-18 ENCOUNTER — Ambulatory Visit: Payer: Medicare HMO | Admitting: Cardiology

## 2020-03-18 VITALS — BP 124/74 | HR 91 | Ht 62.0 in | Wt 200.0 lb

## 2020-03-18 DIAGNOSIS — R0989 Other specified symptoms and signs involving the circulatory and respiratory systems: Secondary | ICD-10-CM | POA: Diagnosis not present

## 2020-03-18 DIAGNOSIS — I10 Essential (primary) hypertension: Secondary | ICD-10-CM | POA: Diagnosis not present

## 2020-03-18 DIAGNOSIS — E782 Mixed hyperlipidemia: Secondary | ICD-10-CM | POA: Diagnosis not present

## 2020-03-18 NOTE — Progress Notes (Addendum)
Cardiology Office Note:    Date:  03/18/2020   ID:  Ruth Jensen, DOB 1950-09-14, MRN [id number]  PCP:  [name], NP  Cardiologist:  Berniece Salines, DO  Electrophysiologist:  None   Referring MD: [name], NP   I am doing fine  History of Present Illness:    Ruth Jensen a 69 y.o.femalewith pulmonary embolismand DVTon Xarelto, depressed left ventricular ejection fraction, EF 40 to 45%.  Initially presented on August 21, 2019 to be evaluated for chest pain. During her visit after review of her medical record her echo did show evidence of inferior hypokinesis and with a depressed ejection fraction patient recommended for cardiac catheterization. She underwent cardiac catheterization on August 03, 2019 which showed normal coronaries.  At this see the patient on November 25, 2019 at that time we repeated her echocardiogram.  She was able to get her echocardiogram which showed improved ejection fraction from 40 to 45% and was now 55 to 60%.  She is here today for follow-up visit she offers no complaints.  Past Medical History:  Diagnosis Date  . Abnormal echocardiogram 07/22/2019  . Abnormal EKG 07/22/2019  . Acute deep vein thrombosis (DVT) of popliteal vein of right lower extremity ([place]) 07/01/2019  . Acute pulmonary embolism ([place]) 07/02/2019  . Chest pain 07/22/2019  . Depressed left ventricular ejection fraction 07/22/2019  . DVT (deep venous thrombosis) ([place]) 07/01/2019  . Hypertension   . Lipid screening 07/22/2019  . Obesity (BMI 30-39.9) 07/22/2019  . Osteoporosis 11/18/2019  . Pulmonary embolism ([place]) 07/02/2019  . Single subsegmental pulmonary embolism without acute cor pulmonale ([place]) 07/01/2019    Past Surgical History:  Procedure Laterality Date  . ABDOMINAL HYSTERECTOMY  2010   due to fibroids  . ABDOMINAL HYSTERECTOMY    . BREAST BIOPSY Left   . BREAST CYST EXCISION Left   . [place] [facility]. Late 2000's   . LEFT HEART  CATH AND CORONARY ANGIOGRAPHY N/A 08/03/2019   Procedure: LEFT HEART CATH AND CORONARY ANGIOGRAPHY;  Surgeon: Lorretta Harp, MD;  Location: [facility];  Service: Cardiovascular;  Laterality: N/A;  . OOPHORECTOMY      Current Medications: Current Meds  Medication Sig  . alendronate (FOSAMAX) 70 MG tablet Take 1 tablet (70 mg total) by mouth every 7 (seven) days. Take with a full glass of water on an empty stomach.  . Calcium Carbonate-Vitamin D 600-400 MG-UNIT chew tablet Chew 1 tablet by mouth 2 (two) times daily.  . Cyanocobalamin (B-12 COMPLIANCE INJECTION) 1000 MCG/ML KIT Inject 1,000 mcg as directed every 30 (thirty) days.   Marland Kitchen lisinopril (ZESTRIL) 2.5 MG tablet Take 1 tablet (2.5 mg total) by mouth daily.  . metoprolol succinate (TOPROL XL) 25 MG 24 hr tablet Take 0.5 tablets (12.5 mg total) by mouth daily.  . Multiple Vitamin (MULTIVITAMIN WITH MINERALS) TABS tablet Take 1 tablet by mouth daily.  . nitroGLYCERIN (NITROSTAT) 0.4 MG SL tablet Place 1 tablet (0.4 mg total) under the tongue every 5 (five) minutes as needed.  . pantoprazole (PROTONIX) 20 MG tablet Take 1 tablet (20 mg total) by mouth as directed. Take 20 mg twice daily for 8 weeks, then once daily  . rivaroxaban (XARELTO) 20 MG TABS tablet Take 1 tablet (20 mg total) by mouth daily with supper.  . rosuvastatin (CRESTOR) 10 MG tablet TAKE 1 TABLET(10 MG) BY MOUTH DAILY   Current Facility-Administered Medications for the 03/18/20 encounter (Office Visit) with Herby Amick,  Godfrey Pick, DO  Medication  . cyanocobalamin ((VITAMIN B-12)) injection 1,000 mcg     Allergies:   Patient has no known allergies.   Social History   Socioeconomic History  . Marital status: Married    Spouse name: Ruth Jensen  . Number of children: 2  . Years of education: Not on file  . Highest education level: Not on file  Occupational History  . Occupation: retired   Tobacco Use  . Smoking status: Never Smoker  . Smokeless tobacco: Never Used  Vaping  Use  . Vaping Use: Never used  Substance and Sexual Activity  . Alcohol use: Not Currently  . Drug use: Never  . Sexual activity: Not Currently  Other Topics Concern  . Not on file  Social History Narrative  . Not on file   Social Determinants of Health   Financial Resource Strain: Low Risk   . Difficulty of Paying Living Expenses: Not hard at all  Food Insecurity: No Food Insecurity  . Worried About Charity fundraiser in the Last Year: Never true  . Ran Out of Food in the Last Year: Never true  Transportation Needs: No Transportation Needs  . Lack of Transportation (Medical): No  . Lack of Transportation (Non-Medical): No  Physical Activity: Unknown  . Days of Exercise per Week: 0 days  . Minutes of Exercise per Session: Not on file  Stress:   . Feeling of Stress :   Social Connections: Moderately Isolated  . Frequency of Communication with Friends and Family: More than three times a week  . Frequency of Social Gatherings with Friends and Family: Never  . Attends Religious Services: Never  . Active Member of Clubs or Organizations: No  . Attends Archivist Meetings: Never  . Marital Status: Married     Family History: The patient's family history includes Asthma in her paternal grandfather; CAD in her sister; CVA in her mother; Dementia in her father; Hypertension in her daughter, father, mother, sister, sister, sister, and son; Parkinson's disease in her sister; Prostate cancer in her father; Skin cancer in her sister. There is no history of Colon cancer or Esophageal cancer.  ROS:   Review of Systems  Constitution: Negative for decreased appetite, fever and weight gain.  HENT: Negative for congestion, ear discharge, hoarse voice and sore throat.   Eyes: Negative for discharge, redness, vision loss in right eye and visual halos.  Cardiovascular: Negative for chest pain, dyspnea on exertion, leg swelling, orthopnea and palpitations.  Respiratory: Negative for  cough, hemoptysis, shortness of breath and snoring.   Endocrine: Negative for heat intolerance and polyphagia.  Hematologic/Lymphatic: Negative for bleeding problem. Does not bruise/bleed easily.  Skin: Negative for flushing, nail changes, rash and suspicious lesions.  Musculoskeletal: Negative for arthritis, joint pain, muscle cramps, myalgias, neck pain and stiffness.  Gastrointestinal: Negative for abdominal pain, bowel incontinence, diarrhea and excessive appetite.  Genitourinary: Negative for decreased libido, genital sores and incomplete emptying.  Neurological: Negative for brief paralysis, focal weakness, headaches and loss of balance.  Psychiatric/Behavioral: Negative for altered mental status, depression and suicidal ideas.  Allergic/Immunologic: Negative for HIV exposure and persistent infections.    EKGs/Labs/Other Studies Reviewed:    The following studies were reviewed today:   EKG: Sinus rhythm, heart rate 91 bpm, LVH by aVL criteria.  Echo IMPRESSIONS  1. Left ventricular ejection fraction, by estimation, is 55 to 60%. The left ventricle has normal function. The left ventricle has no regional wall motion abnormalities. Left  ventricular diastolic parameters are consistent with Grade I diastolic  dysfunction (impaired relaxation).  2. Right ventricular systolic function is normal. The right ventricular size is normal. There is mildly elevated pulmonary artery systolic pressure.  3. The mitral valve is normal in structure. No evidence of mitral valve regurgitation. No evidence of mitral stenosis.  4. The aortic valve is normal in structure. Aortic valve regurgitation is not visualized. No aortic stenosis is present.  5. The inferior vena cava is normal in size with greater than 50% respiratory variability, suggesting right atrial pressure of 3 mmHg.   Recent Labs: 07/01/2019: B Natriuretic Peptide 74.6 11/25/2019: Magnesium 2.2 12/07/2019: ALT 10; BUN 14; Creatinine 0.72;  Hemoglobin 11.8; Platelet Count 329; Potassium 3.5; Sodium 143  Recent Lipid Panel    Component Value Date/Time   CHOL 156 12/16/2019 0905   CHOL 234 (H) 07/22/2019 1418   TRIG 68.0 12/16/2019 0905   HDL 50.70 12/16/2019 0905   HDL 51 07/22/2019 1418   CHOLHDL 3 12/16/2019 0905   VLDL 13.6 12/16/2019 0905   LDLCALC 92 12/16/2019 0905   LDLCALC 168 (H) 07/22/2019 1418    Physical Exam:    VS:  BP 124/74   Pulse 91   Ht _0  (1.575 m)   Wt 200 lb (90.7 kg)   SpO2 99%   BMI 36.58 kg/m     Wt Readings from Last 3 Encounters:  03/18/20 200 lb (90.7 kg)  03/15/20 199 lb 6 oz (90.4 kg)  01/12/20 201 lb (91.2 kg)     GEN: Well nourished, well developed in no acute distress HEENT: Normal NECK: No JVD; No carotid bruits LYMPHATICS: No lymphadenopathy CARDIAC: S1S2 noted,RRR, no murmurs, rubs, gallops RESPIRATORY:  Clear to auscultation without rales, wheezing or rhonchi  ABDOMEN: Soft, non-tender, non-distended, +bowel sounds, no guarding. EXTREMITIES: No edema, No cyanosis, no clubbing MUSCULOSKELETAL:  No deformity  SKIN: Warm and dry NEUROLOGIC:  Alert and oriented x 3, non-focal PSYCHIATRIC:  Normal affect, good insight  ASSESSMENT:    1. Depressed left ventricular ejection fraction   2. Essential hypertension   3. Mixed hyperlipidemia    PLAN:     1.  Clinically from a cardiovascular standpoint does not have any complaints.  She looks good.  She shared with me that she just had her birthday back in April and had all of her family member stay with her which made her feel good.  At this time she will continue her current medication regimen which includes Toprol-XL 25 mg daily, lisinopril 2.5 mg daily.  Her blood pressure is acceptable.  2.  Dilated cardiomyopathy thankfully her EF has improved. 3.  Hyperlipidemia continue patient on Crestor 10 mg daily. 4.  She is on Xarelto for her pulmonary embolism/DVT.  Unclear if she is getting off this medication I think it  will be prefer the patient to see hematology oncology to reassess the need for this anticoagulation.  She is planning upper endoscopy/colonoscopy and Jensen need to be off of her Xarelto.  Which she can hold her Xarelto for 3 full days prior to her procedure.  And this can be restarted based on the discretion of her physician performing the procedure peer  The patient is in agreement with the above plan. The patient left the office in stable condition.  The patient will follow up in 6 months or sooner as needed.   Medication Adjustments/Labs and Tests Ordered: Current medicines are reviewed at length with the patient today.  Concerns regarding  medicines are outlined above.  Orders Placed This Encounter  Procedures  . EKG 12-Lead   No orders of the defined types were placed in this encounter.   Patient Instructions  Medication Instructions:  Your physician recommends that you continue on your current medications as directed. Please refer to the Current Medication list given to you today.  *If you need a refill on your cardiac medications before your next appointment, please call your pharmacy*   Lab Work: None If you have labs (blood work) drawn today and your tests are completely normal, you will receive your results only by: Marland Kitchen MyChart Message (if you have MyChart) OR . A paper copy in the mail If you have any lab test that is abnormal or we need to change your treatment, we will call you to review the results.   Testing/Procedures: None   Follow-Up: At [facility], you and your health needs are our priority.  As part of our continuing mission to provide you with exceptional heart care, we have created designated Provider Care Teams.  These Care Teams include your primary Cardiologist (physician) and Advanced Practice Providers (APPs -  Physician Assistants and Nurse Practitioners) who all work together to provide you with the care you need, when you need it.  We recommend signing  up for the patient portal called "MyChart".  Sign up information is provided on this After Visit Summary.  MyChart is used to connect with patients for Virtual Visits (Telemedicine).  Patients are able to view lab/test results, encounter notes, upcoming appointments, etc.  Non-urgent messages can be sent to your provider as well.   To learn more about what you can do with MyChart, go to [email or web address].    Your next appointment:   6 month(s)  The format for your next appointment:   In Person  Provider:   Berniece Salines, DO   Other Instructions      Adopting a Healthy Lifestyle.  Know what a healthy weight is for you (roughly BMI <25) and aim to maintain this   Aim for 7+ servings of fruits and vegetables daily   65-80+ fluid ounces of water or unsweet tea for healthy kidneys   Limit to max 1 drink of alcohol per day; avoid smoking/tobacco   Limit animal fats in diet for cholesterol and heart health - choose grass fed whenever available   Avoid highly processed foods, and foods high in saturated/trans fats   Aim for low stress - take time to unwind and care for your mental health   Aim for 150 min of moderate intensity exercise weekly for heart health, and weights twice weekly for bone health   Aim for 7-9 hours of sleep daily   When it comes to diets, agreement about the perfect plan isnt easy to find, even among the experts. Experts at the [employer] developed an idea known as the Healthy Eating Plate. Just imagine a plate divided into logical, healthy portions.   The emphasis is on diet quality:   Load up on vegetables and fruits - one-half of your plate: Aim for color and variety, and remember that potatoes dont count.   Go for whole grains - one-quarter of your plate: Whole wheat, barley, wheat berries, quinoa, oats, brown rice, and foods made with them. If you want pasta, go with whole wheat pasta.   Protein power - one-quarter of your  plate: Fish, chicken, beans, and nuts are all healthy, versatile protein sources. Limit red meat.  The diet, however, does go beyond the plate, offering a few other suggestions.   Use healthy plant oils, such as olive, canola, soy, corn, sunflower and peanut. Check the labels, and avoid partially hydrogenated oil, which have unhealthy trans fats.   If youre thirsty, drink water. Coffee and tea are good in moderation, but skip sugary drinks and limit milk and dairy products to one or two daily servings.   The type of carbohydrate in the diet is more important than the amount. Some sources of carbohydrates, such as vegetables, fruits, whole grains, and beans-are healthier than others.   Finally, stay active  Signed, Berniece Salines, DO  03/18/2020 10:27 AM    [facility]

## 2020-03-18 NOTE — Patient Instructions (Signed)

## 2020-04-06 ENCOUNTER — Other Ambulatory Visit (INDEPENDENT_AMBULATORY_CARE_PROVIDER_SITE_OTHER): Payer: Medicare HMO

## 2020-04-06 ENCOUNTER — Encounter: Payer: Self-pay | Admitting: Gastroenterology

## 2020-04-06 ENCOUNTER — Ambulatory Visit (INDEPENDENT_AMBULATORY_CARE_PROVIDER_SITE_OTHER): Payer: Medicare HMO | Admitting: Gastroenterology

## 2020-04-06 DIAGNOSIS — K5520 Angiodysplasia of colon without hemorrhage: Secondary | ICD-10-CM

## 2020-04-06 DIAGNOSIS — D509 Iron deficiency anemia, unspecified: Secondary | ICD-10-CM | POA: Diagnosis not present

## 2020-04-06 DIAGNOSIS — E538 Deficiency of other specified B group vitamins: Secondary | ICD-10-CM

## 2020-04-06 DIAGNOSIS — D500 Iron deficiency anemia secondary to blood loss (chronic): Secondary | ICD-10-CM

## 2020-04-06 LAB — IGA: IgA: 320 mg/dL (ref 68–378)

## 2020-04-06 LAB — VITAMIN D 25 HYDROXY (VIT D DEFICIENCY, FRACTURES): VITD: 12.02 ng/mL — ABNORMAL LOW (ref 30.00–100.00)

## 2020-04-06 NOTE — Patient Instructions (Signed)
Pt here for capsule endo, states she completed the prep without difficulty. Pt swallowed the capsule and tolerated procure well.  Lot #-[id number] Exp:  09/15/21  Pt instructed on capsule retrieval and how to send it back. Pt knows to call if she does not pass the capsule within 48-72 hours.

## 2020-04-07 LAB — TISSUE TRANSGLUTAMINASE, IGA: (tTG) Ab, IgA: 1 U/mL

## 2020-04-15 ENCOUNTER — Other Ambulatory Visit: Payer: Self-pay

## 2020-04-15 ENCOUNTER — Ambulatory Visit (INDEPENDENT_AMBULATORY_CARE_PROVIDER_SITE_OTHER): Payer: Medicare HMO

## 2020-04-15 ENCOUNTER — Other Ambulatory Visit: Payer: Self-pay | Admitting: Gastroenterology

## 2020-04-15 DIAGNOSIS — E538 Deficiency of other specified B group vitamins: Secondary | ICD-10-CM | POA: Diagnosis not present

## 2020-04-15 DIAGNOSIS — E559 Vitamin D deficiency, unspecified: Secondary | ICD-10-CM

## 2020-04-15 MED ORDER — VITAMIN D (ERGOCALCIFEROL) 1.25 MG (50000 UNIT) PO CAPS
50000.0000 [IU] | ORAL_CAPSULE | ORAL | 0 refills | Status: DC
Start: 2020-04-15 — End: 2021-04-17

## 2020-04-15 MED ORDER — CYANOCOBALAMIN 1000 MCG/ML IJ SOLN
1000.0000 ug | Freq: Once | INTRAMUSCULAR | Status: AC
  Administered 2020-04-15: 1000 ug via INTRAMUSCULAR

## 2020-04-15 NOTE — Progress Notes (Addendum)
Pt here for monthly B12 injection per  [name]  B12 1027mcg given IM left deltoid and pt tolerated injection well.  Next B12 injection scheduled for next month.   CMA note reviewed.  [name] NP

## 2020-05-04 NOTE — Progress Notes (Signed)
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 Otherwise normal study.  Recommendations: - Serial CBC checks - Continue B12 and iron repletion as already doing - RTC in 3 months - No role for enteroscopy given these findings

## 2020-05-13 ENCOUNTER — Other Ambulatory Visit: Payer: Self-pay

## 2020-05-17 ENCOUNTER — Ambulatory Visit: Payer: Medicare HMO

## 2020-05-17 ENCOUNTER — Ambulatory Visit: Payer: Medicare HMO | Admitting: Family

## 2020-05-20 ENCOUNTER — Ambulatory Visit: Payer: Medicare HMO | Admitting: Family

## 2020-05-27 ENCOUNTER — Other Ambulatory Visit: Payer: Self-pay

## 2020-05-27 ENCOUNTER — Ambulatory Visit (INDEPENDENT_AMBULATORY_CARE_PROVIDER_SITE_OTHER): Payer: Medicare HMO | Admitting: Family

## 2020-05-27 VITALS — BP 155/78 | HR 95 | Temp 98.6°F | Resp 16 | Ht 62.0 in | Wt 194.0 lb

## 2020-05-27 DIAGNOSIS — E538 Deficiency of other specified B group vitamins: Secondary | ICD-10-CM | POA: Diagnosis not present

## 2020-05-27 DIAGNOSIS — I82431 Acute embolism and thrombosis of right popliteal vein: Secondary | ICD-10-CM | POA: Diagnosis not present

## 2020-05-27 DIAGNOSIS — Z23 Encounter for immunization: Secondary | ICD-10-CM

## 2020-05-27 DIAGNOSIS — I10 Essential (primary) hypertension: Secondary | ICD-10-CM

## 2020-05-27 DIAGNOSIS — I2699 Other pulmonary embolism without acute cor pulmonale: Secondary | ICD-10-CM

## 2020-05-27 DIAGNOSIS — M810 Age-related osteoporosis without current pathological fracture: Secondary | ICD-10-CM | POA: Diagnosis not present

## 2020-05-27 MED ORDER — CYANOCOBALAMIN 1000 MCG/ML IJ SOLN
1000.0000 ug | Freq: Once | INTRAMUSCULAR | Status: AC
  Administered 2020-05-27: 1000 ug via INTRAMUSCULAR

## 2020-05-27 MED ORDER — ROSUVASTATIN CALCIUM 10 MG PO TABS: ORAL_TABLET | ORAL | 1 refills | Status: DC

## 2020-05-27 MED ORDER — METOPROLOL SUCCINATE ER 25 MG PO TB24: 25.0000 mg | ORAL_TABLET | Freq: Every day | ORAL | 1 refills | Status: DC

## 2020-05-27 NOTE — Patient Instructions (Signed)
Please increase metoprolol from 12.5mg  (1/2 tab) to 25mg  (full tab) once daily.

## 2020-05-27 NOTE — Progress Notes (Signed)
Subjective:    Patient ID: Ruth Jensen, female    DOB: 1950/09/23, 69 y.o.   MRN: [id number]  HPI  69 year old female who presents today for follow up.  Hyperlipidemia- Continues crestor 37m.  Lab Results  Component Value Date   CHOL 156 12/16/2019   HDL 50.70 12/16/2019   LDLCALC 92 12/16/2019   TRIG 68.0 12/16/2019   CHOLHDL 3 12/16/2019   HTN- maintained on toprol xl 289m1/2 tab once daily as well as lisinopril 2.57m3m  BP Readings from Last 3 Encounters:  05/27/20 (!) 155/78  03/18/20 124/74  03/15/20 120/76   b12  Deficiency- due for b12 shot today.    Reports that she developed hives/itching about 3 weeks ago.  She started benadryl which she is taking once a day.   Osteoporosis- continues fosamax once daily.   Review of Systems See HPI  Past Medical History:  Diagnosis Date   Abnormal echocardiogram 07/22/2019   Abnormal EKG 07/22/2019   Acute deep vein thrombosis (DVT) of [place]al vein of right lower extremity [place]   [place] pulmonary embolism ([place]   Chest pain 07/22/2019   Depressed left ventricular ejection fraction 07/22/2019   DVT (deep venous thrombosis) ([place]   Hypertension    Lipid screening 07/22/2019   Obesity (BMI 30-39.9) 07/22/2019   Osteoporosis 11/18/2019   [place]   Single subsegmental pulmonary embolism without acute cor pulmonale ([place]/11/2018     Social History   Socioeconomic History   Marital status: Married    Spouse name: LeeTruman HaywardNumber of children: 2   Years of education: Not on file   Highest education level: Not on file  Occupational History   Occupation: retired   Tobacco Use   Smoking status: Never Smoker   Smokeless tobacco: Never Used  VapScientific laboratory techniciane: Never used  Substance and Sexual Activity   Alcohol use: Not Currently   Drug use: Never   Sexual activity: Not Currently  Other Topics Concern   Not on file  Social  History Narrative   Not on file   Social Determinants of Health   Financial Resource Strain: Low Risk    Difficulty of Paying Living Expenses: Not hard at all  Food Insecurity: No Food Insecurity   Worried About RunCharity fundraiser the Last Year: Never true   RanArboriculturist the Last Year: Never true  Transportation Needs: No Transportation Needs   Lack of Transportation (Medical): No   Lack of Transportation (Non-Medical): No  Physical Activity: Unknown   Days of Exercise per Week: 0 days   Minutes of Exercise per Session: Not on file  Stress:    Feeling of Stress : Not on file  Social Connections: Moderately Isolated   Frequency of Communication with Friends and Family: More than three times a week   Frequency of Social Gatherings with Friends and Family: Never   Attends Religious Services: Never   ActMarine scientist Organizations: No   Attends CluMusic therapistever   Marital Status: Married  IntHuman resources officerolence: Not At Risk   Fear of Current or Ex-Partner: No   Emotionally Abused: No   Physically Abused: No   Sexually Abused: No    Past Surgical History:  Procedure Laterality Date   ABDOMINAL HYSTERECTOMY  2010   due to fibroids   ABDOMINAL HYSTERECTOMY     BREAST BIOPSY  Left    BREAST CYST EXCISION Left    COLONOSCOPY     [facility]. Late 2000's    LEFT HEART CATH AND CORONARY ANGIOGRAPHY N/A 08/03/2019   Procedure: LEFT HEART CATH AND CORONARY ANGIOGRAPHY;  Surgeon: Lorretta Harp, MD;  Location: [place] [facility];  Service: Cardiovascular;  Laterality: N/A;   OOPHORECTOMY      Family History  Problem Relation Age of Onset   Hypertension Mother    CVA Mother        hemiparesis   Hypertension Father    Dementia Father    Prostate cancer Father    Hypertension Sister    CAD Sister        scheduled for pacemaker   Asthma Paternal Grandfather    Hypertension Sister    Skin cancer  Sister        melanoma died at age 69   Hypertension Sister        ? PFO repair   Parkinson's disease Sister    Hypertension Daughter    Hypertension Son        pacemaker   Colon cancer Neg Hx    Esophageal cancer Neg Hx     No Known Allergies  Current Outpatient Medications on File Prior to Visit  Medication Sig Dispense Refill   alendronate (FOSAMAX) 70 MG tablet Take 1 tablet (70 mg total) by mouth every 7 (seven) days. Take with a full glass of water on an empty stomach. 4 tablet 11   Calcium Carbonate-Vitamin D 600-400 MG-UNIT chew tablet Chew 1 tablet by mouth 2 (two) times daily.     Cyanocobalamin (B-12 COMPLIANCE INJECTION) 1000 MCG/ML KIT Inject 1,000 mcg as directed every 30 (thirty) days.      lisinopril (ZESTRIL) 2.5 MG tablet Take 1 tablet (2.5 mg total) by mouth daily. 90 tablet 1   metoprolol succinate (TOPROL XL) 25 MG 24 hr tablet Take 0.5 tablets (12.5 mg total) by mouth daily. 45 tablet 1   Multiple Vitamin (MULTIVITAMIN WITH MINERALS) TABS tablet Take 1 tablet by mouth daily. 30 tablet 0   pantoprazole (PROTONIX) 20 MG tablet Take 1 tablet (20 mg total) by mouth as directed. Take 20 mg twice daily for 8 weeks, then once daily 90 tablet 1   rosuvastatin (CRESTOR) 10 MG tablet TAKE 1 TABLET(10 MG) BY MOUTH DAILY 30 tablet 3   Vitamin D, Ergocalciferol, (DRISDOL) 1.25 MG (50000 UNIT) CAPS capsule Take 1 capsule (50,000 Units total) by mouth every 7 (seven) days. Take for 8 weeks, then OTC Vitamin D 2000 iu daily. 8 capsule 0   nitroGLYCERIN (NITROSTAT) 0.4 MG SL tablet Place 1 tablet (0.4 mg total) under the tongue every 5 (five) minutes as needed. 30 tablet 3   rivaroxaban (XARELTO) 20 MG TABS tablet Take 1 tablet (20 mg total) by mouth daily with supper. 30 tablet 11   Current Facility-Administered Medications on File Prior to Visit  Medication Dose Route Frequency Provider Last Rate Last Admin   cyanocobalamin ((VITAMIN B-12)) injection 1,000 mcg   1,000 mcg Intramuscular Q30 days [name], NP   1,000 mcg at 02/17/20 0932    BP (!) 155/78 (BP Location: Right Arm, Patient Position: Sitting, Cuff Size: Large)    Pulse 95    Temp 98.6 F (37 C) (Oral)    Resp 16    Ht 5' 2"  (1.575 m)    Wt 194 lb (88 kg)    SpO2 100%    BMI  35.48 kg/m       Objective:   Physical Exam Constitutional:      Appearance: She is well-developed.  Neck:     Thyroid: No thyromegaly.  Cardiovascular:     Rate and Rhythm: Normal rate and regular rhythm.     Heart sounds: Normal heart sounds. No murmur heard.   Pulmonary:     Effort: Pulmonary effort is normal. No respiratory distress.     Breath sounds: Normal breath sounds. No wheezing.  Musculoskeletal:     Cervical back: Neck supple.  Skin:    General: Skin is warm and dry.  Neurological:     Mental Status: She is alert and oriented to person, place, and time.  Psychiatric:        Behavior: Behavior normal.        Thought Content: Thought content normal.        Judgment: Judgment normal.           Assessment & Plan:  HTN- bp above goal.  Continue lisinopril 2.30m once daily.  Increase toprol xl from 12.5 to 25monce daily.  B12 deficiency- b12 1000  mcg IM today and monthly.   Hyperlipidemia- LDL at goal. Continue crestor 10110mnce daily.  Hx of PE/DVT- maintained on xarelto which is being managed by hematology.   Osteoporosis- bone density up to date. Continue fosamax.   This visit occurred during the SARS-CoV-2 public health emergency.  Safety protocols were in place, including screening questions prior to the visit, additional usage of staff PPE, and extensive cleaning of exam room while observing appropriate contact time as indicated for disinfecting solutions.

## 2020-05-28 ENCOUNTER — Encounter: Payer: Self-pay | Admitting: Family

## 2020-05-28 LAB — BASIC METABOLIC PANEL
BUN: 10 mg/dL (ref 7–25)
CO2: 28 mmol/L (ref 20–32)
Calcium: 9.4 mg/dL (ref 8.6–10.4)
Chloride: 106 mmol/L (ref 98–110)
Creat: 0.61 mg/dL (ref 0.60–0.93)
Glucose, Bld: 87 mg/dL (ref 65–99)
Potassium: 3.5 mmol/L (ref 3.5–5.3)
Sodium: 140 mmol/L (ref 135–146)

## 2020-05-30 NOTE — Progress Notes (Signed)
Mailed out to pt 

## 2020-06-06 ENCOUNTER — Inpatient Hospital Stay: Payer: Medicare HMO | Admitting: Family

## 2020-06-06 ENCOUNTER — Inpatient Hospital Stay: Payer: Medicare HMO

## 2020-06-10 ENCOUNTER — Other Ambulatory Visit: Payer: Self-pay | Admitting: Gastroenterology

## 2020-06-16 ENCOUNTER — Inpatient Hospital Stay (HOSPITAL_BASED_OUTPATIENT_CLINIC_OR_DEPARTMENT_OTHER): Payer: Medicare HMO | Admitting: Family

## 2020-06-16 ENCOUNTER — Encounter: Payer: Self-pay | Admitting: Family

## 2020-06-16 ENCOUNTER — Telehealth: Payer: Self-pay | Admitting: Family

## 2020-06-16 ENCOUNTER — Other Ambulatory Visit: Payer: Self-pay

## 2020-06-16 ENCOUNTER — Inpatient Hospital Stay: Payer: Medicare HMO | Attending: Hematology & Oncology

## 2020-06-16 VITALS — BP 147/98 | HR 82 | Temp 98.9°F | Resp 18 | Ht 62.0 in | Wt 191.8 lb

## 2020-06-16 DIAGNOSIS — R42 Dizziness and giddiness: Secondary | ICD-10-CM | POA: Diagnosis not present

## 2020-06-16 DIAGNOSIS — I82431 Acute embolism and thrombosis of right popliteal vein: Secondary | ICD-10-CM

## 2020-06-16 DIAGNOSIS — I2699 Other pulmonary embolism without acute cor pulmonale: Secondary | ICD-10-CM | POA: Diagnosis not present

## 2020-06-16 DIAGNOSIS — Z86718 Personal history of other venous thrombosis and embolism: Secondary | ICD-10-CM | POA: Insufficient documentation

## 2020-06-16 DIAGNOSIS — Z7901 Long term (current) use of anticoagulants: Secondary | ICD-10-CM | POA: Insufficient documentation

## 2020-06-16 DIAGNOSIS — D509 Iron deficiency anemia, unspecified: Secondary | ICD-10-CM

## 2020-06-16 LAB — CMP (CANCER CENTER ONLY)
ALT: 8 U/L (ref 0–44)
AST: 13 U/L — ABNORMAL LOW (ref 15–41)
Albumin: 3.9 g/dL (ref 3.5–5.0)
Alkaline Phosphatase: 82 U/L (ref 38–126)
Anion gap: 7 (ref 5–15)
BUN: 14 mg/dL (ref 8–23)
CO2: 29 mmol/L (ref 22–32)
Calcium: 10 mg/dL (ref 8.9–10.3)
Chloride: 106 mmol/L (ref 98–111)
Creatinine: 0.69 mg/dL (ref 0.44–1.00)
GFR, Estimated: >60 mL/min (ref >60)
Glucose, Bld: 118 mg/dL — ABNORMAL HIGH (ref 70–99)
Potassium: 3.7 mmol/L (ref 3.5–5.1)
Sodium: 142 mmol/L (ref 135–145)
Total Bilirubin: 0.6 mg/dL (ref 0.3–1.2)
Total Protein: 7.6 g/dL (ref 6.5–8.1)

## 2020-06-16 LAB — CBC WITH DIFFERENTIAL (CANCER CENTER ONLY)
Abs Immature Granulocytes: 0.01 10*3/uL (ref 0.00–0.07)
Basophils Absolute: 0 10*3/uL (ref 0.0–0.1)
Basophils Relative: 1 %
Eosinophils Absolute: 0.3 10*3/uL (ref 0.0–0.5)
Eosinophils Relative: 4 %
HCT: 36.1 % (ref 36.0–46.0)
Hemoglobin: 11 g/dL — ABNORMAL LOW (ref 12.0–15.0)
Immature Granulocytes: 0 %
Lymphocytes Relative: 32 %
Lymphs Abs: 2.1 10*3/uL (ref 0.7–4.0)
MCH: 23.8 pg — ABNORMAL LOW (ref 26.0–34.0)
MCHC: 30.5 g/dL (ref 30.0–36.0)
MCV: 78.1 fL — ABNORMAL LOW (ref 80.0–100.0)
Monocytes Absolute: 0.5 10*3/uL (ref 0.1–1.0)
Monocytes Relative: 8 %
Neutro Abs: 3.5 10*3/uL (ref 1.7–7.7)
Neutrophils Relative %: 55 %
Platelet Count: 305 10*3/uL (ref 150–400)
RBC: 4.62 MIL/uL (ref 3.87–5.11)
RDW: 17 % — ABNORMAL HIGH (ref 11.5–15.5)
WBC Count: 6.4 10*3/uL (ref 4.0–10.5)
nRBC: 0 % (ref 0.0–0.2)

## 2020-06-16 LAB — FERRITIN: Ferritin: <4 ng/mL — ABNORMAL LOW (ref 11–307)

## 2020-06-16 LAB — IRON AND TIBC
Iron: 74 ug/dL (ref 41–142)
Saturation Ratios: 18 % — ABNORMAL LOW (ref 21–57)
TIBC: 408 ug/dL (ref 236–444)
UIBC: 334 ug/dL (ref 120–384)

## 2020-06-16 LAB — D-DIMER, QUANTITATIVE: D-Dimer, Quant: <0.27 ug/mL-FEU (ref 0.00–0.50)

## 2020-06-16 NOTE — Telephone Encounter (Signed)
Appointments scheduled calendar printed  per 10/21 los

## 2020-06-16 NOTE — Progress Notes (Signed)
Hematology and Oncology Follow Up Visit  Ruth Jensen [id number] Dec 16, 1950 69 y.o. 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 She is doing well and has no complaints at this time.  She is tolerating Xarelto nicely and has had no issues with blood loss. No abnormal bruising, no petechiae.  No fever, chills, n/v, cough, rash, SOB, chest pain, palpitations, abdominal pain or changes in bowel or bladder habits.  She has occasional episodes of dizziness due to vertigo.  No tenderness, numbness or tingling in her extremities.  She has occasional puffiness in her feet and ankles. This comes and goes. Pedal pulses are 2+.  No falls or syncope.  She has maintained a good appetite and is staying well hydrated. Her weight is stable.   ECOG Performance Status: 1 - Symptomatic but completely ambulatory  Medications:  Allergies as of 06/16/2020   No Known Allergies     Medication List       Accurate as of June 16, 2020  9:16 AM. If you have any questions, ask your nurse or doctor.        alendronate 70 MG tablet Commonly known as: FOSAMAX Take 1 tablet (70 mg total) by mouth every 7 (seven) days. Take with a full glass of water on an empty stomach.   B-12 Compliance Injection 1000 MCG/ML Kit Generic drug: Cyanocobalamin Inject 1,000 mcg as directed every 30 (thirty) days.   Calcium Carbonate-Vitamin D 600-400 MG-UNIT chew tablet Chew 1 tablet by mouth 2 (two) times daily.   lisinopril 2.5 MG tablet Commonly known as: ZESTRIL Take 1 tablet (2.5 mg total) by mouth daily.   metoprolol succinate 25 MG 24 hr tablet Commonly known as: Toprol XL Take 1 tablet (25 mg total) by mouth daily.   multivitamin with minerals Tabs tablet Take 1 tablet by mouth daily.   nitroGLYCERIN 0.4 MG  SL tablet Commonly known as: NITROSTAT Place 1 tablet (0.4 mg total) under the tongue every 5 (five) minutes as needed.   pantoprazole 20 MG tablet Commonly known as: Protonix Take 1 tablet (20 mg total) by mouth as directed. Take 20 mg twice daily for 8 weeks, then once daily   rivaroxaban 20 MG Tabs tablet Commonly known as: Xarelto Take 1 tablet (20 mg total) by mouth daily with supper.   rosuvastatin 10 MG tablet Commonly known as: CRESTOR TAKE 1 TABLET(10 MG) BY MOUTH DAILY   Vitamin D (Ergocalciferol) 1.25 MG (50000 UNIT) Caps capsule Commonly known as: DRISDOL Take 1 capsule (50,000 Units total) by mouth every 7 (seven) days. Take for 8 weeks, then OTC Vitamin D 2000 iu daily.       Allergies: No Known Allergies  Past Medical History, Surgical history, Social history, and Family History were reviewed and updated.  Review of Systems: All other 10 point review of systems is negative.   Physical Exam:  vitals were not taken for this visit.   Wt Readings from Last 3 Encounters:  05/27/20 194 lb (88 kg)  03/18/20 200 lb (90.7 kg)  03/15/20 199 lb 6 oz (90.4 kg)    Ocular: Sclerae unicteric, pupils equal, round and reactive to light Ear-nose-throat: Oropharynx clear, dentition fair Lymphatic: No cervical or supraclavicular adenopathy Lungs no rales or rhonchi, good excursion bilaterally Heart regular rate and rhythm,  no murmur appreciated Abd soft, nontender, positive bowel sounds, no liver or spleen tip palpated on exam, no fluid wave  MSK no focal spinal tenderness, no joint edema Neuro: non-focal, well-oriented, appropriate affect Breasts: Deferred   Lab Results  Component Value Date   WBC 6.4 06/16/2020   HGB 11.0 (L) 06/16/2020   HCT 36.1 06/16/2020   MCV 78.1 (L) 06/16/2020   PLT 305 06/16/2020   Lab Results  Component Value Date   FERRITIN 9 (L) 07/21/2019   IRON 41 07/21/2019   TIBC 360 07/21/2019   UIBC 319 07/21/2019   IRONPCTSAT 11 (L)  07/21/2019   Lab Results  Component Value Date   RETICCTPCT 1.9 07/03/2019   RBC 4.62 06/16/2020   No results found for: KPAFRELGTCHN, LAMBDASER, [facility] Lab Results  Component Value Date   IGA 320 04/06/2020   No results found for: Odetta Pink, SPEI   Chemistry      Component Value Date/Time   NA 140 05/27/2020 1519   NA 145 (H) 11/25/2019 1012   K 3.5 05/27/2020 1519   CL 106 05/27/2020 1519   CO2 28 05/27/2020 1519   BUN 10 05/27/2020 1519   BUN 11 11/25/2019 1012   CREATININE 0.61 05/27/2020 1519      Component Value Date/Time   CALCIUM 9.4 05/27/2020 1519   ALKPHOS 104 12/07/2019 1052   AST 14 (L) 12/07/2019 1052   ALT 10 12/07/2019 1052   BILITOT 0.5 12/07/2019 1052       Impression and Plan: MS. Ruth Jensen is a very pleasant 69 yo Ruth Jensen female with left lung PTE and RLE DVT, unprovoked.  She is now on lifelong anticoagulation with Xarelto and tolerating well.  Follow-up in 6 months.  She was encouraged to contact our office with any questions or concerns.   Laverna Peace, NP 10/21/20219:16 AM

## 2020-06-17 ENCOUNTER — Ambulatory Visit: Payer: Medicare HMO | Attending: Internal Medicine

## 2020-06-17 ENCOUNTER — Other Ambulatory Visit (HOSPITAL_BASED_OUTPATIENT_CLINIC_OR_DEPARTMENT_OTHER): Payer: Self-pay | Admitting: Internal Medicine

## 2020-06-17 DIAGNOSIS — Z23 Encounter for immunization: Secondary | ICD-10-CM

## 2020-06-21 ENCOUNTER — Ambulatory Visit: Payer: Medicare HMO

## 2020-06-21 NOTE — Progress Notes (Unsigned)
   Covid-19 Vaccination Clinic  Name:  Ruth Jensen    MRN: [id number] DOB: 1951/03/09  06/21/2020  [name] was observed post Covid-19 immunization for 15 minutes without incident. She was provided with Vaccine Information Sheet and instruction to access the V-Safe system. Vaccinated by [employer].  Ms. Finger was instructed to call 911 with any severe reactions post vaccine: Marland Kitchen Difficulty breathing  . Swelling of face and throat  . A fast heartbeat  . A bad rash all over body  . Dizziness and weakness

## 2020-06-23 MED FILL — PFIZER-BIONTECH COVID-19 VA: 30 | 1 days supply | Qty: 0 | Fill #0

## 2020-06-29 ENCOUNTER — Ambulatory Visit (INDEPENDENT_AMBULATORY_CARE_PROVIDER_SITE_OTHER): Payer: Medicare HMO | Admitting: Family

## 2020-06-29 ENCOUNTER — Ambulatory Visit: Payer: Medicare HMO

## 2020-06-29 ENCOUNTER — Other Ambulatory Visit: Payer: Self-pay

## 2020-06-29 DIAGNOSIS — E538 Deficiency of other specified B group vitamins: Secondary | ICD-10-CM | POA: Diagnosis not present

## 2020-06-29 MED ORDER — CYANOCOBALAMIN 1000 MCG/ML IJ SOLN
1000.0000 ug | Freq: Once | INTRAMUSCULAR | Status: AC
  Administered 2020-06-29: 1000 ug via INTRAMUSCULAR

## 2020-06-29 NOTE — Progress Notes (Signed)
Pt is here today for B12 injection and bp check.Pt was given B12 in right deltoid. Pt bp was 132/80. Per Lenna Sciara pt will continue to keep taking her increased medication as is per last visit.  Pt here for Blood pressure check per Melissa  Pt currently takes: Metoprolol 25mg  once a day  Bp today @=132/80 HR: did not get  Pt advised per Melissa pt will continue to keep taking increased medication as is per last visit.  Reviewed CMA note and agree.  Nance Pear NP

## 2020-07-07 ENCOUNTER — Other Ambulatory Visit: Payer: Self-pay | Admitting: *Deleted

## 2020-07-07 DIAGNOSIS — I2699 Other pulmonary embolism without acute cor pulmonale: Secondary | ICD-10-CM

## 2020-07-07 DIAGNOSIS — I82431 Acute embolism and thrombosis of right popliteal vein: Secondary | ICD-10-CM

## 2020-07-07 MED ORDER — RIVAROXABAN 20 MG PO TABS: 20.0000 mg | ORAL_TABLET | Freq: Every day | ORAL | 11 refills | Status: DC

## 2020-07-27 ENCOUNTER — Other Ambulatory Visit: Payer: Self-pay

## 2020-07-27 ENCOUNTER — Ambulatory Visit (INDEPENDENT_AMBULATORY_CARE_PROVIDER_SITE_OTHER): Payer: Medicare HMO

## 2020-07-27 DIAGNOSIS — E538 Deficiency of other specified B group vitamins: Secondary | ICD-10-CM

## 2020-07-27 MED ORDER — CYANOCOBALAMIN 1000 MCG/ML IJ SOLN
1000.0000 ug | Freq: Once | INTRAMUSCULAR | Status: AC
  Administered 2020-07-27: 1000 ug via INTRAMUSCULAR

## 2020-07-27 NOTE — Progress Notes (Addendum)
Received monthly  b12 injection tolerated well. Next injection scheduled for 08/31/19

## 2020-07-27 NOTE — Progress Notes (Signed)
Was amended

## 2020-08-22 ENCOUNTER — Other Ambulatory Visit: Payer: Self-pay | Admitting: *Deleted

## 2020-08-22 DIAGNOSIS — I2699 Other pulmonary embolism without acute cor pulmonale: Secondary | ICD-10-CM

## 2020-08-22 DIAGNOSIS — I82431 Acute embolism and thrombosis of right popliteal vein: Secondary | ICD-10-CM

## 2020-08-22 MED ORDER — RIVAROXABAN 20 MG PO TABS: 20.0000 mg | ORAL_TABLET | Freq: Every day | ORAL | 11 refills | Status: DC

## 2020-08-29 ENCOUNTER — Other Ambulatory Visit: Payer: Self-pay

## 2020-08-30 ENCOUNTER — Ambulatory Visit (INDEPENDENT_AMBULATORY_CARE_PROVIDER_SITE_OTHER): Payer: Medicare HMO

## 2020-08-30 DIAGNOSIS — E538 Deficiency of other specified B group vitamins: Secondary | ICD-10-CM | POA: Diagnosis not present

## 2020-08-30 MED ORDER — CYANOCOBALAMIN 1000 MCG/ML IJ SOLN
1000.0000 ug | Freq: Once | INTRAMUSCULAR | Status: AC
  Administered 2020-08-30: 1000 ug via INTRAMUSCULAR

## 2020-08-30 NOTE — Progress Notes (Signed)
Pt is here today for B12 injection. Pt was given B12 injection in left deltoid. Pt tolerated well.

## 2020-09-12 ENCOUNTER — Other Ambulatory Visit: Payer: Self-pay | Admitting: Medical

## 2020-09-12 ENCOUNTER — Other Ambulatory Visit: Payer: Self-pay | Admitting: Family

## 2020-09-13 ENCOUNTER — Other Ambulatory Visit: Payer: Self-pay | Admitting: Medical

## 2020-09-16 ENCOUNTER — Ambulatory Visit: Payer: Medicare HMO | Admitting: Cardiology

## 2020-10-04 ENCOUNTER — Other Ambulatory Visit: Payer: Self-pay | Admitting: Family

## 2020-10-04 ENCOUNTER — Ambulatory Visit (INDEPENDENT_AMBULATORY_CARE_PROVIDER_SITE_OTHER): Payer: Medicare HMO | Admitting: Family

## 2020-10-04 ENCOUNTER — Encounter: Payer: Self-pay | Admitting: Family

## 2020-10-04 ENCOUNTER — Other Ambulatory Visit: Payer: Self-pay

## 2020-10-04 ENCOUNTER — Ambulatory Visit: Payer: Medicare HMO

## 2020-10-04 VITALS — BP 142/71 | HR 100 | Temp 98.6°F | Resp 16 | Ht 62.0 in | Wt 193.6 lb

## 2020-10-04 DIAGNOSIS — M25511 Pain in right shoulder: Secondary | ICD-10-CM | POA: Diagnosis not present

## 2020-10-04 DIAGNOSIS — E538 Deficiency of other specified B group vitamins: Secondary | ICD-10-CM | POA: Diagnosis not present

## 2020-10-04 DIAGNOSIS — D649 Anemia, unspecified: Secondary | ICD-10-CM

## 2020-10-04 DIAGNOSIS — Z23 Encounter for immunization: Secondary | ICD-10-CM

## 2020-10-04 DIAGNOSIS — Z86711 Personal history of pulmonary embolism: Secondary | ICD-10-CM

## 2020-10-04 DIAGNOSIS — I10 Essential (primary) hypertension: Secondary | ICD-10-CM

## 2020-10-04 DIAGNOSIS — M810 Age-related osteoporosis without current pathological fracture: Secondary | ICD-10-CM

## 2020-10-04 LAB — CBC WITH DIFFERENTIAL/PLATELET
Basophils Absolute: 0 10*3/uL (ref 0.0–0.1)
Basophils Relative: 0.4 % (ref 0.0–3.0)
Eosinophils Absolute: 0.2 10*3/uL (ref 0.0–0.7)
Eosinophils Relative: 2.8 % (ref 0.0–5.0)
HCT: 33.7 % — ABNORMAL LOW (ref 36.0–46.0)
Hemoglobin: 10.8 g/dL — ABNORMAL LOW (ref 12.0–15.0)
Lymphocytes Relative: 30.9 % (ref 12.0–46.0)
Lymphs Abs: 2.3 10*3/uL (ref 0.7–4.0)
MCHC: 31.9 g/dL (ref 30.0–36.0)
MCV: 76.7 fl — ABNORMAL LOW (ref 78.0–100.0)
Monocytes Absolute: 0.6 10*3/uL (ref 0.1–1.0)
Monocytes Relative: 8.6 % (ref 3.0–12.0)
Neutro Abs: 4.3 10*3/uL (ref 1.4–7.7)
Neutrophils Relative %: 57.3 % (ref 43.0–77.0)
Platelets: 292 10*3/uL (ref 150.0–400.0)
RBC: 4.39 Mil/uL (ref 3.87–5.11)
RDW: 14.9 % (ref 11.5–15.5)
WBC: 7.5 10*3/uL (ref 4.0–10.5)

## 2020-10-04 LAB — BASIC METABOLIC PANEL
BUN: 12 mg/dL (ref 6–23)
CO2: 31 mEq/L (ref 19–32)
Calcium: 9.3 mg/dL (ref 8.4–10.5)
Chloride: 107 mEq/L (ref 96–112)
Creatinine, Ser: 0.57 mg/dL (ref 0.40–1.20)
GFR: 91.82 mL/min (ref >60.00)
Glucose, Bld: 75 mg/dL (ref 70–99)
Potassium: 4 mEq/L (ref 3.5–5.1)
Sodium: 142 mEq/L (ref 135–145)

## 2020-10-04 MED ORDER — CYANOCOBALAMIN 1000 MCG/ML IJ SOLN
1000.0000 ug | Freq: Once | INTRAMUSCULAR | Status: AC
  Administered 2020-10-04: 1000 ug via INTRAMUSCULAR

## 2020-10-04 MED ORDER — SHINGRIX 50 MCG/0.5ML IM SUSR: INTRAMUSCULAR | 1 refills | Status: DC

## 2020-10-04 NOTE — Patient Instructions (Signed)
Please complete lab work prior to leaving.   

## 2020-10-04 NOTE — Progress Notes (Signed)
Subjective:    Patient ID: Ruth Jensen, female    DOB: 08/19/1951, 70 y.o.   MRN: [id number]  HPI  Patient is a 70 yr old female who presents today for follow up.  HTN- maintained on toprol xl 51m BP Readings from Last 3 Encounters:  10/04/20 (!) 142/71  06/16/20 (!) 147/98  05/27/20 (!) 155/78   B12 Deficiency- needs b12 shot- given today.   Osteoporosis- on fosamax  maintained on protonix. Denies gerd symptoms.   Hx of unprovoked PE- states that she could not afford xarelto and so she stopped and started aspirin 812monce daily.  C/o right shoulder pain- hurting for 1 month, worst with raising her right arm.  Some cramping of the right index finger.      Review of Systems See HPI  Past Medical History:  Diagnosis Date  . Abnormal echocardiogram 07/22/2019  . Abnormal EKG 07/22/2019  . Acute deep vein thrombosis (DVT) of popliteal vein of right lower extremity ([place]/11/2018  . Acute pulmonary embolism ([facility]/12/2018  . Chest pain 07/22/2019  . Depressed left ventricular ejection fraction 07/22/2019  . DVT (deep venous thrombosis) ([facility]CO[place]11/11/2018  . Hypertension   . Lipid screening 07/22/2019  . Obesity (BMI 30-39.9) 07/22/2019  . Osteoporosis 11/18/2019  . Pulmonary embolism ([facility]/12[facility]2018  . Single subsegmental pulmonary embolism without acute cor pulmonale (HCP[place]11/11/2018     Social History   Socioeconomic History  . Marital status: Married    Spouse name: LeTruman Hayward. Number of children: 2  . Years of education: Not on file  . Highest education level: Not on file  Occupational History  . Occupation: retired   Tobacco Use  . Smoking status: Never Smoker  . Smokeless tobacco: Never Used  Vaping Use  . Vaping Use: Never used  Substance and Sexual Activity  . Alcohol use: Not Currently  . Drug use: Never  . Sexual activity: Not Currently  Other Topics Concern  . Not on file  Social History Narrative  . Not on file   Social Determinants of  Health   Financial Resource Strain: Low Risk   . Difficulty of Paying Living Expenses: Not hard at all  Food Insecurity: Not on file  Transportation Needs: No Transportation Needs  . Lack of Transportation (Medical): No  . Lack of Transportation (Non-Medical): No  Physical Activity: Not on file  Stress: Not on file  Social Connections: Not on file  Intimate Partner Violence: Not on file    Past Surgical History:  Procedure Laterality Date  . ABDOMINAL HYSTERECTOMY  2010   due to fibroids  . ABDOMINAL HYSTERECTOMY    . BREAST BIOPSY Left   . BREAST CYST EXCISION Left   . [place]. Late 2000's   . LEFT HEART CATH AND CORONARY ANGIOGRAPHY N/A 08/03/2019   Procedure: LEFT HEART CATH AND CORONARY ANGIOGRAPHY;  Surgeon: BeLorretta HarpMD;  [facility];  Service: Cardiovascular;  Laterality: N/A;  . OOPHORECTOMY      Family History  Problem Relation Age of Onset  . Hypertension Mother   . CVA Mother        hemiparesis  . Hypertension Father   . Dementia Father   . Prostate cancer Father   . Hypertension Sister   . CAD Sister        scheduled for pacemaker  . Asthma Paternal Grandfather   . Hypertension Sister   .  Skin cancer Sister        melanoma died at age 39  . Hypertension Sister        ? PFO repair  . Parkinson's disease Sister   . Hypertension Daughter   . Hypertension Son        pacemaker  . Colon cancer Neg Hx   . Esophageal cancer Neg Hx     No Known Allergies  Current Outpatient Medications on File Prior to Visit  Medication Sig Dispense Refill  . alendronate (FOSAMAX) 70 MG tablet Take 1 tablet (70 mg total) by mouth every 7 (seven) days. Take with a full glass of water on an empty stomach. 4 tablet 11  . Calcium Carbonate-Vitamin D 600-400 MG-UNIT chew tablet Chew 1 tablet by mouth 2 (two) times daily.    . Cyanocobalamin (B-12 COMPLIANCE INJECTION) 1000 MCG/ML KIT Inject 1,000 mcg as directed every 30 (thirty)  days.     . metoprolol succinate (TOPROL XL) 25 MG 24 hr tablet Take 1 tablet (25 mg total) by mouth daily. 90 tablet 1  . Multiple Vitamin (MULTIVITAMIN WITH MINERALS) TABS tablet Take 1 tablet by mouth daily. 30 tablet 0  . pantoprazole (PROTONIX) 20 MG tablet Take 1 tablet (20 mg total) by mouth as directed. Take 20 mg twice daily for 8 weeks, then once daily 90 tablet 1  . rosuvastatin (CRESTOR) 10 MG tablet Take 1 tablet (10 mg total) by mouth daily. 30 tablet 3  . Vitamin D, Ergocalciferol, (DRISDOL) 1.25 MG (50000 UNIT) CAPS capsule Take 1 capsule (50,000 Units total) by mouth every 7 (seven) days. Take for 8 weeks, then OTC Vitamin D 2000 iu daily. 8 capsule 0  . lisinopril (ZESTRIL) 2.5 MG tablet TAKE 1 TABLET(2.5 MG) BY MOUTH DAILY (Patient not taking: Reported on 10/04/2020) 90 tablet 1  . nitroGLYCERIN (NITROSTAT) 0.4 MG SL tablet Place 1 tablet (0.4 mg total) under the tongue every 5 (five) minutes as needed. 30 tablet 3  . rivaroxaban (XARELTO) 20 MG TABS tablet Take 1 tablet (20 mg total) by mouth daily with supper. (Patient not taking: Reported on 10/04/2020) 30 tablet 11   Current Facility-Administered Medications on File Prior to Visit  Medication Dose Route Frequency Provider Last Rate Last Admin  . cyanocobalamin ((VITAMIN B-12)) injection 1,000 mcg  1,000 mcg Intramuscular Q30 days [name], NP   1,000 mcg at 02/17/20 0932    BP (!) 142/71 (BP Location: Right Arm, Patient Position: Sitting, Cuff Size: Large)   Pulse 100   Temp 98.6 F (37 C) (Oral)   Resp 16   Ht 5' 2"  (1.575 m)   Wt 193 lb 9.6 oz (87.8 kg)   SpO2 100%   BMI 35.41 kg/m       Objective:   Physical Exam Constitutional:      Appearance: She is well-developed and well-nourished.  Cardiovascular:     Rate and Rhythm: Normal rate and regular rhythm.     Heart sounds: Normal heart sounds. No murmur heard.   Pulmonary:     Effort: Pulmonary effort is normal. No respiratory distress.      Breath sounds: Normal breath sounds. No wheezing.  Psychiatric:        Mood and Affect: Mood and affect normal.        Behavior: Behavior normal.        Thought Content: Thought content normal.        Judgment: Judgment normal.  Assessment & Plan:  Right shoulder pain- new. Refer to sports medicine for further evaluation.  Hx of unprovoked PE- I checked and I do not have any samples of xarelto. I sent a staff message to the hematology team to see if their office could help with patient assistance or if she would be a candidate for coumadin.  B12 deficiency- b12 shot today.  Osteoporosis- continue fosamax 1m once weekly.  GERD- stable on protonix 451m   This visit occurred during the SARS-CoV-2 public health emergency.  Safety protocols were in place, including screening questions prior to the visit, additional usage of staff PPE, and extensive cleaning of exam room while observing appropriate contact time as indicated for disinfecting solutions.

## 2020-10-04 NOTE — Progress Notes (Signed)
cyano

## 2020-10-05 ENCOUNTER — Encounter: Payer: Self-pay | Admitting: Family

## 2020-10-06 ENCOUNTER — Telehealth: Payer: Self-pay | Admitting: *Deleted

## 2020-10-06 MED ORDER — APIXABAN 5 MG PO TABS: 5.0000 mg | ORAL_TABLET | Freq: Two times a day (BID) | ORAL | 2 refills | Status: DC

## 2020-10-06 NOTE — Telephone Encounter (Signed)
Pt notified per order of Dr. Marin Olp to stop Xarelto and Asprin and to take Eliquis 5 mg twice a day.  Pt appreciative of assistance and requests that Eliquis be sent to the [facility] on [address].

## 2020-10-11 DIAGNOSIS — I10 Essential (primary) hypertension: Secondary | ICD-10-CM | POA: Insufficient documentation

## 2020-10-12 ENCOUNTER — Ambulatory Visit (INDEPENDENT_AMBULATORY_CARE_PROVIDER_SITE_OTHER): Payer: Medicare HMO | Admitting: Cardiology

## 2020-10-12 ENCOUNTER — Ambulatory Visit: Payer: Self-pay

## 2020-10-12 ENCOUNTER — Ambulatory Visit (INDEPENDENT_AMBULATORY_CARE_PROVIDER_SITE_OTHER): Payer: Medicare HMO | Admitting: Family Medicine

## 2020-10-12 ENCOUNTER — Other Ambulatory Visit: Payer: Self-pay

## 2020-10-12 ENCOUNTER — Encounter: Payer: Self-pay | Admitting: Cardiology

## 2020-10-12 VITALS — BP 130/80 | HR 86 | Ht 62.0 in | Wt 194.0 lb

## 2020-10-12 VITALS — BP 118/72 | Ht 62.0 in | Wt 194.0 lb

## 2020-10-12 DIAGNOSIS — M7501 Adhesive capsulitis of right shoulder: Secondary | ICD-10-CM | POA: Diagnosis not present

## 2020-10-12 DIAGNOSIS — E782 Mixed hyperlipidemia: Secondary | ICD-10-CM

## 2020-10-12 DIAGNOSIS — I10 Essential (primary) hypertension: Secondary | ICD-10-CM | POA: Diagnosis not present

## 2020-10-12 DIAGNOSIS — E669 Obesity, unspecified: Secondary | ICD-10-CM

## 2020-10-12 DIAGNOSIS — M25511 Pain in right shoulder: Secondary | ICD-10-CM

## 2020-10-12 DIAGNOSIS — I428 Other cardiomyopathies: Secondary | ICD-10-CM

## 2020-10-12 MED ORDER — PREDNISONE 5 MG PO TABS: ORAL_TABLET | ORAL | 0 refills | Status: DC

## 2020-10-12 NOTE — Progress Notes (Signed)
Cardiology Office Note:    Date:  10/12/2020   ID:  Ruth Jensen, DOB Dec 22, 1950, MRN [id number]  PCP:  [name], NP  Cardiologist:  Berniece Salines, DO  Electrophysiologist:  None   Referring MD: [name], NP   Chief Complaint  Patient presents with  . Follow-up    History of Present Illness:    Ruth Jensen is a 70 y.o. female with a hx of with pulmonary embolismand DVTon Xarelto, depressed left ventricular ejection fraction,EF 40 to 45% however recently we repeated her echocardiogram in March 2021 which her EF had now resolved  55 to 60%.. Initially presented on August 21, 2019 to be evaluated for chest pain. During her visit after review of her medical record her echo did show evidence of inferior hypokinesis and with a depressed ejection fraction patient recommended for cardiac catheterization. She underwent cardiac catheterization on August 03, 2019 which showed normal coronaries.  She is here today for follow-up visit.   Past Medical History:  Diagnosis Date  . Abnormal echocardiogram 07/22/2019  . Abnormal EKG 07/22/2019  . Acute deep vein thrombosis (DVT) of popliteal vein of right lower extremity ([place]) 07/01/2019  . Acute pulmonary embolism ([place]) 07/02/2019  . Chest pain 07/22/2019  . Depressed left ventricular ejection fraction 07/22/2019  . DVT (deep venous thrombosis) ([place]) 07/01/2019  . Hypertension   . Lipid screening 07/22/2019  . Obesity (BMI 30-39.9) 07/22/2019  . Osteoporosis 11/18/2019  . Pulmonary embolism ([place]) 07/02/2019  . Single subsegmental pulmonary embolism without acute cor pulmonale ([place]) 07/01/2019    Past Surgical History:  Procedure Laterality Date  . ABDOMINAL HYSTERECTOMY  2010   due to fibroids  . ABDOMINAL HYSTERECTOMY    . BREAST BIOPSY Left   . BREAST CYST EXCISION Left   . [facility]. Late 2000's   . LEFT HEART CATH AND CORONARY ANGIOGRAPHY N/A 08/03/2019   Procedure: LEFT HEART CATH  AND CORONARY ANGIOGRAPHY;  Surgeon: Lorretta Harp, MD;  Location: [facility];  Service: Cardiovascular;  Laterality: N/A;  . OOPHORECTOMY      Current Medications: Current Meds  Medication Sig  . alendronate (FOSAMAX) 70 MG tablet Take 1 tablet (70 mg total) by mouth every 7 (seven) days. Take with a full glass of water on an empty stomach.  Marland Kitchen apixaban (ELIQUIS) 5 MG TABS tablet Take 1 tablet (5 mg total) by mouth 2 (two) times daily.  . Calcium Carbonate-Vitamin D 600-400 MG-UNIT chew tablet Chew 1 tablet by mouth 2 (two) times daily.  . metoprolol succinate (TOPROL XL) 25 MG 24 hr tablet Take 1 tablet (25 mg total) by mouth daily.  . Multiple Vitamin (MULTIVITAMIN WITH MINERALS) TABS tablet Take 1 tablet by mouth daily.  . nitroGLYCERIN (NITROSTAT) 0.4 MG SL tablet Place 1 tablet (0.4 mg total) under the tongue every 5 (five) minutes as needed.  . pantoprazole (PROTONIX) 20 MG tablet Take 1 tablet (20 mg total) by mouth as directed. Take 20 mg twice daily for 8 weeks, then once daily  . rosuvastatin (CRESTOR) 10 MG tablet Take 1 tablet (10 mg total) by mouth daily.  . Vitamin D, Ergocalciferol, (DRISDOL) 1.25 MG (50000 UNIT) CAPS capsule Take 1 capsule (50,000 Units total) by mouth every 7 (seven) days. Take for 8 weeks, then OTC Vitamin D 2000 iu daily.  Marland Kitchen Zoster Vaccine Adjuvanted [facility]) injection Inject 0.5mg  IM now and again in 2-6 months.     Allergies:  Patient has no known allergies.   Social History   Socioeconomic History  . Marital status: Married    Spouse name: Truman Hayward  . Number of children: 2  . Years of education: Not on file  . Highest education level: Not on file  Occupational History  . Occupation: retired   Tobacco Use  . Smoking status: Never Smoker  . Smokeless tobacco: Never Used  Vaping Use  . Vaping Use: Never used  Substance and Sexual Activity  . Alcohol use: Not Currently  . Drug use: Never  . Sexual activity: Not Currently  Other  Topics Concern  . Not on file  Social History Narrative  . Not on file   Social Determinants of Health   Financial Resource Strain: Low Risk   . Difficulty of Paying Living Expenses: Not hard at all  Food Insecurity: Not on file  Transportation Needs: No Transportation Needs  . Lack of Transportation (Medical): No  . Lack of Transportation (Non-Medical): No  Physical Activity: Not on file  Stress: Not on file  Social Connections: Not on file     Family History: The patient's family history includes Asthma in her paternal grandfather; CAD in her sister; CVA in her mother; Dementia in her father; Hypertension in her daughter, father, mother, sister, sister, sister, and son; Parkinson's disease in her sister; Prostate cancer in her father; Skin cancer in her sister. There is no history of Colon cancer or Esophageal cancer.  ROS:   Review of Systems  Constitution: Negative for decreased appetite, fever and weight gain.  HENT: Negative for congestion, ear discharge, hoarse voice and sore throat.   Eyes: Negative for discharge, redness, vision loss in right eye and visual halos.  Cardiovascular: Negative for chest pain, dyspnea on exertion, leg swelling, orthopnea and palpitations.  Respiratory: Negative for cough, hemoptysis, shortness of breath and snoring.   Endocrine: Negative for heat intolerance and polyphagia.  Hematologic/Lymphatic: Negative for bleeding problem. Does not bruise/bleed easily.  Skin: Negative for flushing, nail changes, rash and suspicious lesions.  Musculoskeletal: Negative for arthritis, joint pain, muscle cramps, myalgias, neck pain and stiffness.  Gastrointestinal: Negative for abdominal pain, bowel incontinence, diarrhea and excessive appetite.  Genitourinary: Negative for decreased libido, genital sores and incomplete emptying.  Neurological: Negative for brief paralysis, focal weakness, headaches and loss of balance.  Psychiatric/Behavioral: Negative for  altered mental status, depression and suicidal ideas.  Allergic/Immunologic: Negative for HIV exposure and persistent infections.    EKGs/Labs/Other Studies Reviewed:    The following studies were reviewed today:   EKG: None today   Echo IMPRESSIONS  1. Left ventricular ejection fraction, by estimation, is 55 to 60%. The left ventricle has normal function. The left ventricle has no regional wall motion abnormalities. Left ventricular diastolic parameters are consistent with Grade I diastolic  dysfunction (impaired relaxation).  2. Right ventricular systolic function is normal. The right ventricular size is normal. There is mildly elevated pulmonary artery systolic pressure.  3. The mitral valve is normal in structure. No evidence of mitral valve regurgitation. No evidence of mitral stenosis.  4. The aortic valve is normal in structure. Aortic valve regurgitation is not visualized. No aortic stenosis is present.  5. The inferior vena cava is normal in size with greater than 50% respiratory variability, suggesting right atrial pressure of 3 mmHg.   Recent Labs: 11/25/2019: Magnesium 2.2 06/16/2020: ALT 8 10/04/2020: BUN 12; Creatinine, Ser 0.57; Hemoglobin 10.8; Platelets 292.0; Potassium 4.0; Sodium 142  Recent Lipid Panel  Component Value Date/Time   CHOL 156 12/16/2019 0905   CHOL 234 (H) 07/22/2019 1418   TRIG 68.0 12/16/2019 0905   HDL 50.70 12/16/2019 0905   HDL 51 07/22/2019 1418   CHOLHDL 3 12/16/2019 0905   VLDL 13.6 12/16/2019 0905   LDLCALC 92 12/16/2019 0905   LDLCALC 168 (H) 07/22/2019 1418    Physical Exam:    VS:  BP 130/80 (BP Location: Left Arm, Patient Position: Sitting, Cuff Size: Normal)   Pulse 86   Ht 5\' 2"  (1.575 m)   Wt 194 lb (88 kg)   SpO2 99%   BMI 35.48 kg/m     Wt Readings from Last 3 Encounters:  10/12/20 194 lb (88 kg)  10/04/20 193 lb 9.6 oz (87.8 kg)  06/16/20 191 lb 12.8 oz (87 kg)     GEN: Well nourished, well developed in no  acute distress HEENT: Normal NECK: No JVD; No carotid bruits LYMPHATICS: No lymphadenopathy CARDIAC: S1S2 noted,RRR, no murmurs, rubs, gallops RESPIRATORY:  Clear to auscultation without rales, wheezing or rhonchi  ABDOMEN: Soft, non-tender, non-distended, +bowel sounds, no guarding. EXTREMITIES: No edema, No cyanosis, no clubbing MUSCULOSKELETAL:  No deformity  SKIN: Warm and dry NEUROLOGIC:  Alert and oriented x 3, non-focal PSYCHIATRIC:  Normal affect, good insight  ASSESSMENT:    1. Essential hypertension   2. Mixed hyperlipidemia   3. Obesity (BMI 30-39.9)   4. History of nonischemic cardiomyopathy ([place])    PLAN:     1.  Appears to be doing well from a cardiovascular standpoint.  She had to stop the lisinopril due to nausea when I can restart this medicine as her blood pressure is fine her EF has recovered we will continue to monitor the patient.  She will remain on her Crestor 10 mg daily.  She was recently transitioned from Xarelto to Eliquis for her pulmonary embolism she follows with PCP and oncology.  The patient understands the need to lose weight with diet and exercise. We have discussed specific strategies for this.  The patient is in agreement with the above plan. The patient left the office in stable condition.  The patient will follow up in 1 year or sooner if needed.   Medication Adjustments/Labs and Tests Ordered: Current medicines are reviewed at length with the patient today.  Concerns regarding medicines are outlined above.  No orders of the defined types were placed in this encounter.  No orders of the defined types were placed in this encounter.   There are no Patient Instructions on file for this visit.   Adopting a Healthy Lifestyle.  Know what a healthy weight is for you (roughly BMI <25) and aim to maintain this   Aim for 7+ servings of fruits and vegetables daily   65-80+ fluid ounces of water or unsweet tea for healthy kidneys   Limit to  max 1 drink of alcohol per day; avoid smoking/tobacco   Limit animal fats in diet for cholesterol and heart health - choose grass fed whenever available   Avoid highly processed foods, and foods high in saturated/trans fats   Aim for low stress - take time to unwind and care for your mental health   Aim for 150 min of moderate intensity exercise weekly for heart health, and weights twice weekly for bone health   Aim for 7-9 hours of sleep daily   When it comes to diets, agreement about the perfect plan isnt easy to find, even among the experts. Experts at  the [employer] developed an idea known as the Healthy Eating Plate. Just imagine a plate divided into logical, healthy portions.   The emphasis is on diet quality:   Load up on vegetables and fruits - one-half of your plate: Aim for color and variety, and remember that potatoes dont count.   Go for whole grains - one-quarter of your plate: Whole wheat, barley, wheat berries, quinoa, oats, brown rice, and foods made with them. If you want pasta, go with whole wheat pasta.   Protein power - one-quarter of your plate: Fish, chicken, beans, and nuts are all healthy, versatile protein sources. Limit red meat.   The diet, however, does go beyond the plate, offering a few other suggestions.   Use healthy plant oils, such as olive, canola, soy, corn, sunflower and peanut. Check the labels, and avoid partially hydrogenated oil, which have unhealthy trans fats.   If youre thirsty, drink water. Coffee and tea are good in moderation, but skip sugary drinks and limit milk and dairy products to one or two daily servings.   The type of carbohydrate in the diet is more important than the amount. Some sources of carbohydrates, such as vegetables, fruits, whole grains, and beans-are healthier than others.   Finally, stay active  Signed, Berniece Salines, DO  10/12/2020 10:44 AM    [place]

## 2020-10-12 NOTE — Patient Instructions (Signed)

## 2020-10-12 NOTE — Patient Instructions (Signed)
Nice to meet you Please try heat before exercise and ice after Please try the exercises   Please send me a message in MyChart with any questions or updates.  Please see me back in 4 weeks.   --Dr. Ivalee Strauser  

## 2020-10-12 NOTE — Progress Notes (Signed)
Ruth Jensen - 69 y.o. female MRN [id number]  Date of birth: Aug 13, 1951  SUBJECTIVE:  Including CC & ROS.  No chief complaint on file.   Ruth Jensen is a 70 y.o. female that is presenting with right shoulder pain.  The pain is occurring when she lies on the affected shoulder.  It is intermittent in nature.  No injury or inciting event.  Does have some trouble with her range of motion.   Review of Systems See HPI   HISTORY: Past Medical, Surgical, Social, and Family History Reviewed & Updated per EMR.   Pertinent Historical Findings include:  Past Medical History:  Diagnosis Date  . Abnormal echocardiogram 07/22/2019  . Abnormal EKG 07/22/2019  . Acute deep vein thrombosis (DVT) of popliteal vein of right lower extremity ([place]) 07/01/2019  . Acute pulmonary embolism ([place]) 07/02/2019  . Chest pain 07/22/2019  . Depressed left ventricular ejection fraction 07/22/2019  . DVT (deep venous thrombosis) ([place]) 07/01/2019  . Hypertension   . Lipid screening 07/22/2019  . Obesity (BMI 30-39.9) 07/22/2019  . Osteoporosis 11/18/2019  . Pulmonary embolism ([place]) 07/02/2019  . Single subsegmental pulmonary embolism without acute cor pulmonale ([place]) 07/01/2019    Past Surgical History:  Procedure Laterality Date  . ABDOMINAL HYSTERECTOMY  2010   due to fibroids  . ABDOMINAL HYSTERECTOMY    . BREAST BIOPSY Left   . BREAST CYST EXCISION Left   . [facility]. Late 2000's   . LEFT HEART CATH AND CORONARY ANGIOGRAPHY N/A 08/03/2019   Procedure: LEFT HEART CATH AND CORONARY ANGIOGRAPHY;  Surgeon: Lorretta Harp, MD;  Location: [facility];  Service: Cardiovascular;  Laterality: N/A;  . OOPHORECTOMY      Family History  Problem Relation Age of Onset  . Hypertension Mother   . CVA Mother        hemiparesis  . Hypertension Father   . Dementia Father   . Prostate cancer Father   . Hypertension Sister   . CAD Sister        scheduled for pacemaker  . Asthma  Paternal Grandfather   . Hypertension Sister   . Skin cancer Sister        melanoma died at age 83  . Hypertension Sister        ? PFO repair  . Parkinson's disease Sister   . Hypertension Daughter   . Hypertension Son        pacemaker  . Colon cancer Neg Hx   . Esophageal cancer Neg Hx     Social History   Socioeconomic History  . Marital status: Married    Spouse name: Truman Hayward  . Number of children: 2  . Years of education: Not on file  . Highest education level: Not on file  Occupational History  . Occupation: retired   Tobacco Use  . Smoking status: Never Smoker  . Smokeless tobacco: Never Used  Vaping Use  . Vaping Use: Never used  Substance and Sexual Activity  . Alcohol use: Not Currently  . Drug use: Never  . Sexual activity: Not Currently  Other Topics Concern  . Not on file  Social History Narrative  . Not on file   Social Determinants of Health   Financial Resource Strain: Low Risk   . Difficulty of Paying Living Expenses: Not hard at all  Food Insecurity: Not on file  Transportation Needs: No Transportation Needs  . Lack of Transportation (Medical): No  .  Lack of Transportation (Non-Medical): No  Physical Activity: Not on file  Stress: Not on file  Social Connections: Not on file  Intimate Partner Violence: Not on file     PHYSICAL EXAM:  VS: BP 118/72 (BP Location: Left Arm, Patient Position: Sitting, Cuff Size: Large)   Ht 5\' 2"  (1.575 m)   Wt 194 lb (88 kg)   BMI 35.48 kg/m  Physical Exam Gen: NAD, alert, cooperative with exam, well-appearing MSK:  Right shoulder: Limited external rotation. Normal internal rotation. Normal strength resistance. Normal empty can testing. Neurovascularly intact  Limited ultrasound: Right shoulder:  Normal-appearing biceps tendon. Normal-appearing subscapularis. Supraspinatus with minor degenerative changes. No effusion in the posterior glenohumeral joint.  Summary: No structural acute changes  appreciated on exam.  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  Seems less likely for degenerative changes at this time. -Counseled on home exercise therapy and supportive care. -Prednisone. -Could consider physical therapy or glenohumeral injection or imaging.

## 2020-10-13 DIAGNOSIS — M7501 Adhesive capsulitis of right shoulder: Secondary | ICD-10-CM | POA: Insufficient documentation

## 2020-10-13 NOTE — Assessment & Plan Note (Signed)
Findings seem consistent with frozen shoulder.  Seems less likely for degenerative changes at this time. -Counseled on home exercise therapy and supportive care. -Prednisone. -Could consider physical therapy or glenohumeral injection or imaging.

## 2020-11-01 ENCOUNTER — Other Ambulatory Visit: Payer: Self-pay

## 2020-11-01 ENCOUNTER — Ambulatory Visit (INDEPENDENT_AMBULATORY_CARE_PROVIDER_SITE_OTHER): Payer: Medicare HMO | Admitting: *Deleted

## 2020-11-01 DIAGNOSIS — E538 Deficiency of other specified B group vitamins: Secondary | ICD-10-CM | POA: Diagnosis not present

## 2020-11-01 MED ORDER — CYANOCOBALAMIN 1000 MCG/ML IJ SOLN
1000.0000 ug | Freq: Once | INTRAMUSCULAR | Status: AC
  Administered 2020-11-01: 1000 ug via INTRAMUSCULAR

## 2020-11-01 NOTE — Progress Notes (Signed)
Pt is here today for B12 injection per [name].  Pt was given B12 injection in left deltoid. Pt tolerated well.

## 2020-11-03 ENCOUNTER — Telehealth: Payer: Self-pay

## 2020-11-03 NOTE — Telephone Encounter (Signed)
S/w pt and her appt has been r/s from 12/15/20 as sarah will; be out of the office on thurs    anne

## 2020-11-09 ENCOUNTER — Ambulatory Visit: Payer: Medicare HMO | Admitting: Family Medicine

## 2020-11-09 NOTE — Progress Notes (Deleted)
Ruth Jensen - 70 y.o. female MRN [id number]  Date of birth: 1950/09/18  SUBJECTIVE:  Including CC & ROS.  No chief complaint on file.   Ruth Jensen is a 70 y.o. female that is  ***.  ***   Review of Systems See HPI   HISTORY: Past Medical, Surgical, Social, and Family History Reviewed & Updated per EMR.   Pertinent Historical Findings include:  Past Medical History:  Diagnosis Date  . Abnormal echocardiogram 07/22/2019  . Abnormal EKG 07/22/2019  . Acute deep vein thrombosis (DVT) of popliteal vein of right lower extremity ([place]) 07/01/2019  . Acute pulmonary embolism ([place]) 07/02/2019  . Chest pain 07/22/2019  . Depressed left ventricular ejection fraction 07/22/2019  . DVT (deep venous thrombosis) ([place]) 07/01/2019  . Hypertension   . Lipid screening 07/22/2019  . Obesity (BMI 30-39.9) 07/22/2019  . Osteoporosis 11/18/2019  . Pulmonary embolism ([place]) 07/02/2019  . Single subsegmental pulmonary embolism without acute cor pulmonale ([place]) 07/01/2019    Past Surgical History:  Procedure Laterality Date  . ABDOMINAL HYSTERECTOMY  2010   due to fibroids  . ABDOMINAL HYSTERECTOMY    . BREAST BIOPSY Left   . BREAST CYST EXCISION Left   . [facility]. Late 2000's   . LEFT HEART CATH AND CORONARY ANGIOGRAPHY N/A 08/03/2019   Procedure: LEFT HEART CATH AND CORONARY ANGIOGRAPHY;  Surgeon: Lorretta Harp, MD;  Location: [facility];  Service: Cardiovascular;  Laterality: N/A;  . OOPHORECTOMY      Family History  Problem Relation Age of Onset  . Hypertension Mother   . CVA Mother        hemiparesis  . Hypertension Father   . Dementia Father   . Prostate cancer Father   . Hypertension Sister   . CAD Sister        scheduled for pacemaker  . Asthma Paternal Grandfather   . Hypertension Sister   . Skin cancer Sister        melanoma died at age 42  . Hypertension Sister        ? PFO repair  . Parkinson's disease Sister   . Hypertension  Daughter   . Hypertension Son        pacemaker  . Colon cancer Neg Hx   . Esophageal cancer Neg Hx     Social History   Socioeconomic History  . Marital status: Married    Spouse name: Truman Hayward  . Number of children: 2  . Years of education: Not on file  . Highest education level: Not on file  Occupational History  . Occupation: retired   Tobacco Use  . Smoking status: Never Smoker  . Smokeless tobacco: Never Used  Vaping Use  . Vaping Use: Never used  Substance and Sexual Activity  . Alcohol use: Not Currently  . Drug use: Never  . Sexual activity: Not Currently  Other Topics Concern  . Not on file  Social History Narrative  . Not on file   Social Determinants of Health   Financial Resource Strain: Not on file  Food Insecurity: Not on file  Transportation Needs: Not on file  Physical Activity: Not on file  Stress: Not on file  Social Connections: Not on file  Intimate Partner Violence: Not on file     PHYSICAL EXAM:  VS: There were no vitals taken for this visit. Physical Exam Gen: NAD, alert, cooperative with exam, well-appearing MSK:  ***  ASSESSMENT & PLAN:   No problem-specific Assessment & Plan notes found for this encounter.

## 2020-11-28 ENCOUNTER — Ambulatory Visit: Payer: Medicare HMO | Admitting: Family

## 2020-12-02 ENCOUNTER — Ambulatory Visit (INDEPENDENT_AMBULATORY_CARE_PROVIDER_SITE_OTHER): Payer: Medicare HMO

## 2020-12-02 ENCOUNTER — Other Ambulatory Visit: Payer: Self-pay

## 2020-12-02 DIAGNOSIS — E538 Deficiency of other specified B group vitamins: Secondary | ICD-10-CM | POA: Diagnosis not present

## 2020-12-02 MED ORDER — CYANOCOBALAMIN 1000 MCG/ML IJ SOLN
1000.0000 ug | Freq: Once | INTRAMUSCULAR | Status: AC
  Administered 2020-12-02: 1000 ug via INTRAMUSCULAR

## 2020-12-02 NOTE — Progress Notes (Signed)
Pt here for monthly B12 injection per [name], NP   B12 1045mcg given left IM, and pt tolerated injection well.  Next B12 injection scheduled for next month

## 2020-12-14 ENCOUNTER — Inpatient Hospital Stay: Payer: Medicare HMO | Admitting: Family

## 2020-12-14 ENCOUNTER — Inpatient Hospital Stay: Payer: Medicare HMO | Attending: Family

## 2020-12-15 ENCOUNTER — Other Ambulatory Visit: Payer: Medicare HMO

## 2020-12-15 ENCOUNTER — Ambulatory Visit: Payer: Medicare HMO | Admitting: Family

## 2020-12-27 ENCOUNTER — Other Ambulatory Visit: Payer: Self-pay | Admitting: Family

## 2021-01-04 ENCOUNTER — Ambulatory Visit: Payer: Medicare HMO

## 2021-01-04 ENCOUNTER — Telehealth: Payer: Self-pay

## 2021-01-04 NOTE — Telephone Encounter (Signed)
Caller would like to cancel her appointment for tomorrow.  Telephone; [phone]   Appt cancelled.

## 2021-01-06 ENCOUNTER — Emergency Department (HOSPITAL_BASED_OUTPATIENT_CLINIC_OR_DEPARTMENT_OTHER)
Admission: EM | Admit: 2021-01-06 | Discharge: 2021-01-06 | Disposition: A | Discharge status: Home / Self Care | Payer: Medicare HMO | Attending: Emergency Medicine | Admitting: Emergency Medicine

## 2021-01-06 ENCOUNTER — Other Ambulatory Visit: Payer: Self-pay

## 2021-01-06 ENCOUNTER — Encounter (HOSPITAL_BASED_OUTPATIENT_CLINIC_OR_DEPARTMENT_OTHER): Payer: Self-pay | Admitting: Urology

## 2021-01-06 DIAGNOSIS — Z7901 Long term (current) use of anticoagulants: Secondary | ICD-10-CM | POA: Insufficient documentation

## 2021-01-06 DIAGNOSIS — R200 Anesthesia of skin: Secondary | ICD-10-CM | POA: Diagnosis not present

## 2021-01-06 DIAGNOSIS — I10 Essential (primary) hypertension: Secondary | ICD-10-CM | POA: Diagnosis not present

## 2021-01-06 DIAGNOSIS — M5441 Lumbago with sciatica, right side: Secondary | ICD-10-CM | POA: Diagnosis not present

## 2021-01-06 DIAGNOSIS — M5431 Sciatica, right side: Secondary | ICD-10-CM | POA: Diagnosis not present

## 2021-01-06 DIAGNOSIS — Z79899 Other long term (current) drug therapy: Secondary | ICD-10-CM | POA: Insufficient documentation

## 2021-01-06 DIAGNOSIS — M549 Dorsalgia, unspecified: Secondary | ICD-10-CM | POA: Diagnosis present

## 2021-01-06 MED ORDER — DEXAMETHASONE SODIUM PHOSPHATE 10 MG/ML IJ SOLN
10.0000 mg | Freq: Once | INTRAMUSCULAR | Status: AC
  Administered 2021-01-06: 10 mg via INTRAMUSCULAR
  Filled 2021-01-06: qty 1

## 2021-01-06 MED ORDER — METHYLPREDNISOLONE 4 MG PO TBPK: ORAL_TABLET | Freq: Every day | ORAL | 0 refills | Status: DC

## 2021-01-06 MED ORDER — KETOROLAC TROMETHAMINE 15 MG/ML IJ SOLN
15.0000 mg | Freq: Once | INTRAMUSCULAR | Status: AC
  Administered 2021-01-06: 15 mg via INTRAMUSCULAR
  Filled 2021-01-06: qty 1

## 2021-01-06 NOTE — ED Provider Notes (Signed)
[facility] EMERGENCY DEPARTMENT Provider Note   CSN: [id number] Arrival date & time: 01/06/21  1543     History Chief Complaint  Patient presents with  . Back Pain    Ruth Jensen is a 70 y.o. female.  Pt presents to the ED today with back pain.  Pt said it's been going on for about 1 month.  She said it does not hurt to touch.  It hurts to sit.  It feels better to walk.  She has some numbness and tingling to her right big toe.  She denies any weakness.  No trouble with bowel or bladder.        Past Medical History:  Diagnosis Date  . Abnormal echocardiogram 07/22/2019  . Abnormal EKG 07/22/2019  . Acute deep vein thrombosis (DVT) of popliteal vein of right lower extremity ([place]) 07/01/2019  . Acute pulmonary embolism ([place]) 07/02/2019  . Chest pain 07/22/2019  . Depressed left ventricular ejection fraction 07/22/2019  . DVT (deep venous thrombosis) ([place]) 07/01/2019  . Hypertension   . Lipid screening 07/22/2019  . Obesity (BMI 30-39.9) 07/22/2019  . Osteoporosis 11/18/2019  . Pulmonary embolism ([place]) 07/02/2019  . Single subsegmental pulmonary embolism without acute cor pulmonale ([place]) 07/01/2019    Patient Active Problem List   Diagnosis Date Noted  . 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 Hypertension   . Essential hypertension 03/18/2020  . Mixed hyperlipidemia 03/18/2020  . Osteoporosis 11/18/2019  . Chest pain 07/22/2019  . Abnormal echocardiogram 07/22/2019  . Abnormal EKG 07/22/2019  . Lipid screening 07/22/2019  . Obesity (BMI 30-39.9) 07/22/2019  . Depressed left ventricular ejection fraction 07/22/2019  . Acute pulmonary embolism ([place]) 07/02/2019  . Pulmonary embolism ([place]) 07/02/2019  . Single subsegmental pulmonary embolism without acute cor pulmonale ([place]) 07/01/2019  . Acute deep vein thrombosis (DVT) of popliteal vein of right lower extremity ([place]) 07/01/2019  . DVT (deep venous thrombosis) ([place]) 07/01/2019    Past  Surgical History:  Procedure Laterality Date  . ABDOMINAL HYSTERECTOMY  2010   due to fibroids  . ABDOMINAL HYSTERECTOMY    . BREAST BIOPSY Left   . BREAST CYST EXCISION Left   . [facility]. Late 2000's   . LEFT HEART CATH AND CORONARY ANGIOGRAPHY N/A 08/03/2019   Procedure: LEFT HEART CATH AND CORONARY ANGIOGRAPHY;  Surgeon: Lorretta Harp, MD;  Location: [facility];  Service: Cardiovascular;  Laterality: N/A;  . OOPHORECTOMY       OB History   No obstetric history on file.     Family History  Problem Relation Age of Onset  . Hypertension Mother   . CVA Mother        hemiparesis  . Hypertension Father   . Dementia Father   . Prostate cancer Father   . Hypertension Sister   . CAD Sister        scheduled for pacemaker  . Asthma Paternal Grandfather   . Hypertension Sister   . Skin cancer Sister        melanoma died at age 70  . Hypertension Sister        ? PFO repair  . Parkinson's disease Sister   . Hypertension Daughter   . Hypertension Son        pacemaker  . Colon cancer Neg Hx   . Esophageal cancer Neg Hx     Social History   Tobacco Use  . Smoking status: Never  Smoker  . Smokeless tobacco: Never Used  Vaping Use  . Vaping Use: Never used  Substance Use Topics  . Alcohol use: Not Currently  . Drug use: Never    Home Medications Prior to Admission medications   Medication Sig Start Date End Date Taking? Authorizing Provider  methylPREDNISolone (MEDROL DOSEPAK) 4 MG TBPK tablet Take by mouth daily. Day 1:  2 pills at breakfast, 1 pill at lunch, 1 pill after supper, 2 pills at bedtime;Day 2:  1 pill at breakfast, 1 pill at lunch, 1 pill after supper, 2 pills at bedtime;Day 3:  1 pill at breakfast, 1 pill at lunch, 1 pill after supper, 1 pill at bedtime;Day 4:  1 pill at breakfast, 1 pill at lunch, 1 pill at bedtime;Day 5:  1 pill at breakfast, 1 pill at bedtime;Day 6:  1 pill at breakfast 01/06/21  Yes Isla Pence, MD   alendronate (FOSAMAX) 70 MG tablet Take 1 tablet (70 mg total) by mouth every 7 (seven) days. Take with a full glass of water on an empty stomach. 02/03/20   [name], NP  apixaban (ELIQUIS) 5 MG TABS tablet Take 1 tablet (5 mg total) by mouth 2 (two) times daily. 10/06/20   Volanda Napoleon, MD  Calcium Carbonate-Vitamin D 600-400 MG-UNIT chew tablet Chew 1 tablet by mouth 2 (two) times daily. 11/18/19   [name], NP  COVID-19 mRNA vaccine, Pfizer, 30 MCG/0.3ML injection INJECT AS DIRECTED 06/17/20 06/17/21  Carlyle Basques, MD  metoprolol succinate (TOPROL-XL) 25 MG 24 hr tablet TAKE 1 TABLET(25 MG) BY MOUTH DAILY 12/27/20   [name], NP  Multiple Vitamin (MULTIVITAMIN WITH MINERALS) TABS tablet Take 1 tablet by mouth daily. 07/04/19   Sheikh, Georgina Quint Latif, DO  nitroGLYCERIN (NITROSTAT) 0.4 MG SL tablet Place 1 tablet (0.4 mg total) under the tongue every 5 (five) minutes as needed. 07/22/19 03/18/20  Tobb, Kardie, DO  pantoprazole (PROTONIX) 20 MG tablet Take 1 tablet (20 mg total) by mouth as directed. Take 20 mg twice daily for 8 weeks, then once daily 03/15/20   Cirigliano, Vito V, DO  predniSONE (DELTASONE) 5 MG tablet Take 6 pills for first day, 5 pills second day, 4 pills third day, 3 pills fourth day, 2 pills the fifth day, and 1 pill sixth day. 10/12/20   Rosemarie Ax, MD  rosuvastatin (CRESTOR) 10 MG tablet Take 1 tablet (10 mg total) by mouth daily. 09/13/20   [name], NP  Vitamin D, Ergocalciferol, (DRISDOL) 1.25 MG (50000 UNIT) CAPS capsule Take 1 capsule (50,000 Units total) by mouth every 7 (seven) days. Take for 8 weeks, then OTC Vitamin D 2000 iu daily. 04/15/20   Cirigliano, Vito V, DO  Zoster Vaccine Adjuvanted [facility]) injection INJECT 0.5MG  INTO THE MUSCLE NOW AND AGAIN IN 2-6 MONTHS. 10/04/20 10/04/21  [name], NP    Allergies    Patient has no known allergies.  Review of Systems   Review of Systems  Musculoskeletal: Positive  for back pain.  All other systems reviewed and are negative.   Physical Exam Updated Vital Signs BP (!) 137/99 (BP Location: Left Arm)   Pulse 93   Temp 98.7 F (37.1 C) (Tympanic)   Resp 18   Ht 5\' 2"  (1.575 m)   Wt 84.8 kg   SpO2 99%   BMI 34.20 kg/m   Physical Exam Vitals and nursing note reviewed.  Constitutional:      Appearance: Normal appearance.  HENT:     Head:  Normocephalic and atraumatic.     Right Ear: External ear normal.     Left Ear: External ear normal.     Nose: Nose normal.     Mouth/Throat:     Mouth: Mucous membranes are moist.     Pharynx: Oropharynx is clear.  Eyes:     Extraocular Movements: Extraocular movements intact.     Conjunctiva/sclera: Conjunctivae normal.     Pupils: Pupils are equal, round, and reactive to light.  Cardiovascular:     Rate and Rhythm: Normal rate and regular rhythm.     Pulses: Normal pulses.     Heart sounds: Normal heart sounds.  Pulmonary:     Effort: Pulmonary effort is normal.     Breath sounds: Normal breath sounds.  Abdominal:     General: Abdomen is flat. Bowel sounds are normal.     Palpations: Abdomen is soft.  Musculoskeletal:        General: Normal range of motion.     Cervical back: Normal range of motion and neck supple.  Skin:    General: Skin is warm.     Capillary Refill: Capillary refill takes less than 2 seconds.  Neurological:     General: No focal deficit present.     Mental Status: She is alert and oriented to person, place, and time.  Psychiatric:        Mood and Affect: Mood normal.        Behavior: Behavior normal.     ED Results / Procedures / Treatments   Labs (all labs ordered are listed, but only abnormal results are displayed) Labs Reviewed - No data to display  EKG None  Radiology No results found.  Procedures Procedures   Medications Ordered in ED Medications  ketorolac (TORADOL) 15 MG/ML injection 15 mg (has no administration in time range)  dexamethasone  (DECADRON) injection 10 mg (has no administration in time range)    ED Course  I have reviewed the triage vital signs and the nursing notes.  Pertinent labs & imaging results that were available during my care of the patient were reviewed by me and considered in my medical decision making (see chart for details).    MDM Rules/Calculators/A&P                          I suspect pt has sciatica.  She will be given toradol and decadron in the ED and d/c with a medrol dose pack.  She is to f/u with sports medicine for PT.  Return if worse.   Final Clinical Impression(s) / ED Diagnoses Final diagnoses:  Sciatica of right side    Rx / DC Orders ED Discharge Orders         Ordered    methylPREDNISolone (MEDROL DOSEPAK) 4 MG TBPK tablet  Daily        01/06/21 1607           Isla Pence, MD 01/06/21 1609

## 2021-01-06 NOTE — ED Triage Notes (Signed)
Right sides lower back pain x 1 month with numbness and tingling to right big toe

## 2021-01-10 ENCOUNTER — Other Ambulatory Visit: Payer: Self-pay

## 2021-01-10 ENCOUNTER — Telehealth (INDEPENDENT_AMBULATORY_CARE_PROVIDER_SITE_OTHER): Payer: Medicare HMO | Admitting: Family

## 2021-01-10 DIAGNOSIS — M5416 Radiculopathy, lumbar region: Secondary | ICD-10-CM

## 2021-01-10 DIAGNOSIS — R059 Cough, unspecified: Secondary | ICD-10-CM | POA: Diagnosis not present

## 2021-01-10 DIAGNOSIS — M5431 Sciatica, right side: Secondary | ICD-10-CM | POA: Diagnosis not present

## 2021-01-10 NOTE — Assessment & Plan Note (Signed)
Sounds like allergy related cough. Recommended trial of claritin 10mg  once daily and to call if symptoms worsen or fail to improve with Claritin.

## 2021-01-10 NOTE — Patient Instructions (Signed)
Try adding claritin 10mg  once daily.

## 2021-01-10 NOTE — Progress Notes (Signed)
MyChart Video Visit    Virtual Visit via Video Note   This visit type was conducted due to national recommendations for restrictions regarding the COVID-19 Pandemic (e.g. social distancing) in an effort to limit this patient's exposure and mitigate transmission in our community. This patient is at least at moderate risk for complications without adequate follow up. This format is felt to be most appropriate for this patient at this time. Physical exam was limited by quality of the video and audio technology used for the visit. CMA was able to get the patient set up on a video visit.  Patient location: home  Patient and provider in visit Provider location: Office  I discussed the limitations of evaluation and management by telemedicine and the availability of in person appointments. The patient expressed understanding and agreed to proceed.  Visit Date: 01/10/2021  Today's healthcare provider: Nance Pear, NP     Subjective:    Patient ID: Ruth Jensen, female    DOB: 1951-08-13, 70 y.o.   MRN: [id number]  Chief Complaint  Patient presents with  . Follow-up    Hospital follow up for back  pain mostly on the right. Taking prednisone but "not helping"  . Cough    Patient reports of productive cough since last week.    HPI   Patient is in today for c/o back pain.  She was seen in the ED on 5/13 and diagnosed with right sided sciatica. (ED record is reviewed). She was treated with a medrol dose pak and advised to follow up with Sports medicine. Reports that pain "is by the right back hip bone."  Feels like she has to "crawl out of the bed lately." She feels like symptoms have gotten worse since she started the medrol dose pak on 01/06/21.   She reports a tingling in the right leg. Denies leg weakness, loss of bowel/bladder/control.  She did have an MRI of the lumbar spine performed back in 2009 which noted the following:     IMPRESSION:  L3-4: Annular bulging in the  left posterolateral direction. Mild  narrowing of the left lateral recess and mild encroachment upon the  intervertebral foramen on the left. This could cause neural  irritation.    L4-5: Disc bulge circumferentially. Mild facet and ligamentous  hypertrophy. Mild narrowing of the lateral recesses without  definite neural compression.    L5-S1: Leftward projecting osteophytes could possibly cause neural  irritation in an extraforaminal location. Canal and foramina  widely patent.    Patient also reports that she has had a cough/cold symptoms x 1 week. Took some nyquil and tylenol which helped the congestion.  She reports some productive cough at times. Denies fever.    Past Medical History:  Diagnosis Date  . Abnormal echocardiogram 07/22/2019  . Abnormal EKG 07/22/2019  . Acute deep vein thrombosis (DVT) of popliteal vein of right lower extremity ([place]) 07/01/2019  . Acute pulmonary embolism ([place]) 07/02/2019  . Chest pain 07/22/2019  . Depressed left ventricular ejection fraction 07/22/2019  . DVT (deep venous thrombosis) ([place]) 07/01/2019  . Hypertension   . Lipid screening 07/22/2019  . Obesity (BMI 30-39.9) 07/22/2019  . Osteoporosis 11/18/2019  . Pulmonary embolism ([place]) 07/02/2019  . Single subsegmental pulmonary embolism without acute cor pulmonale ([place]) 07/01/2019    Past Surgical History:  Procedure Laterality Date  . ABDOMINAL HYSTERECTOMY  2010   due to fibroids  . ABDOMINAL HYSTERECTOMY    . BREAST BIOPSY Left   .  BREAST CYST EXCISION Left   . [facility]. Late 2000's   . LEFT HEART CATH AND CORONARY ANGIOGRAPHY N/A 08/03/2019   Procedure: LEFT HEART CATH AND CORONARY ANGIOGRAPHY;  Surgeon: Lorretta Harp, MD;  Location: [facility];  Service: Cardiovascular;  Laterality: N/A;  . OOPHORECTOMY      Family History  Problem Relation Age of Onset  . Hypertension Mother   . CVA Mother        hemiparesis  . Hypertension Father    . Dementia Father   . Prostate cancer Father   . Hypertension Sister   . CAD Sister        scheduled for pacemaker  . Asthma Paternal Grandfather   . Hypertension Sister   . Skin cancer Sister        melanoma died at age 56  . Hypertension Sister        ? PFO repair  . Parkinson's disease Sister   . Hypertension Daughter   . Hypertension Son        pacemaker  . Colon cancer Neg Hx   . Esophageal cancer Neg Hx     Social History   Socioeconomic History  . Marital status: Married    Spouse name: Truman Hayward  . Number of children: 2  . Years of education: Not on file  . Highest education level: Not on file  Occupational History  . Occupation: retired   Tobacco Use  . Smoking status: Never Smoker  . Smokeless tobacco: Never Used  Vaping Use  . Vaping Use: Never used  Substance and Sexual Activity  . Alcohol use: Not Currently  . Drug use: Never  . Sexual activity: Not Currently  Other Topics Concern  . Not on file  Social History Narrative  . Not on file   Social Determinants of Health   Financial Resource Strain: Not on file  Food Insecurity: Not on file  Transportation Needs: Not on file  Physical Activity: Not on file  Stress: Not on file  Social Connections: Not on file  Intimate Partner Violence: Not on file    Outpatient Medications Prior to Visit  Medication Sig Dispense Refill  . alendronate (FOSAMAX) 70 MG tablet Take 1 tablet (70 mg total) by mouth every 7 (seven) days. Take with a full glass of water on an empty stomach. 4 tablet 11  . apixaban (ELIQUIS) 5 MG TABS tablet Take 1 tablet (5 mg total) by mouth 2 (two) times daily. 60 tablet 2  . Calcium Carbonate-Vitamin D 600-400 MG-UNIT chew tablet Chew 1 tablet by mouth 2 (two) times daily.    Marland Kitchen COVID-19 mRNA vaccine, Pfizer, 30 MCG/0.3ML injection INJECT AS DIRECTED .3 mL 0  . methylPREDNISolone (MEDROL DOSEPAK) 4 MG TBPK tablet Take by mouth daily. Day 1:  2 pills at breakfast, 1 pill at lunch, 1 pill  after supper, 2 pills at bedtime;Day 2:  1 pill at breakfast, 1 pill at lunch, 1 pill after supper, 2 pills at bedtime;Day 3:  1 pill at breakfast, 1 pill at lunch, 1 pill after supper, 1 pill at bedtime;Day 4:  1 pill at breakfast, 1 pill at lunch, 1 pill at bedtime;Day 5:  1 pill at breakfast, 1 pill at bedtime;Day 6:  1 pill at breakfast 21 tablet 0  . metoprolol succinate (TOPROL-XL) 25 MG 24 hr tablet TAKE 1 TABLET(25 MG) BY MOUTH DAILY 90 tablet 1  . Multiple Vitamin (MULTIVITAMIN WITH MINERALS)  TABS tablet Take 1 tablet by mouth daily. 30 tablet 0  . pantoprazole (PROTONIX) 20 MG tablet Take 1 tablet (20 mg total) by mouth as directed. Take 20 mg twice daily for 8 weeks, then once daily 90 tablet 1  . predniSONE (DELTASONE) 5 MG tablet Take 6 pills for first day, 5 pills second day, 4 pills third day, 3 pills fourth day, 2 pills the fifth day, and 1 pill sixth day. 21 tablet 0  . rosuvastatin (CRESTOR) 10 MG tablet Take 1 tablet (10 mg total) by mouth daily. 30 tablet 3  . Vitamin D, Ergocalciferol, (DRISDOL) 1.25 MG (50000 UNIT) CAPS capsule Take 1 capsule (50,000 Units total) by mouth every 7 (seven) days. Take for 8 weeks, then OTC Vitamin D 2000 iu daily. 8 capsule 0  . Zoster Vaccine Adjuvanted (SHINGRIX) injection INJECT 0.5MG  INTO THE MUSCLE NOW AND AGAIN IN 2-6 MONTHS. 1 each 1  . nitroGLYCERIN (NITROSTAT) 0.4 MG SL tablet Place 1 tablet (0.4 mg total) under the tongue every 5 (five) minutes as needed. 30 tablet 3   No facility-administered medications prior to visit.    No Known Allergies  ROS     Objective:    Physical Exam  There were no vitals taken for this visit. Wt Readings from Last 3 Encounters:  01/06/21 187 lb (84.8 kg)  10/12/20 194 lb (88 kg)  10/12/20 194 lb (88 kg)    Diabetic Foot Exam - Simple   No data filed    Lab Results  Component Value Date   WBC 7.5 10/04/2020   HGB 10.8 (L) 10/04/2020   HCT 33.7 (L) 10/04/2020   PLT 292.0 10/04/2020    GLUCOSE 75 10/04/2020   CHOL 156 12/16/2019   TRIG 68.0 12/16/2019   HDL 50.70 12/16/2019   LDLCALC 92 12/16/2019   ALT 8 06/16/2020   AST 13 (L) 06/16/2020   NA 142 10/04/2020   K 4.0 10/04/2020   CL 107 10/04/2020   CREATININE 0.57 10/04/2020   BUN 12 10/04/2020   CO2 31 10/04/2020   HGBA1C 5.5 07/03/2019    No results found for: TSH Lab Results  Component Value Date   WBC 7.5 10/04/2020   HGB 10.8 (L) 10/04/2020   HCT 33.7 (L) 10/04/2020   MCV 76.7 (L) 10/04/2020   PLT 292.0 10/04/2020   Lab Results  Component Value Date   NA 142 10/04/2020   K 4.0 10/04/2020   CO2 31 10/04/2020   GLUCOSE 75 10/04/2020   BUN 12 10/04/2020   CREATININE 0.57 10/04/2020   BILITOT 0.6 06/16/2020   ALKPHOS 82 06/16/2020   AST 13 (L) 06/16/2020   ALT 8 06/16/2020   PROT 7.6 06/16/2020   ALBUMIN 3.9 06/16/2020   CALCIUM 9.3 10/04/2020   ANIONGAP 7 06/16/2020   GFR 91.82 10/04/2020   Lab Results  Component Value Date   CHOL 156 12/16/2019   Lab Results  Component Value Date   HDL 50.70 12/16/2019   Lab Results  Component Value Date   LDLCALC 92 12/16/2019   Lab Results  Component Value Date   TRIG 68.0 12/16/2019   Lab Results  Component Value Date   CHOLHDL 3 12/16/2019   Lab Results  Component Value Date   HGBA1C 5.5 07/03/2019       Assessment & Plan:   Problem List Items Addressed This Visit   None     I am having Arion L. Huizenga maintain her multivitamin with minerals, nitroGLYCERIN, Calcium  Carbonate-Vitamin D, alendronate, pantoprazole, Vitamin D (Ergocalciferol), rosuvastatin, apixaban, predniSONE, Zoster Vaccine Adjuvanted, COVID-19 mRNA vaccine ([place]), metoprolol succinate, and methylPREDNISolone.  No orders of the defined types were placed in this encounter.   I discussed the assessment and treatment plan with the patient. The patient was provided an opportunity to ask questions and all were answered. The patient agreed with the plan and  demonstrated an understanding of the instructions.   The patient was advised to call back or seek an in-person evaluation if the symptoms worsen or if the condition fails to improve as anticipated.    Nance Pear, NP [employer] [facility] [phone] (phone) [phone] (fax)  [place]

## 2021-01-10 NOTE — Assessment & Plan Note (Signed)
Not improving with steroid medication. No red flags.  Would likely benefit from follow up MRI but will need face to face exam- she plans to see sports medicine and I will defer to Sports med.  Unfortunately, she is not a good candidate for NSAIDS due to her chronic use of Eliquis.

## 2021-01-11 NOTE — Progress Notes (Signed)
    Subjective:    CC: Low back pain  I, Molly Weber, LAT, ATC, am serving as scribe for Dr. Lynne Leader.  HPI: Pt is a 70 y/o female presenting w/ c/o acute-on-chronic R-sided low back pain w/ numbness/tingling into her R great toe x approximately one month w/ no known MOI.  She locates her pain to her R lower back.  Radiating pain: No R LE numbness/tingling: yes in her R great toe Aggravating factors: sitting; coughing; sneezing; rotation; laying on her R side Treatments tried: medrol dose pack; Tylenol; heat  Diagnostic testing: L-spine MRI- 04/08/08  Pertinent review of Systems: No fevers or chills  Relevant historical information: DVT history   Objective:    Vitals:   01/12/21 0850  BP: (!) 142/88  Pulse: 84  SpO2: 98%   General: Well Developed, well nourished, and in no acute distress.   MSK: L-spine normal.  Nontender midline.  Tender palpation right lumbar paraspinal musculature. Decreased lumbar motion to rotation and lateral flexion. Extremity strength reflexes and sensation are equal normal throughout. Negative slump test.  Lab and Radiology Results  X-ray images L-spine obtained today personally and independently interpreted. No acute fractures are visible.  Mild degenerative changes lower portion of lumbar spine. Await formal radiology review   Impression and Recommendations:    Assessment and Plan: 70 y.o. female with 1 month of significant right-sided low back pain.  This is most likely due to lumbosacral strain and spasm.  Doubtful for radiculopathy.  She does have some paresthesias at the right great toe.  This could be L4 radiculopathy however she does not have pain or paresthesias elsewhere in the leg indicating that most likely her toe symptoms are more peripheral nerve issue than lumbar radiculopathy.  Discussed options.  Plan for heating pad TENS unit and physical therapy.  Also low-dose tizanidine at bedtime mostly.  Discussed gabapentin.   Her paresthesias are not bad enough at this time to warrant gabapentin.  Recheck in 6 weeks.  If not improving next step would be to consider MRI or even nerve conduction study.Marland Kitchen  PDMP not reviewed this encounter. Orders Placed This Encounter  Procedures  . DG Lumbar Spine 2-3 Views    Standing Status:   Future    Number of Occurrences:   1    Standing Expiration Date:   02/12/2021    Order Specific Question:   Reason for Exam (SYMPTOM  OR DIAGNOSIS REQUIRED)    Answer:   Low back pain    Order Specific Question:   Preferred imaging location?    Answer:   [facility]  . Ambulatory referral to Physical Therapy    Referral Priority:   Routine    Referral Type:   Physical Medicine    Referral Reason:   Specialty Services Required    Requested Specialty:   Physical Therapy   Meds ordered this encounter  Medications  . tiZANidine (ZANAFLEX) 4 MG tablet    Sig: Take 1 tablet (4 mg total) by mouth every 8 (eight) hours as needed for muscle spasms.    Dispense:  30 tablet    Refill:  1    Discussed warning signs or symptoms. Please see discharge instructions. Patient expresses understanding.   The above documentation has been reviewed and is accurate and complete Lynne Leader, M.D.

## 2021-01-12 ENCOUNTER — Ambulatory Visit: Payer: Medicare HMO | Admitting: Family Medicine

## 2021-01-12 ENCOUNTER — Other Ambulatory Visit: Payer: Self-pay

## 2021-01-12 ENCOUNTER — Encounter: Payer: Self-pay | Admitting: Family Medicine

## 2021-01-12 ENCOUNTER — Ambulatory Visit (INDEPENDENT_AMBULATORY_CARE_PROVIDER_SITE_OTHER): Payer: Medicare HMO

## 2021-01-12 VITALS — BP 142/88 | HR 84 | Ht 62.0 in | Wt 190.8 lb

## 2021-01-12 DIAGNOSIS — M545 Low back pain, unspecified: Secondary | ICD-10-CM

## 2021-01-12 DIAGNOSIS — G8929 Other chronic pain: Secondary | ICD-10-CM | POA: Diagnosis not present

## 2021-01-12 DIAGNOSIS — R202 Paresthesia of skin: Secondary | ICD-10-CM

## 2021-01-12 MED ORDER — TIZANIDINE HCL 4 MG PO TABS: 4.0000 mg | ORAL_TABLET | Freq: Three times a day (TID) | ORAL | 1 refills | Status: DC | PRN

## 2021-01-12 NOTE — Patient Instructions (Addendum)
Thank you for coming in today.  I've referred you to Physical Therapy.  Let us know if you don't hear from them in one week.  Use the muscle relaxer mostly at bedtime as needed.   Use heating pad. Use TENS unit.   Recheck in 6 weeks.   If not improving or if worsening let me know sooner.   Let me know if you have problem.    TENS UNIT: This is helpful for muscle pain and spasm.   Search and Purchase a TENS 7000 2nd edition at  www.tenspros.com or www.[email or web address].com It should be less than $30.     TENS unit instructions: Do not shower or bathe with the unit on . Turn the unit off before removing electrodes or batteries . If the electrodes lose stickiness add a drop of water to the electrodes after they are disconnected from the unit and place on plastic sheet. If you continued to have difficulty, call the TENS unit company to purchase more electrodes. . Do not apply lotion on the skin area prior to use. Make sure the skin is clean and dry as this will help prolong the life of the electrodes. . After use, always check skin for unusual red areas, rash or other skin difficulties. If there are any skin problems, does not apply electrodes to the same area. . Never remove the electrodes from the unit by pulling the wires. . Do not use the TENS unit or electrodes other than as directed. . Do not change electrode placement without consultating your therapist or physician. Marland Kitchen Keep 2 fingers with between each electrode. . Wear time ratio is 2:1, on to off times.    For example on for 30 minutes off for 15 minutes and then on for 30 minutes off for 15 minutes

## 2021-01-16 NOTE — Progress Notes (Signed)
X-ray lumbar spine shows some arthritis changes.

## 2021-01-30 ENCOUNTER — Ambulatory Visit: Payer: Medicare HMO | Attending: Family Medicine | Admitting: Physical Therapy

## 2021-01-30 ENCOUNTER — Other Ambulatory Visit: Payer: Self-pay

## 2021-01-30 ENCOUNTER — Encounter: Payer: Self-pay | Admitting: Physical Therapy

## 2021-01-30 DIAGNOSIS — R29898 Other symptoms and signs involving the musculoskeletal system: Secondary | ICD-10-CM

## 2021-01-30 DIAGNOSIS — R2689 Other abnormalities of gait and mobility: Secondary | ICD-10-CM | POA: Insufficient documentation

## 2021-01-30 DIAGNOSIS — M545 Low back pain, unspecified: Secondary | ICD-10-CM | POA: Diagnosis not present

## 2021-01-30 NOTE — Therapy (Signed)
[facility] [address]  [place], [place], [address] Phone: [phone]   Fax:  [phone]  Physical Therapy Evaluation  Patient Details  Name: Ruth Jensen MRN: [id number] Date of Birth: May 15, 1951 Referring Provider (PT): Lynne Leader, MD   Encounter Date: 01/30/2021   PT End of Session - 01/30/21 1002    Visit Number 1    Number of Visits 7    Date for PT Re-Evaluation 03/13/21    Authorization Type [employer]    PT Start Time 0916    PT Stop Time 0954    PT Time Calculation (min) 38 min    Activity Tolerance Patient tolerated treatment well    Behavior During Therapy [facility] for tasks assessed/performed           Past Medical History:  Diagnosis Date  . Abnormal echocardiogram 07/22/2019  . Abnormal EKG 07/22/2019  . Acute deep vein thrombosis (DVT) of popliteal vein of right lower extremity ([place]) 07/01/2019  . Acute pulmonary embolism ([place]) 07/02/2019  . Chest pain 07/22/2019  . Depressed left ventricular ejection fraction 07/22/2019  . DVT (deep venous thrombosis) ([place]) 07/01/2019  . Hypertension   . Lipid screening 07/22/2019  . Obesity (BMI 30-39.9) 07/22/2019  . Osteoporosis 11/18/2019  . Pulmonary embolism ([place]) 07/02/2019  . Single subsegmental pulmonary embolism without acute cor pulmonale ([place]) 07/01/2019    Past Surgical History:  Procedure Laterality Date  . ABDOMINAL HYSTERECTOMY  2010   due to fibroids  . ABDOMINAL HYSTERECTOMY    . BREAST BIOPSY Left   . BREAST CYST EXCISION Left   . [facility]. Late 2000's   . LEFT HEART CATH AND CORONARY ANGIOGRAPHY N/A 08/03/2019   Procedure: LEFT HEART CATH AND CORONARY ANGIOGRAPHY;  Surgeon: Lorretta Harp, MD;  Location: [facility];  Service: Cardiovascular;  Laterality: N/A;  . OOPHORECTOMY      There were no vitals filed for this visit.    Subjective Assessment - 01/30/21 0919    Subjective Patient  reports that LBP had been present for a month before she saw her MD- started a week before Easter. Pain is located over the R LB without radiation. Does note N/T in the big toe. Denies B&B changes. Worse with coughing, getting in/out of the bed, turning in the bed, sitting without back support. Notes trouble with walking quickly. Better with Tylenol. Notes that her pain has "eased up" since initial onset, but not completely gone. Still notes that she is walking a little slowly. Notes no longer having N/T.    Pertinent History PE 2020, osteoporosis, HTN, chest pain    Limitations Sitting;Lifting;Standing;House hold activities    How long can you sit comfortably? unlimited    How long can you stand comfortably? unlimited    How long can you walk comfortably? unlimited    Diagnostic tests 01/12/21 lumbar xray: Moderate anterior endplate spurring  noted at L1-L2. Mild degenerative changes at L4-L5 and L5-S1.    Patient Stated Goals decrease pain    Currently in Pain? 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  No    Is the patient reluctant to leave their home because of a fear of falling?  No      Home Social worker Private residence    Living Arrangements Spouse/significant other    Available Help at Discharge Family    Type of [place] to enter    Entrance Stairs-Number of  Steps 2+2    Entrance Stairs-Rails Left    Home Layout One level   1 step inside-holds onto wall   [place] - 2 wheels;Cane - single point;Toilet riser      Prior Function   Level of Independence Independent    Vocation Retired    Leisure none      Cognition   Overall Cognitive Status Within Functional Limits for tasks assessed      Sensation   Light Touch Appears Intact      Coordination   Gross Motor Movements are Fluid and Coordinated Yes      Posture/Postural Control   Posture/Postural Control Postural limitations    Postural Limitations Rounded Shoulders;Anterior pelvic tilt      ROM / Strength   AROM / PROM / Strength AROM;Strength      AROM   AROM Assessment Site Lumbar    Lumbar Flexion toes    Lumbar Extension mildly limited    Lumbar - Right Side Bend distal thigh    Lumbar - Left Side Bend distal thigh   "pulling"   Lumbar - Right Rotation moderately limited   "tight"   Lumbar - Left Rotation mild-moderately limited   "tight"     Strength   Strength Assessment Site Hip;Knee;Ankle    Right/Left Hip Right;Left    Right Hip Flexion 4+/5    Right Hip ABduction 4+/5    Right Hip ADduction 4+/5    Left Hip Flexion 4/5    Left Hip ABduction 4+/5    Left Hip ADduction 4+/5    Right/Left Knee Right;Left    Right Knee Flexion 4+/5    Right Knee Extension 4+/5    Left Knee Flexion 4/5    Left Knee Extension 4+/5    Right/Left Ankle Left;Right    Right Ankle Dorsiflexion 4+/5    Right Ankle Plantar Flexion 4/5    Left Ankle Dorsiflexion 4+/5    Left Ankle Plantar Flexion 4/5      Flexibility   Soft Tissue Assessment /Muscle Length yes    Hamstrings B WFL    Quadriceps B moderately tight in mod thomas    Piriformis B tight in KTOS and fig 4- limited by guarding      Palpation   Palpation comment TTP in R QL and lumbar paraspinals and piriformis; soft tissue restriction in R QL      Ambulation/Gait   Assistive device None    Gait Pattern  Step-through pattern;Poor foot clearance - left;Poor foot clearance - right   increased B  toe out   Ambulation Surface Level;Indoor    Gait velocity decreased                      Objective measurements completed on examination: See above findings.               PT Education - 01/30/21 1002    Education Details prognosis, POC, HEP    Person(s) Educated Patient    Methods Explanation;Demonstration;Tactile cues;Verbal cues;Handout    Comprehension Verbalized understanding;Returned demonstration            PT Short Term Goals - 01/30/21 1007      PT SHORT TERM GOAL #1   Title Patient to be independent with initial HEP.    Time 3    Period Weeks    Status New    Target Date 02/20/21             PT Long Term Goals - 01/30/21 1007      PT LONG TERM GOAL #1   Title Patient to be independent with advanced HEP.    Time 6    Period Weeks    Status New    Target Date 03/13/21      PT LONG TERM GOAL #2   Title Patient to demonstrate B LE strength >/=4+/5.    Time 6    Period Weeks    Status New    Target Date 03/13/21      PT LONG TERM GOAL #3   Title Patient to demonstrate lumbar AROM WFL and without pain limiting.    Time 6    Period Weeks    Status New    Target Date 03/13/21      PT LONG TERM GOAL #4   Title Patient to demonstrate mild tightness remaining in B hip flexors and piriformis.    Time 6    Period Weeks    Status New    Target Date 03/13/21      PT LONG TERM GOAL #5   Title Patient perform 10 meter walk test in 1.109m/sec or quicker.    Time 6    Period Weeks    Status New    Target Date 03/13/21                  Plan - 01/30/21 1002    Clinical Impression Statement Patient is a 70 y/o F presenting to OPPT with c/o acute R sided LBP for the past 2 months. Initially reported N/T to the R 1st digit, which has since resolved. Denies B&B changes. Still reports pain intermittently and notes decreased gait speed since  initial onset of pain. Patient today presenting with rounded shoulders and anterior pelvic tilt, decreased L hip flexion and knee flexion strength, limited lumbar AROM, decreased B hip flexor and piriformis flexibility, and gait deviations. Patient was educated on gentle stretching and mobility HEP- patient reported understanding. Would benefit from skilled PT services 1x/week for 6 weeks to address aforementioned impairments.    Personal Factors and Comorbidities Age;Comorbidity 3+;Past/Current Experience;Time since onset of injury/illness/exacerbation    Comorbidities PE 2020, osteoporosis, HTN, chest pain    Examination-Activity Limitations Squat;Bend;Caring for Others;Stairs;Carry;Dressing;Lift    Examination-Participation Restrictions Meal Prep;Laundry;Yard Work;Shop;Cleaning;Church    Stability/Clinical Decision Making Stable/Uncomplicated    Clinical Decision Making Low    Rehab Potential Good    PT Frequency 1x / week    PT Duration 6 weeks    PT Treatment/Interventions ADLs/Self Care Home  Management;Cryotherapy;Electrical Stimulation;Moist Heat;Therapeutic exercise;Balance training;Therapeutic activities;Functional mobility training;Stair training;Gait training;Ultrasound;Neuromuscular re-education;Patient/family education;Manual techniques;Taping;Energy conservation;Dry needling;Passive range of motion    PT Next Visit Plan lumbar FOTO; reassess HEP; progress lumbar mobility and LE flexibilty    Consulted and Agree with Plan of Care Patient           Patient will benefit from skilled therapeutic intervention in order to improve the following deficits and impairments:  Abnormal gait,Decreased activity tolerance,Decreased strength,Pain,Increased fascial restricitons,Difficulty walking,Increased muscle spasms,Improper body mechanics,Decreased range of motion,Impaired flexibility,Postural dysfunction  Visit Diagnosis: Acute right-sided low back pain without sciatica  Other symptoms and  signs involving the musculoskeletal system  Other abnormalities of gait and mobility     Problem List Patient Active Problem List   Diagnosis Date Noted  . Cough 01/10/2021  . 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 Hypertension   . Essential hypertension 03/18/2020  . Mixed hyperlipidemia 03/18/2020  . Osteoporosis 11/18/2019  . Chest pain 07/22/2019  . Abnormal echocardiogram 07/22/2019  . Abnormal EKG 07/22/2019  . Lipid screening 07/22/2019  . Obesity (BMI 30-39.9) 07/22/2019  . Depressed left ventricular ejection fraction 07/22/2019  . Acute pulmonary embolism ([place]) 07/02/2019  . Pulmonary embolism ([place]) 07/02/2019  . Single subsegmental pulmonary embolism without acute cor pulmonale ([place]) 07/01/2019  . Acute deep vein thrombosis (DVT) of popliteal vein of right lower extremity ([place]) 07/01/2019  . DVT (deep venous thrombosis) ([place]) 07/01/2019    Janene Harvey, PT, DPT 01/30/21 10:11 AM    [facility] [address]  [place], [place], [address] Phone: [phone]   Fax:  [phone]  Name: Ruth Jensen MRN: [id number] Date of Birth: March 23, 1951

## 2021-02-01 ENCOUNTER — Ambulatory Visit (INDEPENDENT_AMBULATORY_CARE_PROVIDER_SITE_OTHER): Payer: Medicare HMO

## 2021-02-01 ENCOUNTER — Other Ambulatory Visit: Payer: Self-pay

## 2021-02-01 DIAGNOSIS — E538 Deficiency of other specified B group vitamins: Secondary | ICD-10-CM

## 2021-02-01 MED ORDER — CYANOCOBALAMIN 1000 MCG/ML IJ SOLN
1000.0000 ug | INTRAMUSCULAR | Status: DC
  Administered 2021-02-01 – 2021-03-03 (×2): 1000 ug via INTRAMUSCULAR

## 2021-02-01 NOTE — Progress Notes (Signed)
Pt here for monthly B12 injection per [name], NP   B12 1023mcg given left IM, and pt tolerated injection well.  Next B12 injection scheduled for next month

## 2021-02-07 ENCOUNTER — Ambulatory Visit: Payer: Medicare HMO | Admitting: Physical Therapy

## 2021-02-07 ENCOUNTER — Encounter: Payer: Self-pay | Admitting: Physical Therapy

## 2021-02-07 ENCOUNTER — Other Ambulatory Visit: Payer: Self-pay

## 2021-02-07 DIAGNOSIS — R29898 Other symptoms and signs involving the musculoskeletal system: Secondary | ICD-10-CM

## 2021-02-07 DIAGNOSIS — R2689 Other abnormalities of gait and mobility: Secondary | ICD-10-CM

## 2021-02-07 DIAGNOSIS — M545 Low back pain, unspecified: Secondary | ICD-10-CM

## 2021-02-07 NOTE — Therapy (Signed)
[facility] [address]  [place], [place], [address] Phone: [phone]   Fax:  [phone]  Physical Therapy Treatment  Patient Details  Name: Ruth Jensen MRN: [id number] Date of Birth: [birth year] Referring Provider (PT): Lynne Leader, MD   Encounter Date: 02/07/2021   PT End of Session - 02/07/21 1014     Visit Number 2    Number of Visits 7    Date for PT Re-Evaluation 03/13/21    Authorization Type [employer]    Authorization Time Period 6 visits approved 06/14-07/18    Authorization - Visit Number 1    Authorization - Number of Visits 6    PT Start Time 0927    PT Stop Time 1013    PT Time Calculation (min) 46 min    Activity Tolerance Patient tolerated treatment well    Behavior During Therapy [facility] for tasks assessed/performed             Past Medical History:  Diagnosis Date   Abnormal echocardiogram 07/22/2019   Abnormal EKG 07/22/2019   Acute deep vein thrombosis (DVT) of popliteal vein of right lower extremity ([place]) 07/01/2019   Acute pulmonary embolism ([place]) 07/02/2019   Chest pain 07/22/2019   Depressed left ventricular ejection fraction 07/22/2019   DVT (deep venous thrombosis) ([place]) 07/01/2019   Hypertension    Lipid screening 07/22/2019   Obesity (BMI 30-39.9) 07/22/2019   Osteoporosis 11/18/2019   Pulmonary embolism ([place]) 07/02/2019   Single subsegmental pulmonary embolism without acute cor pulmonale ([place]) 07/01/2019    Past Surgical History:  Procedure Laterality Date   ABDOMINAL HYSTERECTOMY  2010   due to fibroids   ABDOMINAL HYSTERECTOMY     BREAST BIOPSY Left    BREAST CYST EXCISION Left    COLONOSCOPY     [facility] Late 2000's    LEFT HEART CATH AND CORONARY ANGIOGRAPHY N/A 08/03/2019   Procedure: LEFT HEART CATH AND CORONARY ANGIOGRAPHY;  Surgeon: Lorretta Harp, MD;  Location: [facility];  Service: Cardiovascular;  Laterality: N/A;   OOPHORECTOMY       There were no vitals filed for this visit.   Subjective Assessment - 02/07/21 0930     Subjective Feeling "way better" than last session. Denies questions/concerns on HEP.    Pertinent History PE 2020, osteoporosis, HTN, chest pain    Diagnostic tests 01/12/21 lumbar xray: Moderate anterior endplate spurring  noted at L1-L2. Mild degenerative changes at L4-L5 and L5-S1.    Patient Stated Goals decrease pain    Currently in Pain? No/denies                [facility] PT Assessment - 02/07/21 0945       Observation/Other Assessments   Focus on Therapeutic Outcomes (FOTO)  lumbar: 28                           [facility] Adult PT Treatment/Exercise - 02/07/21 0001       Exercises   Exercises Lumbar      Lumbar Exercises: Stretches   Hip Flexor Stretch Right;Left;1 rep;30 seconds    Hip Flexor Stretch Limitations mod thomas with strap    Piriformis Stretch Right;Left;2 reps;30 seconds    Piriformis Stretch Limitations KTOS with strap assist    Other Lumbar Stretch Exercise R/L prayer stretch 5x5" with green pball      Lumbar Exercises: Aerobic  Nustep L4 x 6 min (UEs/LEs)      Lumbar Exercises: Supine   Pelvic Tilt 10 reps    Pelvic Tilt Limitations cueing for proper movement pattern    Dead Bug 5 reps    Dead Bug Limitations LEs only   consistent cueing to maintain neutral spine and slight posterior pelvic tilt     Lumbar Exercises: Sidelying   Other Sidelying Lumbar Exercises R/L open book stretch 10x each   cues to encourage end-range motion                   PT Education - 02/07/21 1014     Education Details update to HEP    Person(s) Educated Patient    Methods Explanation;Demonstration;Tactile cues;Verbal cues;Handout    Comprehension Verbalized understanding;Returned demonstration              PT Short Term Goals - 02/07/21 1019       PT SHORT TERM GOAL #1   Title Patient to be independent with initial HEP.    Time 3     Period Weeks    Status On-going    Target Date 02/20/21               PT Long Term Goals - 02/07/21 1019       PT LONG TERM GOAL #1   Title Patient to be independent with advanced HEP.    Time 6    Period Weeks    Status On-going      PT LONG TERM GOAL #2   Title Patient to demonstrate B LE strength >/=4+/5.    Time 6    Period Weeks    Status On-going      PT LONG TERM GOAL #3   Title Patient to demonstrate lumbar AROM WFL and without pain limiting.    Time 6    Period Weeks    Status On-going      PT LONG TERM GOAL #4   Title Patient to demonstrate mild tightness remaining in B hip flexors and piriformis.    Time 6    Period Weeks    Status On-going      PT LONG TERM GOAL #5   Title Patient perform 10 meter walk test in 1.61m/sec or quicker.    Time 6    Period Weeks    Status Achieved   02/07/21 2.53 m/s                  Plan - 02/07/21 1015     Clinical Impression Statement Patient arrived to session with report of feeling "way better" than last session. Denies questions/concerns on HEP. Worked on gentle lumbopelvic and hip stretching while reviewing and providing corrective cues to previously administered HEP. Patient demonstrated limited thoracolumbar rotation ROM with ther-ex, thus provided cues to encourage end-range motion. Encouraged use of strap to achieve deeper stretch with piriformis stretching. Initiated core strengthening, with patient demonstrating challenge in achieving neutral spine; better success after instructing patient on pelvic tilts. Updated HEP with this exercise for continued practice. Patient reported understanding and without complaints at end of session. Patient is progressing well towards goals.    Comorbidities PE 2020, osteoporosis, HTN, chest pain    PT Treatment/Interventions ADLs/Self Care Home Management;Cryotherapy;Electrical Stimulation;Moist Heat;Therapeutic exercise;Balance training;Therapeutic activities;Functional  mobility training;Stair training;Gait training;Ultrasound;Neuromuscular re-education;Patient/family education;Manual techniques;Taping;Energy conservation;Dry needling;Passive range of motion    PT Next Visit Plan reassess HEP; progress lumbar mobility and LE flexibilty    Consulted  and Agree with Plan of Care Patient             Patient will benefit from skilled therapeutic intervention in order to improve the following deficits and impairments:  Abnormal gait, Decreased activity tolerance, Decreased strength, Pain, Increased fascial restricitons, Difficulty walking, Increased muscle spasms, Improper body mechanics, Decreased range of motion, Impaired flexibility, Postural dysfunction  Visit Diagnosis: Acute right-sided low back pain without sciatica  Other symptoms and signs involving the musculoskeletal system  Other abnormalities of gait and mobility     Problem List Patient Active Problem List   Diagnosis Date Noted   Cough 01/10/2021   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   Hypertension    Essential hypertension 03/18/2020   Mixed hyperlipidemia 03/18/2020   Osteoporosis 11/18/2019   Chest pain 07/22/2019   Abnormal echocardiogram 07/22/2019   Abnormal EKG 07/22/2019   Lipid screening 07/22/2019   Obesity (BMI 30-39.9) 07/22/2019   Depressed left ventricular ejection fraction 07/22/2019   Acute pulmonary embolism ([place]) 07/02/2019   Pulmonary embolism ([place]) 07/02/2019   Single subsegmental pulmonary embolism without acute cor pulmonale ([place]) 07/01/2019   Acute deep vein thrombosis (DVT) of popliteal vein of right lower extremity ([place]) 07/01/2019   DVT (deep venous thrombosis) ([place]) 07/01/2019      Janene Harvey, PT, DPT 02/07/21 10:21 AM   [facility] [address]  [place], [place], [address] Phone: [phone]   Fax:  [phone]  Name: Ruth Jensen MRN: [id number] Date of  Birth: 12-24-1950

## 2021-02-21 ENCOUNTER — Ambulatory Visit: Payer: Medicare HMO

## 2021-02-21 ENCOUNTER — Other Ambulatory Visit: Payer: Self-pay

## 2021-02-21 DIAGNOSIS — M545 Low back pain, unspecified: Secondary | ICD-10-CM | POA: Diagnosis not present

## 2021-02-21 DIAGNOSIS — R29898 Other symptoms and signs involving the musculoskeletal system: Secondary | ICD-10-CM | POA: Diagnosis not present

## 2021-02-21 DIAGNOSIS — R2689 Other abnormalities of gait and mobility: Secondary | ICD-10-CM

## 2021-02-21 NOTE — Therapy (Signed)
[facility] [address]  [place], [place], [address] Phone: [phone]   Fax:  [phone]  Physical Therapy Treatment  Patient Details  Name: Ruth Jensen MRN: [id number] Date of Birth: 09-19-1950 Referring Provider (PT): Lynne Leader, MD   Encounter Date: 02/21/2021   PT End of Session - 02/21/21 1014     Visit Number 3    Number of Visits 7    Date for PT Re-Evaluation 03/13/21    Authorization Type [employer]    Authorization Time Period 6 visits approved 06/14-07/18    Authorization - Visit Number 2    Authorization - Number of Visits 6    PT Start Time 0933    PT Stop Time 5916    PT Time Calculation (min) 41 min    Activity Tolerance Patient tolerated treatment well    Behavior During Therapy [facility] for tasks assessed/performed             Past Medical History:  Diagnosis Date   Abnormal echocardiogram 07/22/2019   Abnormal EKG 07/22/2019   Acute deep vein thrombosis (DVT) of popliteal vein of right lower extremity ([place]) 07/01/2019   Acute pulmonary embolism ([place]) 07/02/2019   Chest pain 07/22/2019   Depressed left ventricular ejection fraction 07/22/2019   DVT (deep venous thrombosis) ([place]) 07/01/2019   Hypertension    Lipid screening 07/22/2019   Obesity (BMI 30-39.9) 07/22/2019   Osteoporosis 11/18/2019   Pulmonary embolism ([place]) 07/02/2019   Single subsegmental pulmonary embolism without acute cor pulmonale ([place]) 07/01/2019    Past Surgical History:  Procedure Laterality Date   ABDOMINAL HYSTERECTOMY  2010   due to fibroids   ABDOMINAL HYSTERECTOMY     BREAST BIOPSY Left    BREAST CYST EXCISION Left    COLONOSCOPY     [facility] GI. Late 2000's    LEFT HEART CATH AND CORONARY ANGIOGRAPHY N/A 08/03/2019   Procedure: LEFT HEART CATH AND CORONARY ANGIOGRAPHY;  Surgeon: Lorretta Harp, MD;  Location: [facility];  Service: Cardiovascular;  Laterality: N/A;   OOPHORECTOMY       There were no vitals filed for this visit.   Subjective Assessment - 02/21/21 0941     Subjective Pt reports no pain and not having to use pain medication.    Pertinent History PE 2020, osteoporosis, HTN, chest pain    Diagnostic tests 01/12/21 lumbar xray: Moderate anterior endplate spurring  noted at L1-L2. Mild degenerative changes at L4-L5 and L5-S1.    Patient Stated Goals decrease pain    Currently in Pain? No/denies                               [facility] Adult PT Treatment/Exercise - 02/21/21 0001       Exercises   Exercises Lumbar;Knee/Hip      Lumbar Exercises: Stretches   Hip Flexor Stretch Right;2 reps;30 seconds    Hip Flexor Stretch Limitations mod thomas with strap    Piriformis Stretch Right;Left;30 seconds    Piriformis Stretch Limitations KTOS supine      Lumbar Exercises: Aerobic   Nustep L4 x 6 min (UEs/LEs)      Lumbar Exercises: Supine   Pelvic Tilt 10 reps    Pelvic Tilt Limitations cueing for proper movement pattern   3" holds   Dead Bug 10 reps    Dead Bug Limitations LE only; 2x5 reps  Bridge 10 reps    Bridge Limitations lots of cues for technique      Knee/Hip Exercises: Standing   Hip Abduction Stengthening;Both;10 reps;Knee straight    Abduction Limitations counter support    Hip Extension Stengthening;Both;10 reps;Knee straight    Extension Limitations counter support                      PT Short Term Goals - 02/21/21 1013       PT SHORT TERM GOAL #1   Title Patient to be independent with initial HEP.    Time 3    Period Weeks    Status Partially Met   requires instruction with exercises during session   Target Date 02/20/21               PT Long Term Goals - 02/07/21 1019       PT LONG TERM GOAL #1   Title Patient to be independent with advanced HEP.    Time 6    Period Weeks    Status On-going      PT LONG TERM GOAL #2   Title Patient to demonstrate B LE strength >/=4+/5.     Time 6    Period Weeks    Status On-going      PT LONG TERM GOAL #3   Title Patient to demonstrate lumbar AROM WFL and without pain limiting.    Time 6    Period Weeks    Status On-going      PT LONG TERM GOAL #4   Title Patient to demonstrate mild tightness remaining in B hip flexors and piriformis.    Time 6    Period Weeks    Status On-going      PT LONG TERM GOAL #5   Title Patient perform 10 meter walk test in 1.53msec or quicker.    Time 6    Period Weeks    Status Achieved   02/07/21 2.53 m/s                  Plan - 02/21/21 1027     Clinical Impression Statement Pt required lots of instruction with the exercises. She needs frequent reminders during treatment on the proper way to perform exercises. She also had trouble understanding the the proper form with the exercises, so extra time was taken to ensure her understanding to correctly target the muscles. She reported decreased pain levels today and not having to use pain medication as much lately. She had a tendency to rotate her pelvis with the hip extensions today but showed more isolated movement after being instructed. Deferred HEP update d/t her still requiring cues with exercises but will incorporate strengthening into HEP next visit.    Personal Factors and Comorbidities Age;Comorbidity 3+;Past/Current Experience;Time since onset of injury/illness/exacerbation    Comorbidities PE 2020, osteoporosis, HTN, chest pain    PT Frequency 1x / week    PT Duration 6 weeks    PT Treatment/Interventions ADLs/Self Care Home Management;Cryotherapy;Electrical Stimulation;Moist Heat;Therapeutic exercise;Balance training;Therapeutic activities;Functional mobility training;Stair training;Gait training;Ultrasound;Neuromuscular re-education;Patient/family education;Manual techniques;Taping;Energy conservation;Dry needling;Passive range of motion    PT Next Visit Plan update strengthening HEP; progress lumbar mobility and LE  flexibilty    Consulted and Agree with Plan of Care Patient             Patient will benefit from skilled therapeutic intervention in order to improve the following deficits and impairments:  Abnormal gait, Decreased activity tolerance, Decreased  strength, Pain, Increased fascial restricitons, Difficulty walking, Increased muscle spasms, Improper body mechanics, Decreased range of motion, Impaired flexibility, Postural dysfunction  Visit Diagnosis: Acute right-sided low back pain without sciatica  Other symptoms and signs involving the musculoskeletal system  Other abnormalities of gait and mobility     Problem List Patient Active Problem List   Diagnosis Date Noted   Cough 01/10/2021   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   Hypertension    Essential hypertension 03/18/2020   Mixed hyperlipidemia 03/18/2020   Osteoporosis 11/18/2019   Chest pain 07/22/2019   Abnormal echocardiogram 07/22/2019   Abnormal EKG 07/22/2019   Lipid screening 07/22/2019   Obesity (BMI 30-39.9) 07/22/2019   Depressed left ventricular ejection fraction 07/22/2019   Acute pulmonary embolism ([place]) 07/02/2019   Pulmonary embolism ([place]) 07/02/2019   Single subsegmental pulmonary embolism without acute cor pulmonale ([place]) 07/01/2019   Acute deep vein thrombosis (DVT) of popliteal vein of right lower extremity ([place]) 07/01/2019   DVT (deep venous thrombosis) ([place]) 07/01/2019    Artist Pais, PTA 02/21/2021, 11:59 AM  [facility] [address]  [place], [place], [address] Phone: [phone]   Fax:  [phone]  Name: Ruth Jensen MRN: [id number] Date of Birth: [birth year]

## 2021-02-21 NOTE — Progress Notes (Signed)
Subjective:   Ruth Jensen is a 70 y.o. female who presents for Medicare Annual (Subsequent) preventive examination.  I connected with Tanazia today by telephone and verified that I am speaking with the correct person using two identifiers. Location patient: home Location provider: work Persons participating in the virtual visit: patient, Marine scientist.    I discussed the limitations, risks, security and privacy concerns of performing an evaluation and management service by telephone and the availability of in person appointments. I also discussed with the patient that there may be a patient responsible charge related to this service. The patient expressed understanding and verbally consented to this telephonic visit.    Interactive audio and video telecommunications were attempted between this provider and patient, however failed, due to patient having technical difficulties OR patient did not have access to video capability.  We continued and completed visit with audio only.  Some vital signs may be absent or patient reported.   Time Spent with patient on telephone encounter: 20 minutes   Review of Systems     Cardiac Risk Factors include: advanced age (>57men, >6 women);dyslipidemia;hypertension;obesity (BMI >30kg/m2)     Objective:    Today's Vitals   02/22/21 1021  Weight: 190 lb (86.2 kg)  Height: 5\' 2"  (1.575 m)   Body mass index is 34.75 kg/m.  Advanced Directives 02/22/2021 01/30/2021 01/06/2021 06/16/2020 12/07/2019 10/15/2019 08/03/2019  Does Patient Have a Medical Advance Directive? No Yes No No No No No  Does patient want to make changes to medical advance directive? - No - Patient declined - - - - -  Would patient like information on creating a medical advance directive? Yes (MAU/Ambulatory/Procedural Areas - Information given) - - No - Patient declined No - Patient declined No - Patient declined No - Patient declined    Current Medications (verified) Outpatient Encounter  Medications as of 02/22/2021  Medication Sig   alendronate (FOSAMAX) 70 MG tablet Take 1 tablet (70 mg total) by mouth every 7 (seven) days. Take with a full glass of water on an empty stomach.   apixaban (ELIQUIS) 5 MG TABS tablet Take 1 tablet (5 mg total) by mouth 2 (two) times daily.   Calcium Carbonate-Vitamin D 600-400 MG-UNIT chew tablet Chew 1 tablet by mouth 2 (two) times daily.   metoprolol succinate (TOPROL-XL) 25 MG 24 hr tablet TAKE 1 TABLET(25 MG) BY MOUTH DAILY   Multiple Vitamin (MULTIVITAMIN WITH MINERALS) TABS tablet Take 1 tablet by mouth daily.   pantoprazole (PROTONIX) 20 MG tablet Take 1 tablet (20 mg total) by mouth as directed. Take 20 mg twice daily for 8 weeks, then once daily   rosuvastatin (CRESTOR) 10 MG tablet Take 1 tablet (10 mg total) by mouth daily.   Vitamin D, Ergocalciferol, (DRISDOL) 1.25 MG (50000 UNIT) CAPS capsule Take 1 capsule (50,000 Units total) by mouth every 7 (seven) days. Take for 8 weeks, then OTC Vitamin D 2000 iu daily.   Zoster Vaccine Adjuvanted (SHINGRIX) injection INJECT 0.5MG  INTO THE MUSCLE NOW AND AGAIN IN 2-6 MONTHS.   nitroGLYCERIN (NITROSTAT) 0.4 MG SL tablet Place 1 tablet (0.4 mg total) under the tongue every 5 (five) minutes as needed.   tiZANidine (ZANAFLEX) 4 MG tablet Take 1 tablet (4 mg total) by mouth every 8 (eight) hours as needed for muscle spasms. (Patient not taking: No sig reported)   Facility-Administered Encounter Medications as of 02/22/2021  Medication   cyanocobalamin ((VITAMIN B-12)) injection 1,000 mcg    Allergies (verified) Patient has  no known allergies.   History: Past Medical History:  Diagnosis Date   Abnormal echocardiogram 07/22/2019   Abnormal EKG 07/22/2019   Acute deep vein thrombosis (DVT) of popliteal vein of right lower extremity ([place]) 07/01/2019   Acute pulmonary embolism ([place]) 07/02/2019   Chest pain 07/22/2019   Depressed left ventricular ejection fraction 07/22/2019   DVT (deep venous  thrombosis) ([place]) 07/01/2019   Hypertension    Lipid screening 07/22/2019   Obesity (BMI 30-39.9) 07/22/2019   Osteoporosis 11/18/2019   Pulmonary embolism ([place]) 07/02/2019   Single subsegmental pulmonary embolism without acute cor pulmonale ([place]) 07/01/2019   Past Surgical History:  Procedure Laterality Date   ABDOMINAL HYSTERECTOMY  2010   due to fibroids   ABDOMINAL HYSTERECTOMY     BREAST BIOPSY Left    BREAST CYST EXCISION Left    COLONOSCOPY     [facility]. Late 2000's    LEFT HEART CATH AND CORONARY ANGIOGRAPHY N/A 08/03/2019   Procedure: LEFT HEART CATH AND CORONARY ANGIOGRAPHY;  Surgeon: Lorretta Harp, MD;  Location: [facility];  Service: Cardiovascular;  Laterality: N/A;   OOPHORECTOMY     Family History  Problem Relation Age of Onset   Hypertension Mother    CVA Mother        hemiparesis   Hypertension Father    Dementia Father    Prostate cancer Father    Hypertension Sister    CAD Sister        scheduled for pacemaker   Asthma Paternal Grandfather    Hypertension Sister    Skin cancer Sister        melanoma died at age 27   Hypertension Sister        ? PFO repair   Parkinson's disease Sister    Hypertension Daughter    Hypertension Son        pacemaker   Colon cancer Neg Hx    Esophageal cancer Neg Hx    Social History   Socioeconomic History   Marital status: Married    Spouse name: Truman Hayward   Number of children: 2   Years of education: Not on file   Highest education level: Not on file  Occupational History   Occupation: retired   Tobacco Use   Smoking status: Never   Smokeless tobacco: Never  Vaping Use   Vaping Use: Never used  Substance and Sexual Activity   Alcohol use: Not Currently   Drug use: Never   Sexual activity: Not Currently  Other Topics Concern   Not on file  Social History Narrative   Not on file   Social Determinants of Health   Financial Resource Strain: Low Risk    Difficulty of Paying Living Expenses:  Not hard at all  Food Insecurity: No Food Insecurity   Worried About Charity fundraiser in the Last Year: Never true   [place] in the Last Year: Never true  Transportation Needs: No Transportation Needs   Lack of Transportation (Medical): No   Lack of Transportation (Non-Medical): No  Physical Activity: Inactive   Days of Exercise per Week: 0 days   Minutes of Exercise per Session: 0 min  Stress: No Stress Concern Present   Feeling of Stress : Not at all  Social Connections: Moderately Integrated   Frequency of Communication with Friends and Family: More than three times a week   Frequency of Social Gatherings with Friends and Family: More than three times a  week   Attends Religious Services: More than 4 times per year   Active Member of Clubs or Organizations: No   Attends Archivist Meetings: Never   Marital Status: Married    Tobacco Counseling Counseling given: Not Answered   Clinical Intake:  Pre-visit preparation completed: Yes  Pain : No/denies pain     Nutritional Status: BMI > 30  Obese Nutritional Risks: None Diabetes: No  How often do you need to have someone help you when you read instructions, pamphlets, or other written materials from your doctor or pharmacy?: 1 - Never  Diabetic?No  Interpreter Needed?: No  Information entered by :: Caroleen Hamman LPN   Activities of Daily Living In your present state of health, do you have any difficulty performing the following activities: 02/22/2021  Hearing? N  Vision? N  Difficulty concentrating or making decisions? N  Walking or climbing stairs? N  Dressing or bathing? N  Doing errands, shopping? N  Preparing Food and eating ? N  Using the Toilet? N  In the past six months, have you accidently leaked urine? N  Do you have problems with loss of bowel control? N  Managing your Medications? N  Managing your Finances? N  Housekeeping or managing your Housekeeping? N  Some recent data  might be hidden    Patient Care Team: [name], NP as PCP - General (Internal Medicine) Berniece Salines, DO as PCP - Cardiology (Cardiology)  Indicate any recent Medical Services you may have received from other than [facility] providers in the past year (date may be approximate).     Assessment:   This is a routine wellness examination for Lissete.  Hearing/Vision screen Hearing Screening - Comments:: No issues Vision Screening - Comments:: Reading glasses Last eye exam-several years ago  Dietary issues and exercise activities discussed: Current Exercise Habits: The patient does not participate in regular exercise at present, Exercise limited by: None identified   Goals Addressed             This Visit's Progress    Increase physical activity   Not on track      Depression Screen PHQ 2/9 Scores 02/22/2021 10/15/2019  PHQ - 2 Score 0 0    Fall Risk Fall Risk  02/22/2021 10/15/2019  Falls in the past year? 0 0  Number falls in past yr: 0 0  Injury with Fall? 0 0  Follow up Falls prevention discussed Education provided;Falls prevention discussed    FALL RISK PREVENTION PERTAINING TO THE HOME:  Any stairs in or around the home? No  Home free of loose throw rugs in walkways, pet beds, electrical cords, etc? Yes  Adequate lighting in your home to reduce risk of falls? Yes   ASSISTIVE DEVICES UTILIZED TO PREVENT FALLS:  Life alert? No  Use of a cane, walker or w/c? No  Grab bars in the bathroom? Yes  Shower chair or bench in shower? No  Elevated toilet seat or a handicapped toilet? No   TIMED UP AND GO:  Was the test performed? No . Phone visit   Cognitive Function:Normal cognitive status assessed by this Nurse Health Advisor. No abnormalities found.          Immunizations Immunization History  Administered Date(s) Administered   Fluad Quad(high Dose 65+) 07/14/2019, 05/27/2020   PFIZER(Purple Top)SARS-COV-2 Vaccination 11/07/2019, 11/28/2019, 06/17/2020    Pneumococcal Polysaccharide-23 10/04/2020    TDAP status: Due, Education has been provided regarding the importance of this vaccine. Advised may  receive this vaccine at local pharmacy or Health Dept. Aware to provide a copy of the vaccination record if obtained from local pharmacy or Health Dept. Verbalized acceptance and understanding.  Flu Vaccine status: Up to date  Pneumococcal vaccine status: Up to date  Covid-19 vaccine status: Completed vaccines  Qualifies for Shingles Vaccine? Yes   Zostavax completed No   Shingrix Completed?: No.    Education has been provided regarding the importance of this vaccine. Patient has been advised to call insurance company to determine out of pocket expense if they have not yet received this vaccine. Advised may also receive vaccine at local pharmacy or Health Dept. Verbalized acceptance and understanding.  Screening Tests Health Maintenance  Topic Date Due   Hepatitis C Screening  Never done   TETANUS/TDAP  Never done   Zoster Vaccines- Shingrix (1 of 2) Never done   COVID-19 Vaccine (4 - Booster for Pfizer series) 10/18/2020   INFLUENZA VACCINE  03/27/2021   PNA vac Low Risk Adult (2 of 2 - PCV13) 10/04/2021   MAMMOGRAM  11/16/2021   COLONOSCOPY (Pts 45-9yrs Insurance coverage will need to be confirmed)  01/11/2030   DEXA SCAN  Completed   HPV VACCINES  Aged Out    Health Maintenance  Health Maintenance Due  Topic Date Due   Hepatitis C Screening  Never done   TETANUS/TDAP  Never done   Zoster Vaccines- Shingrix (1 of 2) Never done   COVID-19 Vaccine (4 - Booster for Pfizer series) 10/18/2020    Colorectal cancer screening: No longer required.   Mammogram status: Due-Patient states she will call to schedule.  Bone Density status: Completed 11/17/2019. Results reflect: Bone density results: OSTEOPOROSIS. Repeat every 2 years.  Lung Cancer Screening: (Low Dose CT Chest recommended if Age 62-80 years, 30 pack-year currently smoking  OR have quit w/in 15years.) does not qualify.     Additional Screening:  Hepatitis C Screening: does qualify; Discuss with PCP  Vision Screening: Recommended annual ophthalmology exams for early detection of glaucoma and other disorders of the eye. Is the patient up to date with their annual eye exam?  No  Who is the provider or what is the name of the office in which the patient attends annual eye exams? Patient unsure of name Patient plans to make an appt soon  Dental Screening: Recommended annual dental exams for proper oral hygiene  Community Resource Referral / Chronic Care Management: CRR required this visit?  No   CCM required this visit?  No      Plan:     I have personally reviewed and noted the following in the patient's chart:   Medical and social history Use of alcohol, tobacco or illicit drugs  Current medications and supplements including opioid prescriptions.  Functional ability and status Nutritional status Physical activity Advanced directives List of other physicians Hospitalizations, surgeries, and ER visits in previous 12 months Vitals Screenings to include cognitive, depression, and falls Referrals and appointments  In addition, I have reviewed and discussed with patient certain preventive protocols, quality metrics, and best practice recommendations. A written personalized care plan for preventive services as well as general preventive health recommendations were provided to patient.   Due to this being a telephonic visit, the after visit summary with patients personalized plan was offered to patient via mail or my-chart. Per request, patient was mailed a copy of [place], LPN   7/90/2409  Nurse Health Advisor  Nurse Notes: None

## 2021-02-22 ENCOUNTER — Ambulatory Visit (INDEPENDENT_AMBULATORY_CARE_PROVIDER_SITE_OTHER): Payer: Medicare HMO

## 2021-02-22 VITALS — Ht 62.0 in | Wt 190.0 lb

## 2021-02-22 DIAGNOSIS — Z0000 Encounter for general adult medical examination without abnormal findings: Secondary | ICD-10-CM | POA: Diagnosis not present

## 2021-02-22 NOTE — Progress Notes (Deleted)
    Ruth Jensen is a 70 y.o. female who presents to [facility] today for f/u of chronic R-sided LBP and numbness/tingling into her R great toe.  She was last seen by Dr. Georgina Snell on 01/12/21 and was referred to PT of which she has completed 3 sessions.  She was also prescribed Tizanidine and advised to use a TENs unit.  Since her last visit w/ Dr. Georgina Snell, pt reports   Diagnostic imaging: L-spine XR- 01/12/21  Pertinent review of systems: ***  Relevant historical information: ***   Exam:  There were no vitals taken for this visit. General: Well Developed, well nourished, and in no acute distress.   MSK: ***    Lab and Radiology Results No results found for this or any previous visit (from the past 72 hour(s)). No results found.     Assessment and Plan: 70 y.o. female with ***   PDMP not reviewed this encounter. No orders of the defined types were placed in this encounter.  No orders of the defined types were placed in this encounter.    Discussed warning signs or symptoms. Please see discharge instructions. Patient expresses understanding.   ***

## 2021-02-22 NOTE — Patient Instructions (Signed)
Ruth Jensen , Thank you for taking time to complete your Medicare Wellness Visit. I appreciate your ongoing commitment to your health goals. Please review the following plan we discussed and let me know if I can assist you in the future.   Screening recommendations/referrals: Colonoscopy: Completed 01/12/2020- Not required after age 71. Recommended yearly ophthalmology/optometry visit for glaucoma screening and checkup Recommended yearly dental visit for hygiene and checkup  Vaccinations: Influenza vaccine: Up to date Pneumococcal vaccine: Up to date Tdap vaccine: Discuss with pharmacy Shingles vaccine: Discuss with pharmacy   Covid-19: Up to date  Advanced directives: Information mailed today  Conditions/risks identified: See problem list  Next appointment: Follow up in one year for your annual wellness visit. 02/28/2022 @ 10:20  Preventive Care 65 Years and Older, Female Preventive care refers to lifestyle choices and visits with your health care provider that can promote health and wellness. What does preventive care include? A yearly physical exam. This is also called an annual well check. Dental exams once or twice a year. Routine eye exams. Ask your health care provider how often you should have your eyes checked. Personal lifestyle choices, including: Daily care of your teeth and gums. Regular physical activity. Eating a healthy diet. Avoiding tobacco and drug use. Limiting alcohol use. Practicing safe sex. Taking low doses of aspirin every day. Taking vitamin and mineral supplements as recommended by your health care provider. What happens during an annual well check? The services and screenings done by your health care provider during your annual well check will depend on your age, overall health, lifestyle risk factors, and family history of disease. Counseling  Your health care provider may ask you questions about your: Alcohol use. Tobacco use. Drug use. Emotional  well-being. Home and relationship well-being. Sexual activity. Eating habits. History of falls. Memory and ability to understand (cognition). Work and work Statistician. Screening  You may have the following tests or measurements: Height, weight, and BMI. Blood pressure. Lipid and cholesterol levels. These may be checked every 5 years, or more frequently if you are over 9 years old. Skin check. Lung cancer screening. You may have this screening every year starting at age 75 if you have a 30-pack-year history of smoking and currently smoke or have quit within the past 15 years. Fecal occult blood test (FOBT) of the stool. You may have this test every year starting at age 8. Flexible sigmoidoscopy or colonoscopy. You may have a sigmoidoscopy every 5 years or a colonoscopy every 10 years starting at age 31. Prostate cancer screening. Recommendations will vary depending on your family history and other risks. Hepatitis C blood test. Hepatitis B blood test. Sexually transmitted disease (STD) testing. Diabetes screening. This is done by checking your blood sugar (glucose) after you have not eaten for a while (fasting). You may have this done every 1-3 years. Abdominal aortic aneurysm (AAA) screening. You may need this if you are a current or former smoker. Osteoporosis. You may be screened starting at age 80 if you are at high risk. Talk with your health care provider about your test results, treatment options, and if necessary, the need for more tests. Vaccines  Your health care provider may recommend certain vaccines, such as: Influenza vaccine. This is recommended every year. Tetanus, diphtheria, and acellular pertussis (Tdap, Td) vaccine. You may need a Td booster every 10 years. Zoster vaccine. You may need this after age 42. Pneumococcal 13-valent conjugate (PCV13) vaccine. One dose is recommended after age 82. Pneumococcal polysaccharide (  PPSV23) vaccine. One dose is recommended after  age 16. Talk to your health care provider about which screenings and vaccines you need and how often you need them. This information is not intended to replace advice given to you by your health care provider. Make sure you discuss any questions you have with your health care provider. Document Released: 09/09/2015 Document Revised: 05/02/2016 Document Reviewed: 06/14/2015 Elsevier Interactive Patient Education  2017 [place] Prevention in the Home Falls can cause injuries. They can happen to people of all ages. There are many things you can do to make your home safe and to help prevent falls. What can I do on the outside of my home? Regularly fix the edges of walkways and driveways and fix any cracks. Remove anything that might make you trip as you walk through a door, such as a raised step or threshold. Trim any bushes or trees on the path to your home. Use bright outdoor lighting. Clear any walking paths of anything that might make someone trip, such as rocks or tools. Regularly check to see if handrails are loose or broken. Make sure that both sides of any steps have handrails. Any raised decks and porches should have guardrails on the edges. Have any leaves, snow, or ice cleared regularly. Use sand or salt on walking paths during winter. Clean up any spills in your garage right away. This includes oil or grease spills. What can I do in the bathroom? Use night lights. Install grab bars by the toilet and in the tub and shower. Do not use towel bars as grab bars. Use non-skid mats or decals in the tub or shower. If you need to sit down in the shower, use a plastic, non-slip stool. Keep the floor dry. Clean up any water that spills on the floor as soon as it happens. Remove soap buildup in the tub or shower regularly. Attach bath mats securely with double-sided non-slip rug tape. Do not have throw rugs and other things on the floor that can make you trip. What can I do in the  bedroom? Use night lights. Make sure that you have a light by your bed that is easy to reach. Do not use any sheets or blankets that are too big for your bed. They should not hang down onto the floor. Have a firm chair that has side arms. You can use this for support while you get dressed. Do not have throw rugs and other things on the floor that can make you trip. What can I do in the kitchen? Clean up any spills right away. Avoid walking on wet floors. Keep items that you use a lot in easy-to-reach places. If you need to reach something above you, use a strong step stool that has a grab bar. Keep electrical cords out of the way. Do not use floor polish or wax that makes floors slippery. If you must use wax, use non-skid floor wax. Do not have throw rugs and other things on the floor that can make you trip. What can I do with my stairs? Do not leave any items on the stairs. Make sure that there are handrails on both sides of the stairs and use them. Fix handrails that are broken or loose. Make sure that handrails are as long as the stairways. Check any carpeting to make sure that it is firmly attached to the stairs. Fix any carpet that is loose or worn. Avoid having throw rugs at the top or  bottom of the stairs. If you do have throw rugs, attach them to the floor with carpet tape. Make sure that you have a light switch at the top of the stairs and the bottom of the stairs. If you do not have them, ask someone to add them for you. What else can I do to help prevent falls? Wear shoes that: Do not have high heels. Have rubber bottoms. Are comfortable and fit you well. Are closed at the toe. Do not wear sandals. If you use a stepladder: Make sure that it is fully opened. Do not climb a closed stepladder. Make sure that both sides of the stepladder are locked into place. Ask someone to hold it for you, if possible. Clearly mark and make sure that you can see: Any grab bars or  handrails. First and last steps. Where the edge of each step is. Use tools that help you move around (mobility aids) if they are needed. These include: Canes. Walkers. Scooters. Crutches. Turn on the lights when you go into a dark area. Replace any light bulbs as soon as they burn out. Set up your furniture so you have a clear path. Avoid moving your furniture around. If any of your floors are uneven, fix them. If there are any pets around you, be aware of where they are. Review your medicines with your doctor. Some medicines can make you feel dizzy. This can increase your chance of falling. Ask your doctor what other things that you can do to help prevent falls. This information is not intended to replace advice given to you by your health care provider. Make sure you discuss any questions you have with your health care provider. Document Released: 06/09/2009 Document Revised: 01/19/2016 Document Reviewed: 09/17/2014 Elsevier Interactive Patient Education  2017 [employer].

## 2021-02-23 ENCOUNTER — Ambulatory Visit: Payer: Medicare HMO | Admitting: Family Medicine

## 2021-02-28 ENCOUNTER — Encounter: Payer: Self-pay | Admitting: Physical Therapy

## 2021-02-28 ENCOUNTER — Other Ambulatory Visit: Payer: Self-pay

## 2021-02-28 ENCOUNTER — Ambulatory Visit: Payer: Medicare HMO | Attending: Family Medicine | Admitting: Physical Therapy

## 2021-02-28 DIAGNOSIS — M545 Low back pain, unspecified: Secondary | ICD-10-CM | POA: Diagnosis not present

## 2021-02-28 DIAGNOSIS — R2689 Other abnormalities of gait and mobility: Secondary | ICD-10-CM | POA: Diagnosis not present

## 2021-02-28 DIAGNOSIS — R29898 Other symptoms and signs involving the musculoskeletal system: Secondary | ICD-10-CM | POA: Insufficient documentation
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
## 2021-03-03 ENCOUNTER — Ambulatory Visit (INDEPENDENT_AMBULATORY_CARE_PROVIDER_SITE_OTHER): Payer: Medicare HMO

## 2021-03-03 ENCOUNTER — Other Ambulatory Visit: Payer: Self-pay

## 2021-03-03 DIAGNOSIS — E538 Deficiency of other specified B group vitamins: Secondary | ICD-10-CM | POA: Diagnosis not present

## 2021-03-03 MED ORDER — CYANOCOBALAMIN 1000 MCG/ML IJ SOLN: 1000.0000 ug | Freq: Once | INTRAMUSCULAR | Status: DC

## 2021-03-03 NOTE — Progress Notes (Signed)
Ruth Jensen is a 70 y.o. female presents to the office today for monthly b12 injection, per physician's orders. Original order: 11/17/202 cyanocobalamin (med), 1,000 mcg (dose),  IM (route) was administered right deltoid (location) today. Patient tolerated injection.  Patient next injection due: one month, appt made Yes  Loleta Chance

## 2021-03-07 ENCOUNTER — Ambulatory Visit: Payer: Medicare HMO | Admitting: Rehabilitative and Restorative Service Providers"

## 2021-03-13 ENCOUNTER — Encounter: Payer: Medicare HMO | Admitting: Physical Therapy

## 2021-03-15 ENCOUNTER — Other Ambulatory Visit: Payer: Self-pay | Admitting: Family

## 2021-03-15 NOTE — Progress Notes (Deleted)
   I, Ruth Jensen, LAT, ATC, am serving as scribe for Dr. Lynne Leader.  Ruth Jensen is a 70 y.o. female who presents to [place] at [facility] today for f/u of LBP and R great toe numbness/tingling.  She was last seen by Dr. Georgina Snell on 01/12/21 and was referred to PT of which she has completed 4 sessions and has been d/c.  She was prescribed Tizanidine prn for use at bedtime.  Since her last visit w/ Dr. Georgina Snell, pt reports   Diagnostic imaging: L-spine XR- 01/12/21  Pertinent review of systems: ***  Relevant historical information: ***   Exam:  There were no vitals taken for this visit. General: Well Developed, well nourished, and in no acute distress.   MSK: ***    Lab and Radiology Results No results found for this or any previous visit (from the past 72 hour(s)). No results found.     Assessment and Plan: 70 y.o. female with ***   PDMP not reviewed this encounter. No orders of the defined types were placed in this encounter.  No orders of the defined types were placed in this encounter.    Discussed warning signs or symptoms. Please see discharge instructions. Patient expresses understanding.   ***

## 2021-03-16 ENCOUNTER — Ambulatory Visit: Payer: Medicare HMO | Admitting: Family Medicine

## 2021-03-27 ENCOUNTER — Other Ambulatory Visit: Payer: Self-pay | Admitting: Family

## 2021-04-03 ENCOUNTER — Ambulatory Visit: Payer: Medicare HMO | Admitting: Family

## 2021-04-05 ENCOUNTER — Ambulatory Visit: Payer: Medicare HMO | Admitting: Family Medicine

## 2021-04-05 ENCOUNTER — Ambulatory Visit: Payer: Medicare HMO | Admitting: Family

## 2021-04-05 NOTE — Progress Notes (Deleted)
   I, Wendy Poet, LAT, ATC, am serving as scribe for Dr. Lynne Leader.  Ruth Jensen is a 70 y.o. female who presents to [place] [facility] today for f/u of LBP.  She was last seen by Dr. Georgina Snell on 01/12/21 and was referred to PT of which she has completed 4 sessions and has been d/c.  She was also prescribed Tizanidine.  Since her last visit, pt reports   Diagnostic testing: L-spine XR- 01/12/21  Pertinent review of systems: ***  Relevant historical information: ***   Exam:  There were no vitals taken for this visit. General: Well Developed, well nourished, and in no acute distress.   MSK: ***    Lab and Radiology Results No results found for this or any previous visit (from the past 72 hour(s)). No results found.     Assessment and Plan: 70 y.o. female with ***   PDMP not reviewed this encounter. No orders of the defined types were placed in this encounter.  No orders of the defined types were placed in this encounter.    Discussed warning signs or symptoms. Please see discharge instructions. Patient expresses understanding.   ***

## 2021-04-05 NOTE — Progress Notes (Incomplete)
Subjective:   By signing my name below, I, Zite Okoli, attest that this documentation has been prepared under the direction and in the presence of [name], NP 04/05/2021   Patient ID: Ruth Jensen, female    DOB: 12/01/1950, 70 y.o.   MRN: [id number]  No chief complaint on file.   HPI Patient is in today for an office visit  Hypertension-  Past Medical History:  Diagnosis Date   Abnormal echocardiogram 07/22/2019   Abnormal EKG 07/22/2019   Acute deep vein thrombosis (DVT) of popliteal vein of right lower extremity ([place]) 07/01/2019   Acute pulmonary embolism ([place]) 07/02/2019   Chest pain 07/22/2019   Depressed left ventricular ejection fraction 07/22/2019   DVT (deep venous thrombosis) ([place]) 07/01/2019   Hypertension    Lipid screening 07/22/2019   Obesity (BMI 30-39.9) 07/22/2019   Osteoporosis 11/18/2019   Pulmonary embolism ([place]) 07/02/2019   Single subsegmental pulmonary embolism without acute cor pulmonale ([place]) 07/01/2019    Past Surgical History:  Procedure Laterality Date   ABDOMINAL HYSTERECTOMY  2010   due to fibroids   ABDOMINAL HYSTERECTOMY     BREAST BIOPSY Left    BREAST CYST EXCISION Left    COLONOSCOPY     [facility]. Late 2000's    LEFT HEART CATH AND CORONARY ANGIOGRAPHY N/A 08/03/2019   Procedure: LEFT HEART CATH AND CORONARY ANGIOGRAPHY;  Surgeon: Lorretta Harp, MD;  Location: [facility];  Service: Cardiovascular;  Laterality: N/A;   OOPHORECTOMY      Family History  Problem Relation Age of Onset   Hypertension Mother    CVA Mother        hemiparesis   Hypertension Father    Dementia Father    Prostate cancer Father    Hypertension Sister    CAD Sister        scheduled for pacemaker   Asthma Paternal Grandfather    Hypertension Sister    Skin cancer Sister        melanoma died at age 76   Hypertension Sister        ? PFO repair   Parkinson's disease Sister    Hypertension Daughter    Hypertension Son         pacemaker   Colon cancer Neg Hx    Esophageal cancer Neg Hx     Social History   Socioeconomic History   Marital status: Married    Spouse name: Truman Hayward   Number of children: 2   Years of education: Not on file   Highest education level: Not on file  Occupational History   Occupation: retired   Tobacco Use   Smoking status: Never   Smokeless tobacco: Never  Vaping Use   Vaping Use: Never used  Substance and Sexual Activity   Alcohol use: Not Currently   Drug use: Never   Sexual activity: Not Currently  Other Topics Concern   Not on file  Social History Narrative   Not on file   Social Determinants of Health   Financial Resource Strain: Low Risk    Difficulty of Paying Living Expenses: Not hard at all  Food Insecurity: No Food Insecurity   Worried About Charity fundraiser in the Last Year: Never true   [place] in the Last Year: Never true  Transportation Needs: No Transportation Needs   Lack of Transportation (Medical): No   Lack of Transportation (Non-Medical): No  Physical Activity: Inactive  Days of Exercise per Week: 0 days   Minutes of Exercise per Session: 0 min  Stress: No Stress Concern Present   Feeling of Stress : Not at all  Social Connections: Moderately Integrated   Frequency of Communication with Friends and Family: More than three times a week   Frequency of Social Gatherings with Friends and Family: More than three times a week   Attends Religious Services: More than 4 times per year   Active Member of [employer] or Organizations: No   Attends Archivist Meetings: Never   Marital Status: Married  Human resources officer Violence: Not At Risk   Fear of Current or Ex-Partner: No   Emotionally Abused: No   Physically Abused: No   Sexually Abused: No    Outpatient Medications Prior to Visit  Medication Sig Dispense Refill   alendronate (FOSAMAX) 70 MG tablet TAKE 1 TABLET(70 MG) BY MOUTH EVERY 7 DAYS WITH A FULL GLASS OF WATER AND ON AN EMPTY  STOMACH 4 tablet 11   apixaban (ELIQUIS) 5 MG TABS tablet Take 1 tablet (5 mg total) by mouth 2 (two) times daily. 60 tablet 2   Calcium Carbonate-Vitamin D 600-400 MG-UNIT chew tablet Chew 1 tablet by mouth 2 (two) times daily.     lisinopril (ZESTRIL) 2.5 MG tablet TAKE 1 TABLET(2.5 MG) BY MOUTH DAILY 90 tablet 1   metoprolol succinate (TOPROL-XL) 25 MG 24 hr tablet TAKE 1 TABLET(25 MG) BY MOUTH DAILY 90 tablet 1   Multiple Vitamin (MULTIVITAMIN WITH MINERALS) TABS tablet Take 1 tablet by mouth daily. 30 tablet 0   nitroGLYCERIN (NITROSTAT) 0.4 MG SL tablet Place 1 tablet (0.4 mg total) under the tongue every 5 (five) minutes as needed. 30 tablet 3   pantoprazole (PROTONIX) 20 MG tablet Take 1 tablet (20 mg total) by mouth as directed. Take 20 mg twice daily for 8 weeks, then once daily 90 tablet 1   rosuvastatin (CRESTOR) 10 MG tablet Take 1 tablet (10 mg total) by mouth daily. 30 tablet 3   tiZANidine (ZANAFLEX) 4 MG tablet Take 1 tablet (4 mg total) by mouth every 8 (eight) hours as needed for muscle spasms. (Patient not taking: No sig reported) 30 tablet 1   Vitamin D, Ergocalciferol, (DRISDOL) 1.25 MG (50000 UNIT) CAPS capsule Take 1 capsule (50,000 Units total) by mouth every 7 (seven) days. Take for 8 weeks, then OTC Vitamin D 2000 iu daily. 8 capsule 0   Zoster Vaccine Adjuvanted (SHINGRIX) injection INJECT 0.'5MG'$  INTO THE MUSCLE NOW AND AGAIN IN 2-6 MONTHS. 1 each 1   Facility-Administered Medications Prior to Visit  Medication Dose Route Frequency Provider Last Rate Last Admin   cyanocobalamin ((VITAMIN B-12)) injection 1,000 mcg  1,000 mcg Intramuscular Q30 days [name], NP   1,000 mcg at 03/03/21 1040   cyanocobalamin ((VITAMIN B-12)) injection 1,000 mcg  1,000 mcg Intramuscular Once [name], NP        No Known Allergies  ROS     Objective:    Physical Exam Constitutional:      General: She is not in acute distress.    Appearance: Normal appearance.  She is not ill-appearing.  HENT:     Head: Normocephalic and atraumatic.     Right Ear: External ear normal.     Left Ear: External ear normal.  Eyes:     Extraocular Movements: Extraocular movements intact.     Pupils: Pupils are equal, round, and reactive to light.  Cardiovascular:  Rate and Rhythm: Normal rate and regular rhythm.     Pulses: Normal pulses.     Heart sounds: Normal heart sounds. No murmur heard. Pulmonary:     Effort: Pulmonary effort is normal. No respiratory distress.     Breath sounds: Normal breath sounds. No wheezing or rhonchi.  Abdominal:     General: Bowel sounds are normal. There is no distension.     Palpations: Abdomen is soft.     Tenderness: There is no abdominal tenderness. There is no guarding or rebound.  Musculoskeletal:     Cervical back: Neck supple.  Lymphadenopathy:     Cervical: No cervical adenopathy.  Skin:    General: Skin is warm and dry.  Neurological:     Mental Status: She is alert and oriented to person, place, and time.  Psychiatric:        Behavior: Behavior normal.        Judgment: Judgment normal.    There were no vitals taken for this visit. Wt Readings from Last 3 Encounters:  02/22/21 190 lb (86.2 kg)  01/12/21 190 lb 12.8 oz (86.5 kg)  01/06/21 187 lb (84.8 kg)    Diabetic Foot Exam - Simple   No data filed    Lab Results  Component Value Date   WBC 7.5 10/04/2020   HGB 10.8 (L) 10/04/2020   HCT 33.7 (L) 10/04/2020   PLT 292.0 10/04/2020   GLUCOSE 75 10/04/2020   CHOL 156 12/16/2019   TRIG 68.0 12/16/2019   HDL 50.70 12/16/2019   LDLCALC 92 12/16/2019   ALT 8 06/16/2020   AST 13 (L) 06/16/2020   NA 142 10/04/2020   K 4.0 10/04/2020   CL 107 10/04/2020   CREATININE 0.57 10/04/2020   BUN 12 10/04/2020   CO2 31 10/04/2020   HGBA1C 5.5 07/03/2019    No results found for: TSH Lab Results  Component Value Date   WBC 7.5 10/04/2020   HGB 10.8 (L) 10/04/2020   HCT 33.7 (L) 10/04/2020   MCV 76.7  (L) 10/04/2020   PLT 292.0 10/04/2020   Lab Results  Component Value Date   NA 142 10/04/2020   K 4.0 10/04/2020   CO2 31 10/04/2020   GLUCOSE 75 10/04/2020   BUN 12 10/04/2020   CREATININE 0.57 10/04/2020   BILITOT 0.6 06/16/2020   ALKPHOS 82 06/16/2020   AST 13 (L) 06/16/2020   ALT 8 06/16/2020   PROT 7.6 06/16/2020   ALBUMIN 3.9 06/16/2020   CALCIUM 9.3 10/04/2020   ANIONGAP 7 06/16/2020   GFR 91.82 10/04/2020   Lab Results  Component Value Date   CHOL 156 12/16/2019   Lab Results  Component Value Date   HDL 50.70 12/16/2019   Lab Results  Component Value Date   LDLCALC 92 12/16/2019   Lab Results  Component Value Date   TRIG 68.0 12/16/2019   Lab Results  Component Value Date   CHOLHDL 3 12/16/2019   Lab Results  Component Value Date   HGBA1C 5.5 07/03/2019       Assessment & Plan:   Problem List Items Addressed This Visit   None   No orders of the defined types were placed in this encounter.   I,Zite Okoli,acting as a Education administrator for [employer], NP.,have documented all relevant documentation on the behalf of Nance Pear, NP,as directed by  Nance Pear, NP while in the presence of Nance Pear, NP.   I, [name], NP, personally  preformed the services described in this documentation.  All medical record entries made by the scribe were at my direction and in my presence.  I have reviewed the chart and discharge instructions (if applicable) and agree that the record reflects my personal performance and is accurate and complete. 04/05/2021

## 2021-04-14 ENCOUNTER — Telehealth: Payer: Self-pay | Admitting: Family

## 2021-04-14 ENCOUNTER — Encounter: Payer: Self-pay | Admitting: Family

## 2021-04-14 ENCOUNTER — Other Ambulatory Visit: Payer: Self-pay

## 2021-04-14 ENCOUNTER — Ambulatory Visit (INDEPENDENT_AMBULATORY_CARE_PROVIDER_SITE_OTHER): Payer: Medicare HMO | Admitting: Family

## 2021-04-14 VITALS — BP 136/77 | HR 84 | Temp 98.5°F | Resp 16 | Wt 193.8 lb

## 2021-04-14 DIAGNOSIS — I10 Essential (primary) hypertension: Secondary | ICD-10-CM

## 2021-04-14 DIAGNOSIS — E538 Deficiency of other specified B group vitamins: Secondary | ICD-10-CM | POA: Diagnosis not present

## 2021-04-14 DIAGNOSIS — Z1159 Encounter for screening for other viral diseases: Secondary | ICD-10-CM

## 2021-04-14 DIAGNOSIS — M810 Age-related osteoporosis without current pathological fracture: Secondary | ICD-10-CM | POA: Diagnosis not present

## 2021-04-14 DIAGNOSIS — K219 Gastro-esophageal reflux disease without esophagitis: Secondary | ICD-10-CM

## 2021-04-14 DIAGNOSIS — E782 Mixed hyperlipidemia: Secondary | ICD-10-CM | POA: Diagnosis not present

## 2021-04-14 DIAGNOSIS — E559 Vitamin D deficiency, unspecified: Secondary | ICD-10-CM

## 2021-04-14 DIAGNOSIS — Z86711 Personal history of pulmonary embolism: Secondary | ICD-10-CM

## 2021-04-14 DIAGNOSIS — Z1231 Encounter for screening mammogram for malignant neoplasm of breast: Secondary | ICD-10-CM | POA: Diagnosis not present

## 2021-04-14 MED ORDER — CYANOCOBALAMIN 1000 MCG/ML IJ SOLN
1000.0000 ug | Freq: Once | INTRAMUSCULAR | Status: AC
  Administered 2021-04-14: 1000 ug via INTRAMUSCULAR

## 2021-04-14 NOTE — Progress Notes (Signed)
Subjective:   By signing my name below, I, Shehryar Baig, attest that this documentation has been prepared under the direction and in the presence of [name] NP. 04/14/2021    Patient ID: Ruth Jensen, female    DOB: [birth year], 69 y.o.   MRN: [id number]  No chief complaint on file.   HPI Patient is in today for a office visit.  Blood pressure- Her blood pressure is doing well during this visit. She continues taking 2.5 mg lisinopril daily PO, 25 mg metoprolol succinate daily PO and reports no new issues while taking them.   BP Readings from Last 3 Encounters:  04/14/21 136/77  01/12/21 (!) 142/88  01/06/21 (!) 137/99   Cholesterol- Her last cholesterol panel was good. She continues taking 10 mg Crestor daily PO and reports no new issues while taking it.   Lab Results  Component Value Date   CHOL 156 12/16/2019   HDL 50.70 12/16/2019   LDLCALC 92 12/16/2019   TRIG 68.0 12/16/2019   CHOLHDL 3 12/16/2019   Bone health- She continues taking fosamax  to manage her bone health and reports no new issues while taking it.  Blood clot- She no longer takes 5 mg eliquis daily PO due to he cost of the medication being too expensive. She reports no new symptoms while not taking it.  Reflux- She not longer takes 20 mg Protonix daily PO to manage her reflux. Her other provider took her off the medication and she reports no new issues while off of it.  Muscle relaxer- She no longer takes 4 mg Zanaflex 3x daily PRN and reports no new issues while not taking it.  Immunizations- She did not take the shingles vaccine due to high costs. She has 3 Covid-19 vaccines at this time and is willing to get the new booster vaccine after it releases in the fall season. She is due for the tetanus vaccine and is willing to receive it at her pharmacy at a later time. She willing to get a hepatitis C screening during her next lab work.  Mammogram- She is due for a mammogram and is planning to schedule  an appointment after this visit.    Health Maintenance Due  Topic Date Due   Hepatitis C Screening  Never done   TETANUS/TDAP  Never done   COVID-19 Vaccine (4 - Booster for Pfizer series) 10/18/2020   INFLUENZA VACCINE  03/27/2021    Past Medical History:  Diagnosis Date   Abnormal echocardiogram 07/22/2019   Abnormal EKG 07/22/2019   Acute deep vein thrombosis (DVT) of popliteal vein of right lower extremity ([place]) 07/01/2019   Acute pulmonary embolism ([place]) 07/02/2019   Chest pain 07/22/2019   Depressed left ventricular ejection fraction 07/22/2019   DVT (deep venous thrombosis) ([place]) 07/01/2019   History of DVT (deep vein thrombosis) 07/01/2019   History of pulmonary embolism 07/02/2019   Hypertension    Lipid screening 07/22/2019   Obesity (BMI 30-39.9) 07/22/2019   Osteoporosis 11/18/2019   Pulmonary embolism ([place]) 07/02/2019   Single subsegmental pulmonary embolism without acute cor pulmonale ([place]) 07/01/2019    Past Surgical History:  Procedure Laterality Date   ABDOMINAL HYSTERECTOMY  2010   due to fibroids   ABDOMINAL HYSTERECTOMY     BREAST BIOPSY Left    BREAST CYST EXCISION Left    COLONOSCOPY     [facility]. Late 2000's    LEFT HEART CATH AND CORONARY ANGIOGRAPHY N/A 08/03/2019   Procedure: LEFT  HEART CATH AND CORONARY ANGIOGRAPHY;  Surgeon: Lorretta Harp, MD;  Location: [facility];  Service: Cardiovascular;  Laterality: N/A;   OOPHORECTOMY      Family History  Problem Relation Age of Onset   Hypertension Mother    CVA Mother        hemiparesis   Hypertension Father    Dementia Father    Prostate cancer Father    Hypertension Sister    CAD Sister        scheduled for pacemaker   Asthma Paternal Grandfather    Hypertension Sister    Skin cancer Sister        melanoma died at age 70   Hypertension Sister        ? PFO repair   Parkinson's disease Sister    Hypertension Daughter    Hypertension Son        pacemaker   Colon cancer Neg  Hx    Esophageal cancer Neg Hx     Social History   Socioeconomic History   Marital status: Married    Spouse name: Truman Hayward   Number of children: 2   Years of education: Not on file   Highest education level: Not on file  Occupational History   Occupation: retired   Tobacco Use   Smoking status: Never   Smokeless tobacco: Never  Vaping Use   Vaping Use: Never used  Substance and Sexual Activity   Alcohol use: Not Currently   Drug use: Never   Sexual activity: Not Currently  Other Topics Concern   Not on file  Social History Narrative   Not on file   Social Determinants of Health   Financial Resource Strain: Low Risk    Difficulty of Paying Living Expenses: Not hard at all  Food Insecurity: No Food Insecurity   Worried About Charity fundraiser in the Last Year: Never true   [place] in the Last Year: Never true  Transportation Needs: No Transportation Needs   Lack of Transportation (Medical): No   Lack of Transportation (Non-Medical): No  Physical Activity: Inactive   Days of Exercise per Week: 0 days   Minutes of Exercise per Session: 0 min  Stress: No Stress Concern Present   Feeling of Stress : Not at all  Social Connections: Moderately Integrated   Frequency of Communication with Friends and Family: More than three times a week   Frequency of Social Gatherings with Friends and Family: More than three times a week   Attends Religious Services: More than 4 times per year   Active Member of [employer] or Organizations: No   Attends Archivist Meetings: Never   Marital Status: Married  Human resources officer Violence: Not At Risk   Fear of Current or Ex-Partner: No   Emotionally Abused: No   Physically Abused: No   Sexually Abused: No    Outpatient Medications Prior to Visit  Medication Sig Dispense Refill   alendronate (FOSAMAX) 70 MG tablet TAKE 1 TABLET(70 MG) BY MOUTH EVERY 7 DAYS WITH A FULL GLASS OF WATER AND ON AN EMPTY STOMACH 4 tablet 11    Calcium Carbonate-Vitamin D 600-400 MG-UNIT chew tablet Chew 1 tablet by mouth 2 (two) times daily.     lisinopril (ZESTRIL) 2.5 MG tablet TAKE 1 TABLET(2.5 MG) BY MOUTH DAILY 90 tablet 1   metoprolol succinate (TOPROL-XL) 25 MG 24 hr tablet TAKE 1 TABLET(25 MG) BY MOUTH DAILY 90 tablet 1   Multiple  Vitamin (MULTIVITAMIN WITH MINERALS) TABS tablet Take 1 tablet by mouth daily. 30 tablet 0   nitroGLYCERIN (NITROSTAT) 0.4 MG SL tablet Place 1 tablet (0.4 mg total) under the tongue every 5 (five) minutes as needed. 30 tablet 3   rosuvastatin (CRESTOR) 10 MG tablet Take 1 tablet (10 mg total) by mouth daily. 30 tablet 3   Vitamin D, Ergocalciferol, (DRISDOL) 1.25 MG (50000 UNIT) CAPS capsule Take 1 capsule (50,000 Units total) by mouth every 7 (seven) days. Take for 8 weeks, then OTC Vitamin D 2000 iu daily. 8 capsule 0   Zoster Vaccine Adjuvanted (SHINGRIX) injection INJECT 0.5MG INTO THE MUSCLE NOW AND AGAIN IN 2-6 MONTHS. 1 each 1   apixaban (ELIQUIS) 5 MG TABS tablet Take 1 tablet (5 mg total) by mouth 2 (two) times daily. 60 tablet 2   pantoprazole (PROTONIX) 20 MG tablet Take 1 tablet (20 mg total) by mouth as directed. Take 20 mg twice daily for 8 weeks, then once daily 90 tablet 1   tiZANidine (ZANAFLEX) 4 MG tablet Take 1 tablet (4 mg total) by mouth every 8 (eight) hours as needed for muscle spasms. (Patient not taking: No sig reported) 30 tablet 1   Facility-Administered Medications Prior to Visit  Medication Dose Route Frequency Provider Last Rate Last Admin   cyanocobalamin ((VITAMIN B-12)) injection 1,000 mcg  1,000 mcg Intramuscular Q30 days [name], NP   1,000 mcg at 03/03/21 1040   cyanocobalamin ((VITAMIN B-12)) injection 1,000 mcg  1,000 mcg Intramuscular Once [name], NP        No Known Allergies  ROS See HPI    Objective:    Physical Exam Constitutional:      General: She is not in acute distress.    Appearance: Normal appearance. She is not  ill-appearing.  HENT:     Head: Normocephalic and atraumatic.     Right Ear: External ear normal.     Left Ear: External ear normal.  Eyes:     Extraocular Movements: Extraocular movements intact.     Pupils: Pupils are equal, round, and reactive to light.  Cardiovascular:     Rate and Rhythm: Normal rate and regular rhythm.     Heart sounds: Normal heart sounds. No murmur heard.   No gallop.  Pulmonary:     Effort: Pulmonary effort is normal. No respiratory distress.     Breath sounds: Normal breath sounds. No wheezing or rales.  Skin:    General: Skin is warm and dry.  Neurological:     Mental Status: She is alert and oriented to person, place, and time.  Psychiatric:        Behavior: Behavior normal.    BP 136/77 (BP Location: Right Arm, Patient Position: Sitting, Cuff Size: Small)   Pulse 84   Temp 98.5 F (36.9 C) (Oral)   Resp 16   Wt 193 lb 12.8 oz (87.9 kg)   SpO2 100%   BMI 35.45 kg/m  Wt Readings from Last 3 Encounters:  04/14/21 193 lb 12.8 oz (87.9 kg)  02/22/21 190 lb (86.2 kg)  01/12/21 190 lb 12.8 oz (86.5 kg)       Assessment & Plan:   Problem List Items Addressed This Visit       Unprioritized   Osteoporosis    Continue fosamax. Bone density is up to date.       Mixed hyperlipidemia    Tolerating statin, obtain follow up lipid panel.  Relevant Orders   Lipid panel   History of pulmonary embolism    Pt could not afford Eliquis. She therefore stopped and changed to aspirin 81 mg. I have filled out pt assistance forms for Eliquis today for pt to complete and submit. In the meantime, I will reach out to her hematology team to see if they would prefer whole dose aspirin or coumadin.        GERD (gastroesophageal reflux disease)    Stable off of protonix. Monitor.       Essential hypertension    bp is stable. Continue toprol xl 81m and lisinopril 2.523m       Relevant Orders   Comp Met (CMET)   Other Visit Diagnoses     Need for  hepatitis C screening test    -  Primary   Relevant Orders   Hepatitis C Antibody   Vitamin D deficiency       Relevant Orders   Vitamin D (25 hydroxy)   Encounter for screening mammogram for malignant neoplasm of breast       Relevant Orders   MM 3D SCREEN BREAST BILATERAL   B12 deficiency       Relevant Medications   cyanocobalamin ((VITAMIN B-12)) injection 1,000 mcg (Completed)        Meds ordered this encounter  Medications   cyanocobalamin ((VITAMIN B-12)) injection 1,000 mcg    I, MeDebbrah AlarP, personally preformed the services described in this documentation.  All medical record entries made by the scribe were at my direction and in my presence.  I have reviewed the chart and discharge instructions (if applicable) and agree that the record reflects my personal performance and is accurate and complete. 04/14/2021   I,Shehryar Baig,acting as a scEducation administratoror MeNance PearNP.,have documented all relevant documentation on the behalf of MeNance PearNP,as directed by  MeNance PearNP while in the presence of MeNance PearNP.   MeNance PearNP

## 2021-04-14 NOTE — Assessment & Plan Note (Signed)
bp is stable. Continue toprol xl '25mg'$  and lisinopril 2.'5mg'$ .

## 2021-04-14 NOTE — Assessment & Plan Note (Signed)
Tolerating statin, obtain follow-up lipid panel. 

## 2021-04-14 NOTE — Assessment & Plan Note (Signed)
Stable off of protonix. Monitor.

## 2021-04-14 NOTE — Patient Instructions (Signed)
Please complete lab work prior to leaving.   

## 2021-04-14 NOTE — Assessment & Plan Note (Signed)
Continue fosamax. Bone density is up to date.

## 2021-04-14 NOTE — Telephone Encounter (Signed)
Dear Judson Roch,  I saw Ruth Jensen today. She is supposed to be on lifelong anticoagulation.  She cannot afford Eliquis.  She stopped Eliquis and instead begaqn aspirin '81mg'$ .  I have filled out a patient assistance for for her to submit for Eliquis.  Not sure if you would prefer full dose aspirin or coumadin while we wait?  Thanks,  [employer]

## 2021-04-14 NOTE — Assessment & Plan Note (Signed)
Pt could not afford Eliquis. She therefore stopped and changed to aspirin 81 mg. I have filled out pt assistance forms for Eliquis today for pt to complete and submit. In the meantime, I will reach out to her hematology team to see if they would prefer whole dose aspirin or coumadin.

## 2021-04-17 ENCOUNTER — Telehealth: Payer: Self-pay

## 2021-04-17 ENCOUNTER — Encounter (HOSPITAL_BASED_OUTPATIENT_CLINIC_OR_DEPARTMENT_OTHER): Payer: Self-pay

## 2021-04-17 ENCOUNTER — Other Ambulatory Visit: Payer: Self-pay | Admitting: Gastroenterology

## 2021-04-17 ENCOUNTER — Ambulatory Visit (HOSPITAL_BASED_OUTPATIENT_CLINIC_OR_DEPARTMENT_OTHER)
Admission: RE | Admit: 2021-04-17 | Discharge: 2021-04-17 | Disposition: A | Discharge status: Home / Self Care | Payer: Medicare HMO | Source: Ambulatory Visit | Attending: Family | Admitting: Family

## 2021-04-17 ENCOUNTER — Other Ambulatory Visit: Payer: Self-pay

## 2021-04-17 DIAGNOSIS — Z1231 Encounter for screening mammogram for malignant neoplasm of breast: Secondary | ICD-10-CM | POA: Diagnosis not present

## 2021-04-17 DIAGNOSIS — E559 Vitamin D deficiency, unspecified: Secondary | ICD-10-CM

## 2021-04-17 LAB — COMPREHENSIVE METABOLIC PANEL
AG Ratio: 1.3 (calc) (ref 1.0–2.5)
ALT: 11 U/L (ref 6–29)
AST: 14 U/L (ref 10–35)
Albumin: 4 g/dL (ref 3.6–5.1)
Alkaline phosphatase (APISO): 99 U/L (ref 37–153)
BUN: 13 mg/dL (ref 7–25)
CO2: 26 mmol/L (ref 20–32)
Calcium: 9.9 mg/dL (ref 8.6–10.4)
Chloride: 106 mmol/L (ref 98–110)
Creat: 0.79 mg/dL (ref 0.60–1.00)
Globulin: 3 g/dL (calc) (ref 1.9–3.7)
Glucose, Bld: 86 mg/dL (ref 65–99)
Potassium: 4.6 mmol/L (ref 3.5–5.3)
Sodium: 141 mmol/L (ref 135–146)
Total Bilirubin: 0.5 mg/dL (ref 0.2–1.2)
Total Protein: 7 g/dL (ref 6.1–8.1)

## 2021-04-17 LAB — LIPID PANEL
Cholesterol: 221 mg/dL — ABNORMAL HIGH (ref <200)
HDL: 53 mg/dL (ref >=50)
LDL Cholesterol (Calc): 145 mg/dL (calc) — ABNORMAL HIGH
Non-HDL Cholesterol (Calc): 168 mg/dL (calc) — ABNORMAL HIGH (ref <130)
Total CHOL/HDL Ratio: 4.2 (calc) (ref <5.0)
Triglycerides: 114 mg/dL (ref <150)

## 2021-04-17 LAB — HEPATITIS C ANTIBODY
Hepatitis C Ab: NONREACTIVE
SIGNAL TO CUT-OFF: 0.05 (ref <1.00)

## 2021-04-17 LAB — VITAMIN D 25 HYDROXY (VIT D DEFICIENCY, FRACTURES): Vit D, 25-Hydroxy: 23 ng/mL — ABNORMAL LOW (ref 30–100)

## 2021-04-17 MED ORDER — VITAMIN D (ERGOCALCIFEROL) 1.25 MG (50000 UNIT) PO CAPS: 50000.0000 [IU] | ORAL_CAPSULE | ORAL | 0 refills | Status: DC

## 2021-04-17 NOTE — Telephone Encounter (Signed)
-----   Message from [place], DO sent at 04/17/2021  3:59 PM EDT ----- Vitamin D improved from previous (was 12 in 03/2020), but still insufficiency (currently 23).  - Start Ergocalciferol 50,000 units weekly.  Take 1 tablet every week for 8 weeks, #8, RF0 - Following completion of ergocalciferol, start vitamin D daily supplement 2000 IU daily - Recheck vitamin D in 3 months -Patient has not been seen in the GI clinic since 02/2020.  Was last seen in the Hematology clinic in 05/2020.  Most recent CBC shows stable anemia as of 09/2020

## 2021-04-18 NOTE — Telephone Encounter (Signed)
Medication sent in and patient is aware. Recall letter sent for 06-2021

## 2021-04-19 ENCOUNTER — Telehealth: Payer: Self-pay

## 2021-04-19 NOTE — Telephone Encounter (Signed)
Patient advised of medication dose change and to be aware of phone call to set up follow up

## 2021-04-19 NOTE — Telephone Encounter (Signed)
While we wait on her patient assistance for Eliquis, hematology recommended that she increase her aspirin to two tabs of '81mg'$  once daily. They are also going to reach out to her to schedule a follow up.

## 2021-05-17 ENCOUNTER — Other Ambulatory Visit: Payer: Self-pay

## 2021-05-17 ENCOUNTER — Ambulatory Visit (INDEPENDENT_AMBULATORY_CARE_PROVIDER_SITE_OTHER): Payer: Medicare HMO

## 2021-05-17 DIAGNOSIS — E538 Deficiency of other specified B group vitamins: Secondary | ICD-10-CM

## 2021-05-17 MED ORDER — CYANOCOBALAMIN 1000 MCG/ML IJ SOLN
1000.0000 ug | INTRAMUSCULAR | Status: DC
  Administered 2021-05-17 – 2022-02-06 (×5): 1000 ug via INTRAMUSCULAR

## 2021-05-17 NOTE — Progress Notes (Signed)
Pt here for monthly B12 injection per Melissa  B12 1050mcg given IM, and pt tolerated injection well.  Next B12 injection scheduled for

## 2021-05-31 ENCOUNTER — Inpatient Hospital Stay: Payer: Medicare HMO | Admitting: Family

## 2021-05-31 ENCOUNTER — Inpatient Hospital Stay: Payer: Medicare HMO

## 2021-06-12 IMAGING — MG DIGITAL SCREENING BILAT W/ TOMO W/ CAD
6 of 12 series · 6 of 36 positions shown · non-contrast
Comparison: None.

CLINICAL DATA: Screening.

EXAM:
DIGITAL SCREENING BILATERAL MAMMOGRAM WITH TOMO AND CAD

[R XCCM synth-2D]
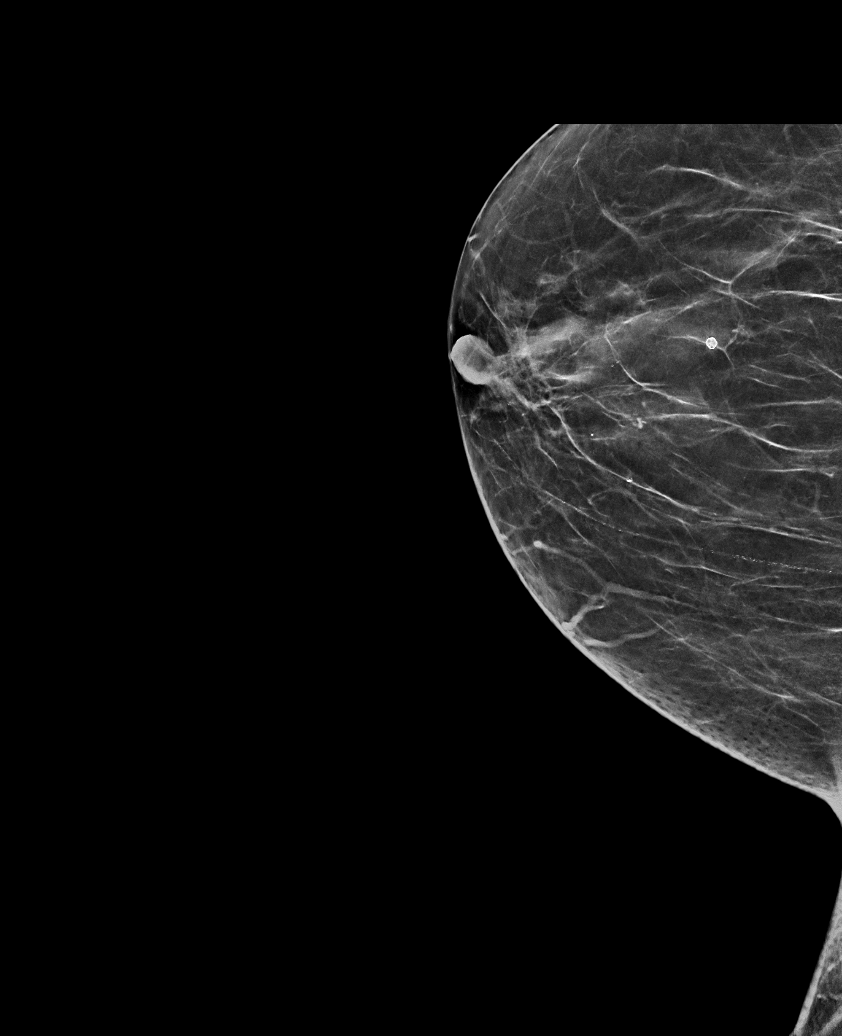

[L CC synth-2D]
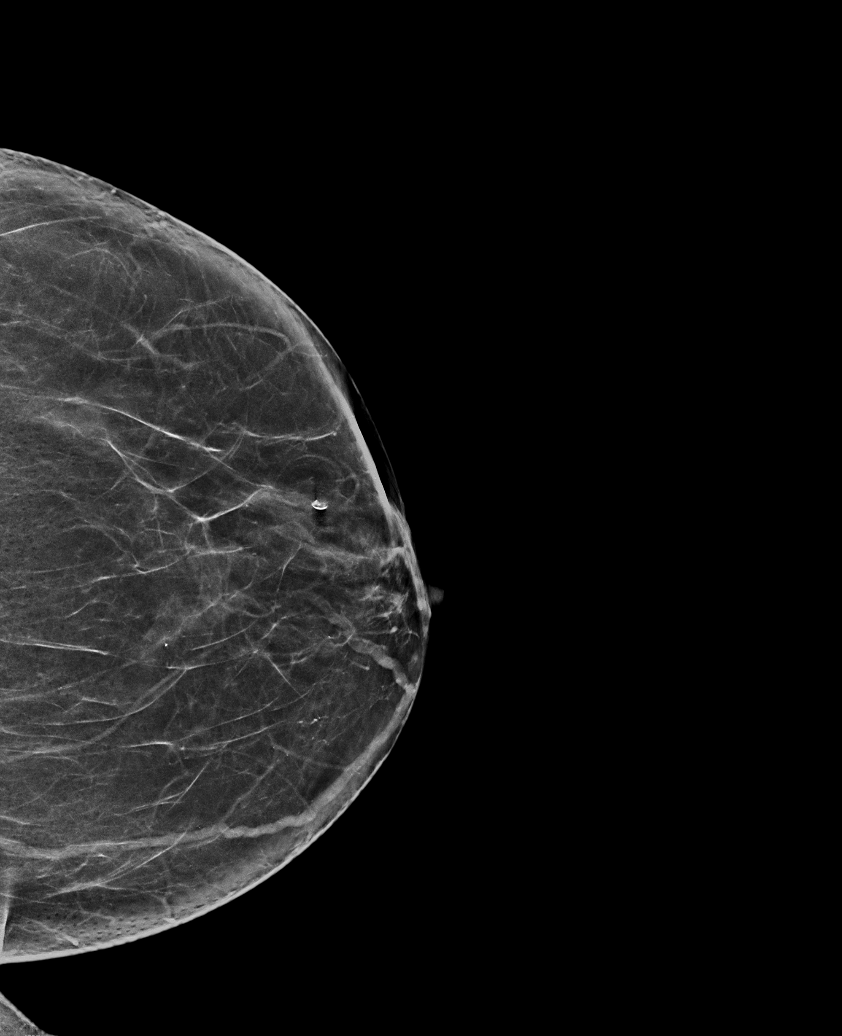

[L MLO synth-2D (1 of 2)]
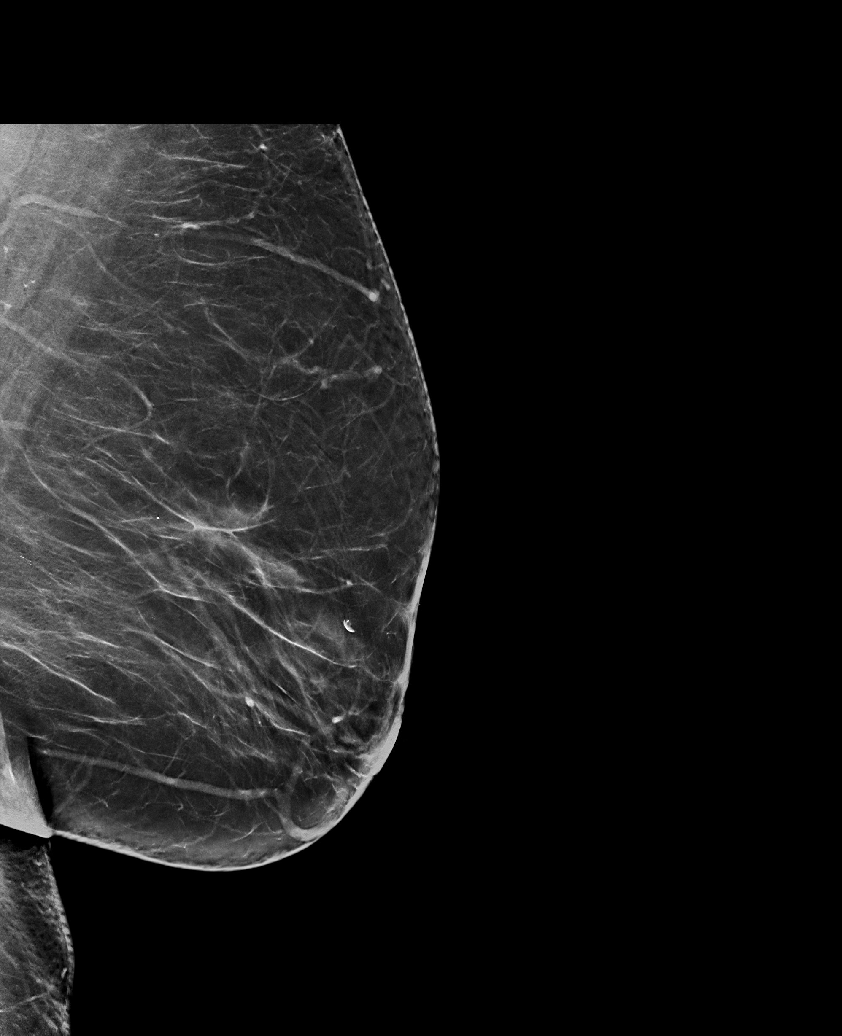

[R CC synth-2D]
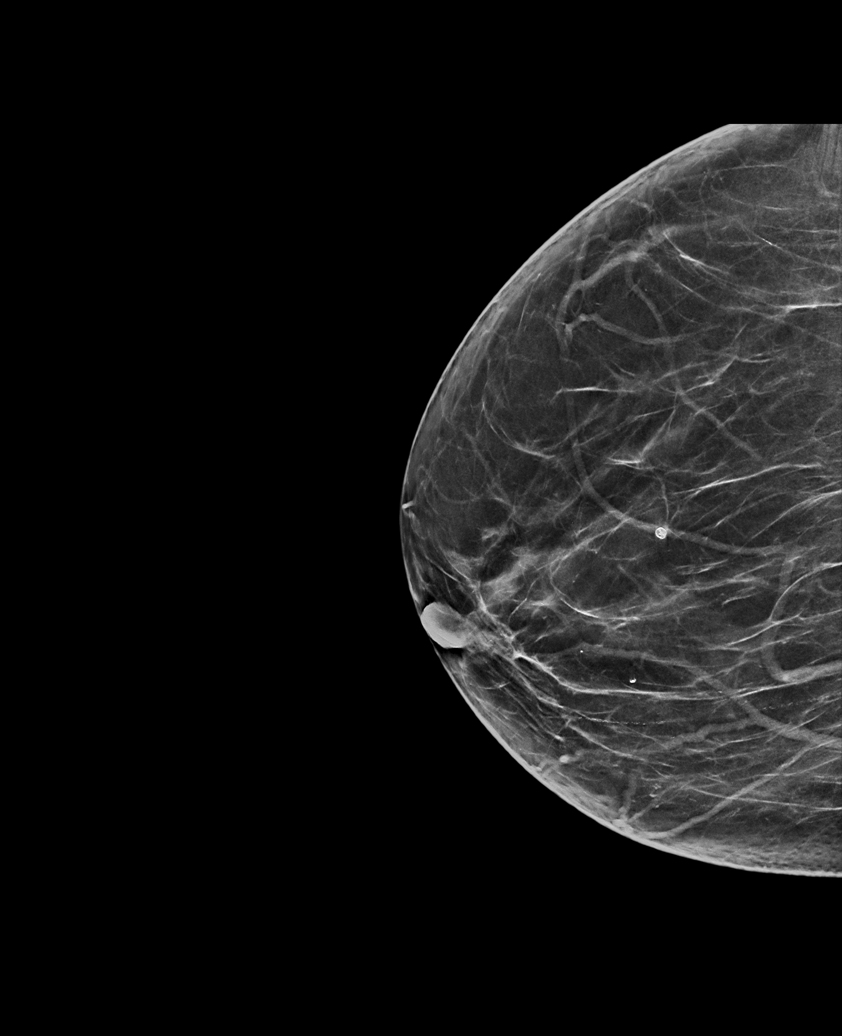

[R MLO synth-2D]
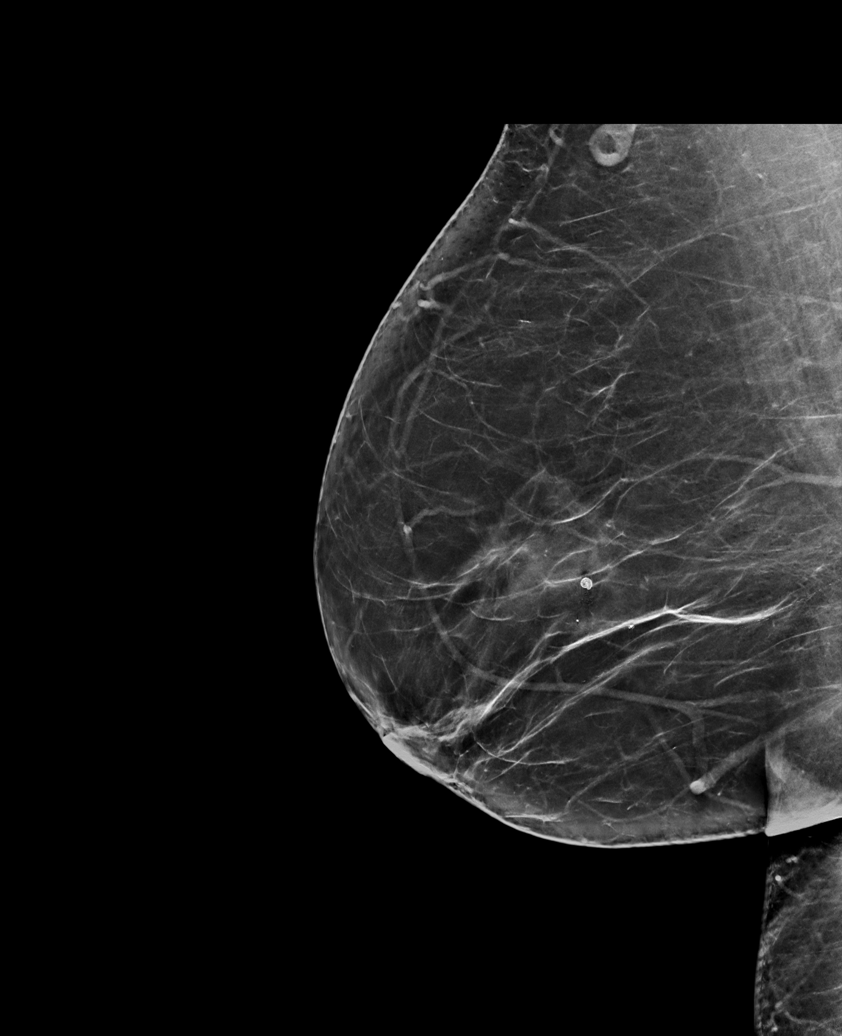

[L MLO synth-2D (2 of 2)]
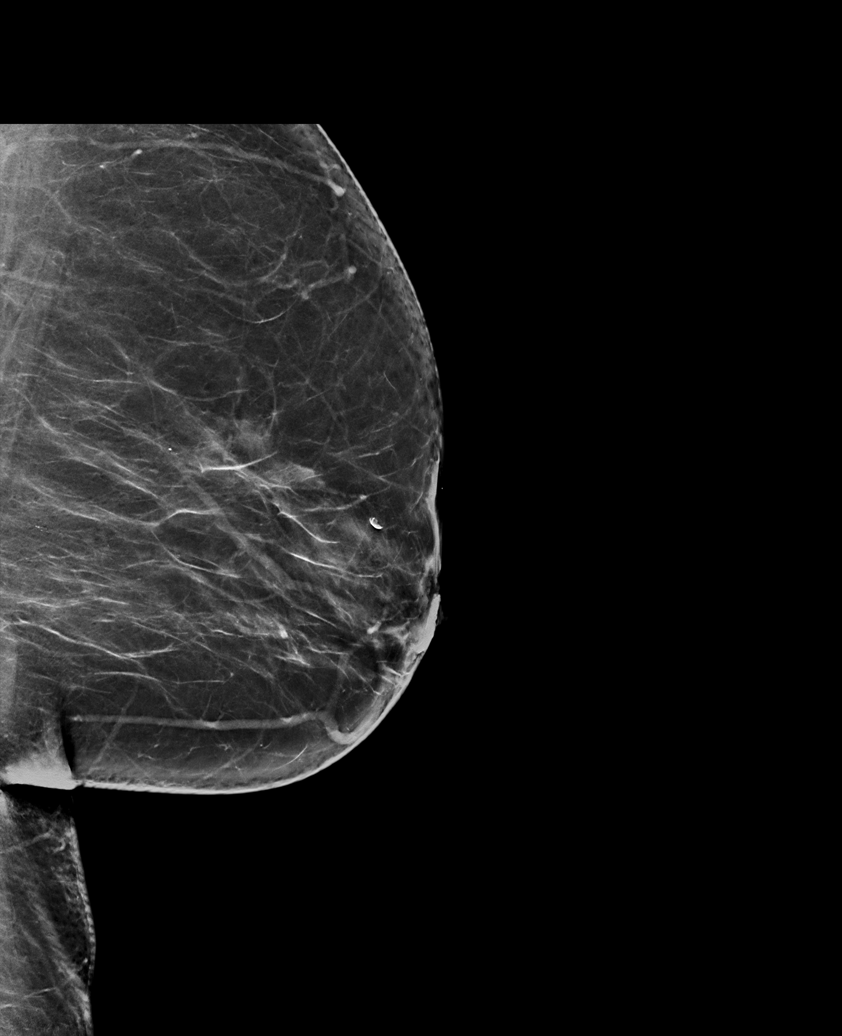

[6 of 36 positions shown; findings below may reference images not displayed]

ACR Breast Density Category b: There are scattered areas of
fibroglandular density.
FINDINGS: There are no findings suspicious for malignancy. Images were
processed with CAD.
IMPRESSION: No mammographic evidence of malignancy. A result letter of this
screening mammogram will be mailed directly to the patient.

RECOMMENDATION:
Screening mammogram in one year. (Code:Y5-G-EJ6)

BI-RADS CATEGORY  1: Negative.

## 2021-06-16 ENCOUNTER — Other Ambulatory Visit: Payer: Self-pay

## 2021-06-16 ENCOUNTER — Ambulatory Visit (INDEPENDENT_AMBULATORY_CARE_PROVIDER_SITE_OTHER): Payer: Medicare HMO

## 2021-06-16 DIAGNOSIS — E538 Deficiency of other specified B group vitamins: Secondary | ICD-10-CM | POA: Diagnosis not present

## 2021-06-16 MED ORDER — CYANOCOBALAMIN 1000 MCG/ML IJ SOLN
1000.0000 ug | Freq: Once | INTRAMUSCULAR | Status: AC
  Administered 2021-06-16: 1000 ug via INTRAMUSCULAR

## 2021-06-16 NOTE — Progress Notes (Addendum)
Pt is here today for monthly B12 injection. Pt was given B12 injection in right deltoid. Pt tolerated well °

## 2021-06-23 ENCOUNTER — Other Ambulatory Visit: Payer: Self-pay | Admitting: Gastroenterology

## 2021-06-23 ENCOUNTER — Other Ambulatory Visit: Payer: Self-pay | Admitting: Family

## 2021-06-23 DIAGNOSIS — I82431 Acute embolism and thrombosis of right popliteal vein: Secondary | ICD-10-CM

## 2021-06-23 DIAGNOSIS — I2699 Other pulmonary embolism without acute cor pulmonale: Secondary | ICD-10-CM

## 2021-06-23 DIAGNOSIS — D509 Iron deficiency anemia, unspecified: Secondary | ICD-10-CM

## 2021-06-26 ENCOUNTER — Other Ambulatory Visit: Payer: Self-pay

## 2021-06-26 ENCOUNTER — Inpatient Hospital Stay (HOSPITAL_BASED_OUTPATIENT_CLINIC_OR_DEPARTMENT_OTHER): Payer: Medicare HMO | Admitting: Family

## 2021-06-26 ENCOUNTER — Inpatient Hospital Stay: Payer: Medicare HMO | Attending: Hematology & Oncology

## 2021-06-26 ENCOUNTER — Telehealth: Payer: Self-pay | Admitting: *Deleted

## 2021-06-26 VITALS — BP 145/62 | HR 85 | Temp 98.9°F | Resp 18 | Ht 62.0 in | Wt 193.0 lb

## 2021-06-26 DIAGNOSIS — Z7982 Long term (current) use of aspirin: Secondary | ICD-10-CM | POA: Insufficient documentation

## 2021-06-26 DIAGNOSIS — D509 Iron deficiency anemia, unspecified: Secondary | ICD-10-CM | POA: Diagnosis not present

## 2021-06-26 DIAGNOSIS — I82431 Acute embolism and thrombosis of right popliteal vein: Secondary | ICD-10-CM | POA: Diagnosis not present

## 2021-06-26 DIAGNOSIS — I2699 Other pulmonary embolism without acute cor pulmonale: Secondary | ICD-10-CM

## 2021-06-26 DIAGNOSIS — E611 Iron deficiency: Secondary | ICD-10-CM | POA: Diagnosis not present

## 2021-06-26 DIAGNOSIS — I82401 Acute embolism and thrombosis of unspecified deep veins of right lower extremity: Secondary | ICD-10-CM | POA: Diagnosis not present

## 2021-06-26 LAB — CMP (CANCER CENTER ONLY)
ALT: 12 U/L (ref 0–44)
AST: 15 U/L (ref 15–41)
Albumin: 3.6 g/dL (ref 3.5–5.0)
Alkaline Phosphatase: 105 U/L (ref 38–126)
Anion gap: 7 (ref 5–15)
BUN: 14 mg/dL (ref 8–23)
CO2: 27 mmol/L (ref 22–32)
Calcium: 9.8 mg/dL (ref 8.9–10.3)
Chloride: 108 mmol/L (ref 98–111)
Creatinine: 0.55 mg/dL (ref 0.44–1.00)
GFR, Estimated: >60 mL/min (ref >60)
Glucose, Bld: 90 mg/dL (ref 70–99)
Potassium: 3.6 mmol/L (ref 3.5–5.1)
Sodium: 142 mmol/L (ref 135–145)
Total Bilirubin: 0.7 mg/dL (ref 0.3–1.2)
Total Protein: 6.8 g/dL (ref 6.5–8.1)

## 2021-06-26 LAB — CBC WITH DIFFERENTIAL (CANCER CENTER ONLY)
Abs Immature Granulocytes: 0.01 10*3/uL (ref 0.00–0.07)
Basophils Absolute: 0 10*3/uL (ref 0.0–0.1)
Basophils Relative: 0 %
Eosinophils Absolute: 0.2 10*3/uL (ref 0.0–0.5)
Eosinophils Relative: 4 %
HCT: 33.5 % — ABNORMAL LOW (ref 36.0–46.0)
Hemoglobin: 10.5 g/dL — ABNORMAL LOW (ref 12.0–15.0)
Immature Granulocytes: 0 %
Lymphocytes Relative: 34 %
Lymphs Abs: 1.9 10*3/uL (ref 0.7–4.0)
MCH: 24.4 pg — ABNORMAL LOW (ref 26.0–34.0)
MCHC: 31.3 g/dL (ref 30.0–36.0)
MCV: 77.7 fL — ABNORMAL LOW (ref 80.0–100.0)
Monocytes Absolute: 0.6 10*3/uL (ref 0.1–1.0)
Monocytes Relative: 12 %
Neutro Abs: 2.7 10*3/uL (ref 1.7–7.7)
Neutrophils Relative %: 50 %
Platelet Count: 289 10*3/uL (ref 150–400)
RBC: 4.31 MIL/uL (ref 3.87–5.11)
RDW: 13.2 % (ref 11.5–15.5)
WBC Count: 5.5 10*3/uL (ref 4.0–10.5)
nRBC: 0 % (ref 0.0–0.2)

## 2021-06-26 LAB — D-DIMER, QUANTITATIVE: D-Dimer, Quant: 0.62 ug/mL-FEU — ABNORMAL HIGH (ref 0.00–0.50)

## 2021-06-26 NOTE — Telephone Encounter (Signed)
Per 06/26/21 los - gave upcoming appointment - confirmed

## 2021-06-26 NOTE — Progress Notes (Signed)
Hematology and Oncology Follow Up Visit  Ruth Jensen [id number] 1950/12/31 70 y.o. 06/26/2021   Principle Diagnosis:  Left lung PTE and RLE DVT, unprovoked   Current Therapy:        06/2019 - present: Xarelto 20mg  daily; lifelong anticoagulation - patient discontinued due to cost 2 baby aspirin daily   Interim History:  Ruth Jensen is here today for follow-up. She is doing quite well and has no complaints at this time.  She states that she just could not afford Xarelto and has been on 2 baby aspirin daily for the last 6 months or so.  She is tolerating this well and remains asymptomatic.  No blood loss, bruising or petechiae.  No fever, chills, n/v, cough, rash, dizziness, SOB, chest pain, palpitations, abdominal pain or changes in bowel or bladder habits.   No swelling, tenderness, numbness or tingling in her extremities.  No falls or syncope to report.  She has maintained a good appetite and is staying well hydrated. Her weight is stable at 193 lbs.   ECOG Performance Status: 0 - Asymptomatic  Medications:  Allergies as of 06/26/2021   No Known Allergies      Medication List        Accurate as of June 26, 2021 11:25 AM. If you have any questions, ask your nurse or doctor.          STOP taking these medications    Vitamin D (Ergocalciferol) 1.25 MG (50000 UNIT) Caps capsule Commonly known as: DRISDOL Stopped by: Lottie Dawson, NP       TAKE these medications    alendronate 70 MG tablet Commonly known as: FOSAMAX TAKE 1 TABLET(70 MG) BY MOUTH EVERY 7 DAYS WITH A FULL GLASS OF WATER AND ON AN EMPTY STOMACH   aspirin EC 81 MG tablet Take 162 mg by mouth daily. Swallow whole.   Calcium Carbonate-Vitamin D 600-400 MG-UNIT chew tablet Chew 1 tablet by mouth 2 (two) times daily.   lisinopril 2.5 MG tablet Commonly known as: ZESTRIL TAKE 1 TABLET(2.5 MG) BY MOUTH DAILY   metoprolol succinate 25 MG 24 hr tablet Commonly known as: TOPROL-XL TAKE 1  TABLET(25 MG) BY MOUTH DAILY   multivitamin with minerals Tabs tablet Take 1 tablet by mouth daily.   nitroGLYCERIN 0.4 MG SL tablet Commonly known as: NITROSTAT Place 1 tablet (0.4 mg total) under the tongue every 5 (five) minutes as needed.   rosuvastatin 10 MG tablet Commonly known as: CRESTOR Take 1 tablet (10 mg total) by mouth daily.   Shingrix injection Generic drug: Zoster Vaccine Adjuvanted INJECT 0.5MG  INTO THE MUSCLE NOW AND AGAIN IN 2-6 MONTHS.        Allergies: No Known Allergies  Past Medical History, Surgical history, Social history, and Family History were reviewed and updated.  Review of Systems: All other 10 point review of systems is negative.   Physical Exam:  height is 5\' 2"  (1.575 m) and weight is 193 lb (87.5 kg). Her oral temperature is 98.9 F (37.2 C). Her blood pressure is 145/62 (abnormal) and her pulse is 85. Her respiration is 18 and oxygen saturation is 99%.   Wt Readings from Last 3 Encounters:  06/26/21 193 lb (87.5 kg)  04/14/21 193 lb 12.8 oz (87.9 kg)  02/22/21 190 lb (86.2 kg)    Ocular: Sclerae unicteric, pupils equal, round and reactive to light Ear-nose-throat: Oropharynx clear, dentition fair Lymphatic: No cervical or supraclavicular adenopathy Lungs no rales or rhonchi, good excursion bilaterally Heart  regular rate and rhythm, no murmur appreciated Abd soft, nontender, positive bowel sounds MSK no focal spinal tenderness, no joint edema Neuro: non-focal, well-oriented, appropriate affect Breasts: Deferred   Lab Results  Component Value Date   WBC 5.5 06/26/2021   HGB 10.5 (L) 06/26/2021   HCT 33.5 (L) 06/26/2021   MCV 77.7 (L) 06/26/2021   PLT 289 06/26/2021   Lab Results  Component Value Date   FERRITIN <4 (L) 06/16/2020   IRON 74 06/16/2020   TIBC 408 06/16/2020   UIBC 334 06/16/2020   IRONPCTSAT 18 (L) 06/16/2020   Lab Results  Component Value Date   RETICCTPCT 1.9 07/03/2019   RBC 4.31 06/26/2021   No  results found for: KPAFRELGTCHN, LAMBDASER, [facility] Lab Results  Component Value Date   IGA 320 04/06/2020   No results found for: Odetta Pink, SPEI   Chemistry      Component Value Date/Time   NA 142 06/26/2021 1022   NA 145 (H) 11/25/2019 1012   K 3.6 06/26/2021 1022   CL 108 06/26/2021 1022   CO2 27 06/26/2021 1022   BUN 14 06/26/2021 1022   BUN 11 11/25/2019 1012   CREATININE 0.55 06/26/2021 1022   CREATININE 0.79 04/14/2021 1447      Component Value Date/Time   CALCIUM 9.8 06/26/2021 1022   ALKPHOS 105 06/26/2021 1022   AST 15 06/26/2021 1022   ALT 12 06/26/2021 1022   BILITOT 0.7 06/26/2021 1022       Impression and Plan: Ruth Jensen is a very pleasant 70 yo African American female with left lung PTE and RLE DVT, unprovoked.  She is doing well on 2 baby aspirin daily. So far there has been no evidence of recurrence.  Follow-up in 6 months.  She can contact our office with any questions or concerns.   Lottie Dawson, NP 10/31/202211:25 AM

## 2021-06-27 LAB — FERRITIN: Ferritin: 11 ng/mL (ref 11–307)

## 2021-06-27 LAB — IRON AND TIBC
Iron: 78 ug/dL (ref 41–142)
Saturation Ratios: 23 % (ref 21–57)
TIBC: 333 ug/dL (ref 236–444)
UIBC: 255 ug/dL (ref 120–384)

## 2021-07-25 ENCOUNTER — Other Ambulatory Visit: Payer: Self-pay

## 2021-07-25 ENCOUNTER — Ambulatory Visit (INDEPENDENT_AMBULATORY_CARE_PROVIDER_SITE_OTHER): Payer: Medicare HMO

## 2021-07-25 DIAGNOSIS — E538 Deficiency of other specified B group vitamins: Secondary | ICD-10-CM | POA: Diagnosis not present

## 2021-07-25 NOTE — Progress Notes (Signed)
Ruth Jensen is a 70 y.o. female presents to the office today for Monthly B12 injection, per physician's orders. Original order: 01/13/20 B12 1000 mcg, IM was administered L Deltoid today. Patient tolerated injection. Patient due for follow up labs/provider appt: No. Date due: February, a "Return in about 6 months (around 10/15/2021) for annual physical." Appt made No- apt already pending  Patient next injection due: 1 month, appt made Yes  Creft, [name] L

## 2021-07-27 ENCOUNTER — Other Ambulatory Visit: Payer: Self-pay | Admitting: Family

## 2021-08-07 ENCOUNTER — Telehealth: Payer: Self-pay | Admitting: Family

## 2021-08-07 NOTE — Telephone Encounter (Signed)
Pt stated she had no refills.   Medication: metoprolol succinate (TOPROL-XL) 25 MG 24 hr tablet  Has the patient contacted their pharmacy? No.   Preferred Pharmacy: [id number] - [place], [place] - [address] [place]  [address], [place] [address]  Phone:  [phone]  Fax:  [phone]

## 2021-08-07 NOTE — Telephone Encounter (Signed)
Rx was sent 07-28-21

## 2021-08-24 ENCOUNTER — Ambulatory Visit (INDEPENDENT_AMBULATORY_CARE_PROVIDER_SITE_OTHER): Payer: Medicare HMO

## 2021-08-24 DIAGNOSIS — E538 Deficiency of other specified B group vitamins: Secondary | ICD-10-CM | POA: Diagnosis not present

## 2021-08-24 NOTE — Progress Notes (Signed)
Pt here for monthly B12 injection per Melissa   B12 1052mcg given left deltoid, and pt tolerated injection well.   Next B12 injection scheduled for 09/26/21

## 2021-08-27 HISTORY — PX: OTHER SURGICAL HISTORY: SHX169

## 2021-09-26 ENCOUNTER — Ambulatory Visit (INDEPENDENT_AMBULATORY_CARE_PROVIDER_SITE_OTHER): Payer: Medicare HMO

## 2021-09-26 ENCOUNTER — Other Ambulatory Visit: Payer: Self-pay

## 2021-09-26 DIAGNOSIS — E538 Deficiency of other specified B group vitamins: Secondary | ICD-10-CM | POA: Diagnosis not present

## 2021-09-26 MED ORDER — LISINOPRIL 2.5 MG PO TABS: ORAL_TABLET | ORAL | 0 refills | Status: DC

## 2021-09-26 MED ORDER — CYANOCOBALAMIN 1000 MCG/ML IJ SOLN
1000.0000 ug | Freq: Once | INTRAMUSCULAR | Status: AC
  Administered 2021-09-26: 1000 ug via INTRAMUSCULAR

## 2021-09-26 NOTE — Progress Notes (Signed)
Pt here for monthly B12 injection per PCP.   B12 1037mcg administered IM in left deltoid, and pt tolerated injection well.  Next B12 injection scheduled for one month.   Gerilyn Nestle

## 2021-09-27 ENCOUNTER — Other Ambulatory Visit: Payer: Self-pay | Admitting: Family

## 2021-10-20 ENCOUNTER — Encounter: Payer: Self-pay | Admitting: Family

## 2021-10-20 ENCOUNTER — Ambulatory Visit (INDEPENDENT_AMBULATORY_CARE_PROVIDER_SITE_OTHER): Payer: Medicare PPO | Admitting: Family

## 2021-10-20 ENCOUNTER — Other Ambulatory Visit (HOSPITAL_BASED_OUTPATIENT_CLINIC_OR_DEPARTMENT_OTHER): Payer: Self-pay

## 2021-10-20 VITALS — BP 124/54 | HR 74 | Temp 98.7°F | Resp 16 | Ht 62.0 in | Wt 187.0 lb

## 2021-10-20 DIAGNOSIS — Z0000 Encounter for general adult medical examination without abnormal findings: Secondary | ICD-10-CM | POA: Diagnosis not present

## 2021-10-20 DIAGNOSIS — M810 Age-related osteoporosis without current pathological fracture: Secondary | ICD-10-CM

## 2021-10-20 DIAGNOSIS — E559 Vitamin D deficiency, unspecified: Secondary | ICD-10-CM | POA: Diagnosis not present

## 2021-10-20 DIAGNOSIS — Z86718 Personal history of other venous thrombosis and embolism: Secondary | ICD-10-CM

## 2021-10-20 DIAGNOSIS — E782 Mixed hyperlipidemia: Secondary | ICD-10-CM

## 2021-10-20 DIAGNOSIS — Z23 Encounter for immunization: Secondary | ICD-10-CM

## 2021-10-20 DIAGNOSIS — I10 Essential (primary) hypertension: Secondary | ICD-10-CM | POA: Diagnosis not present

## 2021-10-20 DIAGNOSIS — E538 Deficiency of other specified B group vitamins: Secondary | ICD-10-CM

## 2021-10-20 LAB — COMPREHENSIVE METABOLIC PANEL
ALT: 10 U/L (ref 0–35)
AST: 14 U/L (ref 0–37)
Albumin: 3.9 g/dL (ref 3.5–5.2)
Alkaline Phosphatase: 98 U/L (ref 39–117)
BUN: 12 mg/dL (ref 6–23)
CO2: 35 mEq/L — ABNORMAL HIGH (ref 19–32)
Calcium: 10 mg/dL (ref 8.4–10.5)
Chloride: 104 mEq/L (ref 96–112)
Creatinine, Ser: 0.61 mg/dL (ref 0.40–1.20)
GFR: 89.67 mL/min (ref >60.00)
Glucose, Bld: 74 mg/dL (ref 70–99)
Potassium: 4.2 mEq/L (ref 3.5–5.1)
Sodium: 140 mEq/L (ref 135–145)
Total Bilirubin: 0.6 mg/dL (ref 0.2–1.2)
Total Protein: 7.1 g/dL (ref 6.0–8.3)

## 2021-10-20 LAB — VITAMIN B12: Vitamin B-12: 567 pg/mL (ref 211–911)

## 2021-10-20 LAB — LIPID PANEL
Cholesterol: 247 mg/dL — ABNORMAL HIGH (ref 0–200)
HDL: 52.7 mg/dL (ref >39.00)
LDL Cholesterol: 175 mg/dL — ABNORMAL HIGH (ref 0–99)
NonHDL: 194.39
Total CHOL/HDL Ratio: 5
Triglycerides: 98 mg/dL (ref 0.0–149.0)
VLDL: 19.6 mg/dL (ref 0.0–40.0)

## 2021-10-20 LAB — VITAMIN D 25 HYDROXY (VIT D DEFICIENCY, FRACTURES): VITD: 30.61 ng/mL (ref 30.00–100.00)

## 2021-10-20 MED ORDER — BOOSTRIX 5-2.5-18.5 LF-MCG/0.5 IM SUSY
0.5000 mL | PREFILLED_SYRINGE | Freq: Once | INTRAMUSCULAR | 0 refills | Status: AC
  Filled 2021-10-20: qty 0.5, 1d supply, fill #0

## 2021-10-20 MED ORDER — CALTRATE 600+D PLUS MINERALS 600-800 MG-UNIT PO TABS: ORAL_TABLET | ORAL | Status: DC

## 2021-10-20 NOTE — Assessment & Plan Note (Signed)
Continues b12 injections 1059mcg IM monthly. Check follow up b12 level.

## 2021-10-20 NOTE — Assessment & Plan Note (Signed)
Stopped crestor due to nausea. Check lipid panel. If elevated, she is willing to trial a different statin.

## 2021-10-20 NOTE — Patient Instructions (Addendum)
Please get the bivalent covid booster shot at your pharmacy.  Please schedule a routine eye exam and dental exam.  Add clatrate 600mg  twice daily for bone health. Try to add 30 minutes of walking 5 days a week.

## 2021-10-20 NOTE — Assessment & Plan Note (Signed)
BP at goal. Continue toprol xl and lisinopril.

## 2021-10-20 NOTE — Assessment & Plan Note (Signed)
Recommended tetanus/covid bivalent booster at pharmacy. She could not afford the copay on the Shingrix. High dose flu shot and Prevnar 20 today in office. Colo up to date. Mammo will be due this summer. Pt is advised as follows:   Please schedule a routine eye exam and dental exam.  Add caltrate 600mg  twice daily for bone health. Try to add 30 minutes of walking 5 days a week.

## 2021-10-20 NOTE — Assessment & Plan Note (Signed)
Continue fosamax, not taking calcium. Add caltrate 600mg  +D bid.

## 2021-10-20 NOTE — Assessment & Plan Note (Addendum)
>>  ASSESSMENT AND PLAN FOR ESSENTIAL HYPERTENSION WRITTEN ON 10/20/2021 10:51 AM BY O'SULLIVAN, Kaine Mcquillen, NP  Stable. Continue current doses of toprol /lisinopril .    >>ASSESSMENT AND PLAN FOR HYPERTENSION WRITTEN ON 10/20/2021 11:08 AM BY O'SULLIVAN, Madalena Kesecker, NP  BP at goal. Continue toprol  xl and lisinopril .

## 2021-10-20 NOTE — Assessment & Plan Note (Signed)
Could not afford xarelto. She is taking 2 baby aspirin/day.  Continues to follow with hematology.

## 2021-10-20 NOTE — Progress Notes (Signed)
Subjective:     Patient ID: Ruth Jensen, female    DOB: 14-Jun-1951, 71 y.o.   MRN: [id number]  Chief Complaint  Patient presents with   Annual Exam    HPI Patient is in today for CPX.  Immunizations: due for PCV 20, tetanus.  Diet: reports diet is healthy Wt Readings from Last 3 Encounters:  10/20/21 187 lb (84.8 kg)  06/26/21 193 lb (87.5 kg)  04/14/21 193 lb 12.8 oz (87.9 kg)  Exercise: active during the day Colonoscopy: 01/12/2020 Dexa: 11/17/19 Pap Smear:  N/A Mammogram:' 04/17/21 Vision: due Dental: due  HTN- BP meds include toprol xl 79m and lisinopril 2.540m  BP Readings from Last 3 Encounters:  10/20/21 (!) 124/54  06/26/21 (!) 145/62  04/14/21 136/77   Hyperlipidemia- reports no longer taking crestor. It caused her nausea.   Lab Results  Component Value Date   CHOL 221 (H) 04/14/2021   HDL 53 04/14/2021   LDLCALC 145 (H) 04/14/2021   TRIG 114 04/14/2021   CHOLHDL 4.2 04/14/2021   Osteoporosis- maintained on fosamax.  Dex a 2021.Not taking calcium   B12 deficiency- continues b12 injections monthly.   Hx of DVT- she discontinued recommended lifelong anticoagulation with Xarelto due to cost. She instead is taking 2 baby aspirin a day. She continues to follow with hematology.  She complains of constipation.  She is inquiring about miralax.       Health Maintenance Due  Topic Date Due   TETANUS/TDAP  Never done   Zoster Vaccines- Shingrix (1 of 2) Never done   COVID-19 Vaccine (4 - Booster for Pfizer series) 08/12/2020   INFLUENZA VACCINE  03/27/2021   Pneumonia Vaccine 6531Years old (2 - PCV) 10/04/2021    Past Medical History:  Diagnosis Date   Abnormal echocardiogram 07/22/2019   Abnormal EKG 07/22/2019   Acute deep vein thrombosis (DVT) of popliteal vein of right lower extremity ([place]/11[place]   Acute pulmonary embolism ([facility]   Chest pain 07/22/2019   Depressed left ventricular ejection fraction 07/22/2019   DVT (deep venous  thrombosis) ([facility]   History of DVT (deep vein thrombosis) 07/01/2019   History of pulmonary embolism 07/02/2019   Hypertension    Lipid screening 07/22/2019   Obesity (BMI 30-39.9) 07/22/2019   Osteoporosis 11/18/2019   Pulmonary embolism ([facility]11/12/2018   Single subsegmental pulmonary embolism without acute cor pulmonale ([facility]11/11/2018    Past Surgical History:  Procedure Laterality Date   ABDOMINAL HYSTERECTOMY  2010   due to fibroids   ABDOMINAL HYSTERECTOMY     BREAST BIOPSY Left    BREAST CYST EXCISION Left    [facility]    LEFT HEART CATH AND CORONARY ANGIOGRAPHY N/A 08/03/2019   Procedure: LEFT HEART CATH AND CORONARY ANGIOGRAPHY;  Surgeon: BeLorretta HarpMD;  [facility];  Service: Cardiovascular;  Laterality: N/A;   OOPHORECTOMY      Family History  Problem Relation Age of Onset   Hypertension Mother    CVA Mother        hemiparesis   Hypertension Father    Dementia Father    Prostate cancer Father    Hypertension Sister    CAD Sister        scheduled for pacemaker   Hypertension Sister    Skin cancer Sister        melanoma died at age 11055 Hypertension Sister        ?  PFO repair   Parkinson's disease Sister    Asthma Paternal Grandfather    Hypertension Daughter    Hypertension Son        pacemaker   Colon cancer Neg Hx    Esophageal cancer Neg Hx     Social History   Socioeconomic History   Marital status: Married    Spouse name: Truman Hayward   Number of children: 2   Years of education: Not on file   Highest education level: Not on file  Occupational History   Occupation: retired   Tobacco Use   Smoking status: Never   Smokeless tobacco: Never  Vaping Use   Vaping Use: Never used  Substance and Sexual Activity   Alcohol use: Not Currently   Drug use: Never   Sexual activity: Not Currently  Other Topics Concern   Not on file  Social History Narrative   Married   2 children (son  and daughter) live locally   4 grandchildren   Retired   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   No pets   Enjoys spending time with grandchildren, baseball games, walking   Social Determinants of Health   Financial Resource Strain: Low Risk    Difficulty of Paying Living Expenses: Not hard at all  Food Insecurity: No Food Insecurity   Worried About Charity fundraiser in the Last Year: Never true   Arboriculturist in the Last Year: Never true  Transportation Needs: No Transportation Needs   Lack of Transportation (Medical): No   Lack of Transportation (Non-Medical): No  Physical Activity: Inactive   Days of Exercise per Week: 0 days   Minutes of Exercise per Session: 0 min  Stress: No Stress Concern Present   Feeling of Stress : Not at all  Social Connections: Moderately Integrated   Frequency of Communication with Friends and Family: More than three times a week   Frequency of Social Gatherings with Friends and Family: More than three times a week   Attends Religious Services: More than 4 times per year   Active Member of [employer] or Organizations: No   Attends Archivist Meetings: Never   Marital Status: Married  Human resources officer Violence: Not At Risk   Fear of Current or Ex-Partner: No   Emotionally Abused: No   Physically Abused: No   Sexually Abused: No    Outpatient Medications Prior to Visit  Medication Sig Dispense Refill   alendronate (FOSAMAX) 70 MG tablet TAKE 1 TABLET(70 MG) BY MOUTH EVERY 7 DAYS WITH A FULL GLASS OF WATER AND ON AN EMPTY STOMACH 4 tablet 11   aspirin EC 81 MG tablet Take 162 mg by mouth daily. Swallow whole.     Cholecalciferol (VITAMIN D3) 10 MCG (400 UNIT) CAPS Take by mouth.     lisinopril (ZESTRIL) 2.5 MG tablet TAKE 1 TABLET(2.5 MG) BY MOUTH DAILY 90 tablet 0   metoprolol succinate (TOPROL-XL) 25 MG 24 hr tablet TAKE 1 TABLET(25 MG) BY MOUTH DAILY 90 tablet 1   nitroGLYCERIN (NITROSTAT) 0.4 MG SL tablet Place 1 tablet (0.4 mg  total) under the tongue every 5 (five) minutes as needed. 30 tablet 3   rosuvastatin (CRESTOR) 10 MG tablet Take 1 tablet (10 mg total) by mouth daily. (Patient not taking: Reported on 06/26/2021) 30 tablet 3   Calcium Carbonate-Vitamin D 600-400 MG-UNIT chew tablet Chew 1 tablet by mouth 2 (two) times daily.     Multiple Vitamin (MULTIVITAMIN WITH MINERALS) TABS  tablet Take 1 tablet by mouth daily. 30 tablet 0   Facility-Administered Medications Prior to Visit  Medication Dose Route Frequency Provider Last Rate Last Admin   cyanocobalamin ((VITAMIN B-12)) injection 1,000 mcg  1,000 mcg Intramuscular Q30 days [name], NP   1,000 mcg at 03/03/21 1040   cyanocobalamin ((VITAMIN B-12)) injection 1,000 mcg  1,000 mcg Intramuscular Once [name], NP       cyanocobalamin ((VITAMIN B-12)) injection 1,000 mcg  1,000 mcg Intramuscular Q30 days [name], NP   1,000 mcg at 08/24/21 1007    No Known Allergies  Review of Systems  Constitutional:  Negative for weight loss.  HENT:  Negative for congestion and hearing loss.   Eyes:  Negative for blurred vision.  Respiratory:  Negative for cough and shortness of breath.   Cardiovascular:  Negative for chest pain.  Gastrointestinal:  Positive for constipation.  Genitourinary:  Negative for dysuria and frequency.  Musculoskeletal:  Negative for joint pain and myalgias.  Neurological:  Positive for headaches (rare).  Psychiatric/Behavioral:         Denies depression/anxiety      Objective:    Physical Exam  BP (!) 124/54 (BP Location: Right Arm, Patient Position: Sitting, Cuff Size: Large)    Pulse 74    Temp 98.7 F (37.1 C) (Oral)    Resp 16    Ht 5' 2"  (1.575 m)    Wt 187 lb (84.8 kg)    SpO2 100%    BMI 34.20 kg/m  Wt Readings from Last 3 Encounters:  10/20/21 187 lb (84.8 kg)  06/26/21 193 lb (87.5 kg)  04/14/21 193 lb 12.8 oz (87.9 kg)   Physical Exam  Constitutional: She is oriented to person, place, and  time. She appears well-developed and well-nourished. No distress.  HENT:  Head: Normocephalic and atraumatic.  Right Ear: Tympanic membrane and ear canal normal.  Left Ear: Tympanic membrane and ear canal normal.  Mouth/Throat: Not examined- pt wearing mask Eyes: Pupils are equal, round, and reactive to light. No scleral icterus.  Neck: Normal range of motion. No thyromegaly present.  Cardiovascular: Normal rate and regular rhythm.   No murmur heard. Pulmonary/Chest: Effort normal and breath sounds normal. No respiratory distress. He has no wheezes. She has no rales. She exhibits no tenderness.  Abdominal: Soft. Bowel sounds are normal. She exhibits no distension and no mass. There is no tenderness. There is no rebound and no guarding.  Musculoskeletal: She exhibits no edema.  Lymphadenopathy:    She has no cervical adenopathy.  Neurological: She is alert and oriented to person, place, and time. She has normal patellar reflexes. She exhibits normal muscle tone. Coordination normal.  Skin: Skin is warm and dry.  Psychiatric: She has a normal mood and affect. Her behavior is normal. Judgment and thought content normal.  Breasts: Examined lying Right: Without masses, retractions, discharge or axillary adenopathy.  Left: Without masses, retractions, discharge or axillary adenopathy.  Inguinal/mons: Normal without inguinal adenopathy  Pelvic: Deferred          Assessment & Plan:       Assessment & Plan:   Problem List Items Addressed This Visit       Unprioritized   Preventative health care - Primary    Recommended tetanus/covid bivalent booster at pharmacy. She could not afford the copay on the Shingrix. High dose flu shot and Prevnar 20 today in office. Colo up to date. Mammo will be due this summer. Pt is advised  as follows:   Please schedule a routine eye exam and dental exam.  Add caltrate 631m twice daily for bone health. Try to add 30 minutes of walking 5 days a week.        Osteoporosis    Continue fosamax, not taking calcium. Add caltrate 6092m+D bid.       Relevant Medications   Cholecalciferol (VITAMIN D3) 10 MCG (400 UNIT) CAPS   Calcium Carbonate-Vit D-Min (CALTRATE 600+D PLUS MINERALS) 600-800 MG-UNIT TABS   Mixed hyperlipidemia    Stopped crestor due to nausea. Check lipid panel. If elevated, she is willing to trial a different statin.       Relevant Orders   Lipid panel   Hypertension    BP at goal. Continue toprol xl and lisinopril.       History of DVT (deep vein thrombosis)    Could not afford xarelto. She is taking 2 baby aspirin/day.  Continues to follow with hematology.       Essential hypertension    Stable. Continue current doses of toprol/lisinopril.       Relevant Orders   Comp Met (CMET)   B12 deficiency    Continues b12 injections 100020mIM monthly. Check follow up b12 level.       Relevant Orders   B12   Other Visit Diagnoses     Vitamin D deficiency       Relevant Orders   VITAMIN D 25 Hydroxy (Vit-D Deficiency, Fractures)       I have discontinued Ayahna L. Vecchiarelli's multivitamin with minerals and Calcium Carbonate-Vitamin D. I am also having her start on Boostrix and Caltrate 600+D Plus Minerals. Additionally, I am having her maintain her nitroGLYCERIN, rosuvastatin, alendronate, aspirin EC, metoprolol succinate, lisinopril, and Vitamin D3. We will continue to administer cyanocobalamin, cyanocobalamin, and cyanocobalamin.  Meds ordered this encounter  Medications   Tdap (BOOSTRIX) 5-2.5-18.5 LF-MCG/0.5 injection    Sig: Inject 0.5 mLs into the muscle once for 1 dose.    Dispense:  0.5 mL    Refill:  0    Change to TDAP okay per NP 10/20/21    Order Specific Question:   Supervising Provider    Answer:   BLYPenni Homans[[id number]]   Calcium Carbonate-Vit D-Min (CALTRATE 600+D PLUS MINERALS) 600-800 MG-UNIT TABS    Sig: Take 1 tablet by mouth twice daily    Order Specific Question:   Supervising Provider     Answer:   BLYPenni Homans[[id number]]

## 2021-10-20 NOTE — Assessment & Plan Note (Signed)
>>  ASSESSMENT AND PLAN FOR HISTORY OF DVT (DEEP VEIN THROMBOSIS) WRITTEN ON 10/20/2021 11:14 AM BY O'SULLIVAN, Talina Pleitez, NP  Could not afford xarelto. She is taking 2 baby aspirin/day.  Continues to follow with hematology.

## 2021-10-24 ENCOUNTER — Telehealth: Payer: Self-pay | Admitting: Family

## 2021-10-24 NOTE — Telephone Encounter (Signed)
Called but n/a. Lvm for patient to call back

## 2021-10-24 NOTE — Telephone Encounter (Signed)
Vitamin b12 and vit D are normal. Cholesterol has gone up a lot. Why did she stop crestor? Is she willing to restart? I would recommend she restart if she is able to tolerate it. OK to send refills please.

## 2021-10-25 ENCOUNTER — Ambulatory Visit: Payer: Medicare PPO | Attending: Internal Medicine

## 2021-10-25 ENCOUNTER — Ambulatory Visit (INDEPENDENT_AMBULATORY_CARE_PROVIDER_SITE_OTHER): Payer: Medicare PPO

## 2021-10-25 DIAGNOSIS — E538 Deficiency of other specified B group vitamins: Secondary | ICD-10-CM

## 2021-10-25 DIAGNOSIS — Z23 Encounter for immunization: Secondary | ICD-10-CM

## 2021-10-25 NOTE — Progress Notes (Signed)
Ruth Jensen is a 71 y.o. female  presents to the office today for a B12 injection, per physician's orders. ?Original order: per [name], NP ? ?cyanocobalamin, 1000 mg/ml IM was administered in right deltoid today.  ? ?Patient tolerated injection well.  ? ?Next appointment:  11/29/21 ?

## 2021-10-25 NOTE — Progress Notes (Signed)
? ?  Covid-[facility] Vaccination [facility] ? ?Name:  Ruth Jensen    ?MRN: [id number] ?DOB: 1951/08/22 ? ?10/25/2021 ? ?Ruth Jensen was observed post Covid-19 immunization for 15 minutes without incident. She was provided with Vaccine Information Sheet and instruction to access the V-Safe system.  ? ?Ruth Jensen was instructed to call 911 with any severe reactions post vaccine: ?Difficulty breathing  ?Swelling of face and throat  ?A fast heartbeat  ?A bad rash all over body  ?Dizziness and weakness  ? ?Immunizations Administered   ? ? Name Date Dose VIS Date Route  ? Ambulance person Booster 10/25/2021 10:24 AM 0.3 mL 04/26/2021 Intramuscular  ? Manufacturer: [place]: [phone]  ? [place]: [phone]  ? ?  ? ? ?

## 2021-10-26 MED ORDER — ATORVASTATIN CALCIUM 20 MG PO TABS: 20.0000 mg | ORAL_TABLET | Freq: Every day | ORAL | 1 refills | Status: DC

## 2021-10-26 NOTE — Telephone Encounter (Signed)
Patient advised of new prescription and to call if any side effects ?

## 2021-10-26 NOTE — Telephone Encounter (Signed)
Let's try atorvastatin instead.  Please take at bedtime. Let me know if she has problems with atorvastatin.  ?

## 2021-10-26 NOTE — Addendum Note (Signed)
Addended by: [name] on: 10/26/2021 06:00 AM ? ? Modules accepted: Orders ? ?

## 2021-10-27 ENCOUNTER — Other Ambulatory Visit (HOSPITAL_BASED_OUTPATIENT_CLINIC_OR_DEPARTMENT_OTHER): Payer: Self-pay

## 2021-10-27 MED ORDER — PFIZER COVID-19 VAC BIVALENT 30 MCG/0.3ML IM SUSP
INTRAMUSCULAR | 0 refills | Status: DC
  Filled 2021-10-27: qty 0.3, 1d supply, fill #0

## 2021-11-29 ENCOUNTER — Ambulatory Visit (INDEPENDENT_AMBULATORY_CARE_PROVIDER_SITE_OTHER): Payer: Medicare PPO

## 2021-11-29 DIAGNOSIS — E559 Vitamin D deficiency, unspecified: Secondary | ICD-10-CM

## 2021-11-29 MED ORDER — CYANOCOBALAMIN 1000 MCG/ML IJ SOLN
1000.0000 ug | Freq: Once | INTRAMUSCULAR | Status: AC
  Administered 2021-11-29: 1000 ug via INTRAMUSCULAR

## 2021-11-29 NOTE — Progress Notes (Signed)
Ruth Jensen is a 71 y.o. female  presents to the office today for a B12 injection, per physician's orders. ?Original order: per [name], NP ?  ?cyanocobalamin, 1000 mg/ml IM was administered in right deltoid today.  ?  ?Patient tolerated injection well.  ?  ?Next appointment:  01/04/2022 ?

## 2022-01-01 ENCOUNTER — Telehealth: Payer: Self-pay | Admitting: Family

## 2022-01-01 ENCOUNTER — Other Ambulatory Visit: Payer: Self-pay

## 2022-01-01 MED ORDER — LISINOPRIL 2.5 MG PO TABS: ORAL_TABLET | ORAL | 0 refills | Status: DC

## 2022-01-01 MED ORDER — ALENDRONATE SODIUM 70 MG PO TABS: ORAL_TABLET | ORAL | 11 refills | Status: DC

## 2022-01-01 NOTE — Telephone Encounter (Signed)
Prescriptions sent

## 2022-01-01 NOTE — Telephone Encounter (Signed)
Patient needs medication refilled  ? ?alendronate (FOSAMAX) 70 MG tablet ?lisinopril (ZESTRIL) 2.5 MG tablet ? ?[facility] [phone] ?

## 2022-01-04 ENCOUNTER — Ambulatory Visit (INDEPENDENT_AMBULATORY_CARE_PROVIDER_SITE_OTHER): Payer: Medicare PPO

## 2022-01-04 DIAGNOSIS — E538 Deficiency of other specified B group vitamins: Secondary | ICD-10-CM

## 2022-01-04 MED ORDER — CYANOCOBALAMIN 1000 MCG/ML IJ SOLN
1000.0000 ug | Freq: Once | INTRAMUSCULAR | Status: AC
  Administered 2022-01-04: 1000 ug via INTRAMUSCULAR

## 2022-01-04 NOTE — Progress Notes (Signed)
Ruth Jensen is a 71 y.o. female presents to the office today for B12 :  injections, per physician's orders. Original order: per [name] FNP cyanocobalamin (med), 1000 mg/ml (dose),  IM (route) was administered left deltoid (location) today . Patient tolerated injection.  Patient next injection scheduled for 02-06-22  Jiles Prows

## 2022-01-21 ENCOUNTER — Other Ambulatory Visit: Payer: Self-pay | Admitting: Family

## 2022-02-06 ENCOUNTER — Ambulatory Visit (INDEPENDENT_AMBULATORY_CARE_PROVIDER_SITE_OTHER): Payer: Medicare PPO

## 2022-02-06 DIAGNOSIS — E538 Deficiency of other specified B group vitamins: Secondary | ICD-10-CM

## 2022-02-06 NOTE — Progress Notes (Signed)
Ruth Jensen is a 71 y.o. female presents to the office today for B12 injection, per physician's orders. Original order: 10/24/21: "Vitamin b12 and vit D are normal. Cholesterol has gone up a lot. Why did she stop crestor? Is she willing to restart? I would recommend she restart if she is able to tolerate it. OK to send refills please. " Cyanocobalamin, 1000 mg/ml IM was administered in R deltoid today. Patient tolerated injection. Patient due for follow up labs/provider appt: No.  Patient next injection due: 1 month, appt made Yes  Creft, [name] L

## 2022-02-26 NOTE — Progress Notes (Unsigned)
Subjective:   Ruth Jensen is a 71 y.o. female who presents for Medicare Annual (Subsequent) preventive examination.  Review of Systems           Objective:    There were no vitals filed for this visit. There is no height or weight on file to calculate BMI.     06/26/2021   10:55 AM 02/22/2021   10:25 AM 01/30/2021    9:17 AM 01/06/2021    3:56 PM 06/16/2020    9:14 AM 12/07/2019   11:40 AM 10/15/2019    1:58 PM  Advanced Directives  Does Patient Have a Medical Advance Directive? No No Yes No No No No  Does patient want to make changes to medical advance directive?   No - Patient declined      Would patient like information on creating a medical advance directive? No - Patient declined Yes (MAU/Ambulatory/Procedural Areas - Information given)   No - Patient declined No - Patient declined No - Patient declined    Current Medications (verified) Outpatient Encounter Medications as of 02/28/2022  Medication Sig   alendronate (FOSAMAX) 70 MG tablet TAKE 1 TABLET(70 MG) BY MOUTH EVERY 7 DAYS WITH A FULL GLASS OF WATER AND ON AN EMPTY STOMACH   aspirin EC 81 MG tablet Take 162 mg by mouth daily. Swallow whole.   atorvastatin (LIPITOR) 20 MG tablet Take 1 tablet (20 mg total) by mouth daily.   Calcium Carbonate-Vit D-Min (CALTRATE 600+D PLUS MINERALS) 600-800 MG-UNIT TABS Take 1 tablet by mouth twice daily   Cholecalciferol (VITAMIN D3) 10 MCG (400 UNIT) CAPS Take by mouth.   COVID-19 mRNA bivalent vaccine, Pfizer, (PFIZER COVID-19 VAC BIVALENT) injection Inject into the muscle.   lisinopril (ZESTRIL) 2.5 MG tablet TAKE 1 TABLET(2.5 MG) BY MOUTH DAILY   metoprolol succinate (TOPROL-XL) 25 MG 24 hr tablet TAKE 1 TABLET(25 MG) BY MOUTH DAILY   nitroGLYCERIN (NITROSTAT) 0.4 MG SL tablet Place 1 tablet (0.4 mg total) under the tongue every 5 (five) minutes as needed.   Facility-Administered Encounter Medications as of 02/28/2022  Medication   cyanocobalamin ((VITAMIN B-12)) injection 1,000  mcg   cyanocobalamin ((VITAMIN B-12)) injection 1,000 mcg   cyanocobalamin ((VITAMIN B-12)) injection 1,000 mcg    Allergies (verified) Patient has no known allergies.   History: Past Medical History:  Diagnosis Date   Abnormal echocardiogram 07/22/2019   Abnormal EKG 07/22/2019   Acute deep vein thrombosis (DVT) of popliteal vein of right lower extremity ([place]) 07/01/2019   Acute pulmonary embolism ([place]) 07/02/2019   Chest pain 07/22/2019   Depressed left ventricular ejection fraction 07/22/2019   DVT (deep venous thrombosis) ([place]) 07/01/2019   History of DVT (deep vein thrombosis) 07/01/2019   History of pulmonary embolism 07/02/2019   Hypertension    Lipid screening 07/22/2019   Obesity (BMI 30-39.9) 07/22/2019   Osteoporosis 11/18/2019   Pulmonary embolism ([place]) 07/02/2019   Single subsegmental pulmonary embolism without acute cor pulmonale ([place]) 07/01/2019   Past Surgical History:  Procedure Laterality Date   ABDOMINAL HYSTERECTOMY  2010   due to fibroids   ABDOMINAL HYSTERECTOMY     BREAST BIOPSY Left    BREAST CYST EXCISION Left    COLONOSCOPY     [facility]. Late 2000's    LEFT HEART CATH AND CORONARY ANGIOGRAPHY N/A 08/03/2019   Procedure: LEFT HEART CATH AND CORONARY ANGIOGRAPHY;  Surgeon: Lorretta Harp, MD;  Location: [place] [facility];  Service: Cardiovascular;  Laterality: N/A;  OOPHORECTOMY     Family History  Problem Relation Age of Onset   Hypertension Mother    CVA Mother        hemiparesis   Hypertension Father    Dementia Father    Prostate cancer Father    Hypertension Sister    CAD Sister        scheduled for pacemaker   Hypertension Sister    Skin cancer Sister        melanoma died at age 25   Hypertension Sister        ? PFO repair   Parkinson's disease Sister    Asthma Paternal Grandfather    Hypertension Daughter    Hypertension Son        pacemaker   Colon cancer Neg Hx    Esophageal cancer Neg Hx    Social History    Socioeconomic History   Marital status: Married    Spouse name: Truman Hayward   Number of children: 2   Years of education: Not on file   Highest education level: Not on file  Occupational History   Occupation: retired   Tobacco Use   Smoking status: Never   Smokeless tobacco: Never  Vaping Use   Vaping Use: Never used  Substance and Sexual Activity   Alcohol use: Not Currently   Drug use: Never   Sexual activity: Not Currently  Other Topics Concern   Not on file  Social History Narrative   Married   2 children (son and daughter) live locally   4 grandchildren   Retired   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   No pets   Enjoys spending time with grandchildren, baseball games, walking   Social Determinants of Health   Financial Resource Strain: Low Risk  (02/22/2021)   Overall Financial Resource Strain (CARDIA)    Difficulty of Paying Living Expenses: Not hard at all  Food Insecurity: No Food Insecurity (02/22/2021)   Hunger Vital Sign    Worried About Running Out of Food in the Last Year: Never true    [place] in the Last Year: Never true  Transportation Needs: No Transportation Needs (02/22/2021)   PRAPARE - Hydrologist (Medical): No    Lack of Transportation (Non-Medical): No  Physical Activity: Inactive (02/22/2021)   Exercise Vital Sign    Days of Exercise per Week: 0 days    Minutes of Exercise per Session: 0 min  Stress: No Stress Concern Present (02/22/2021)   [place]    Feeling of Stress : Not at all  Social Connections: Moderately Integrated (02/22/2021)   Social Connection and Isolation Panel [NHANES]    Frequency of Communication with Friends and Family: More than three times a week    Frequency of Social Gatherings with Friends and Family: More than three times a week    Attends Religious Services: More than 4 times per year    Active Member of [employer] or  Organizations: No    Attends Archivist Meetings: Never    Marital Status: Married    Tobacco Counseling Counseling given: Not Answered   Clinical Intake:                 Diabetic?no         Activities of Daily Living     No data to display          Patient Care Team:  [name], NP as PCP - General (Internal Medicine) Berniece Salines, DO as PCP - Cardiology (Cardiology)  Indicate any recent Medical Services you may have received from other than [facility] providers in the past year (date may be approximate).     Assessment:   This is a routine wellness examination for [place].  Hearing/Vision screen No results found.  Dietary issues and exercise activities discussed:     Goals Addressed   None    Depression Screen    04/14/2021    1:57 PM 02/22/2021   10:29 AM 10/15/2019    2:03 PM  PHQ 2/9 Scores  PHQ - 2 Score 0 0 0    Fall Risk    04/14/2021    1:57 PM 02/22/2021   10:28 AM 10/15/2019    2:02 PM  [place] in the past year? 0 0 0  Number falls in past yr: 0 0 0  Injury with Fall? 0 0 0  Follow up  Falls prevention discussed Education provided;Falls prevention discussed    FALL RISK PREVENTION PERTAINING TO THE HOME:  Any stairs in or around the home? {YES/NO:21197} If so, are there any without handrails? {YES/NO:21197} Home free of loose throw rugs in walkways, pet beds, electrical cords, etc? {YES/NO:21197} Adequate lighting in your home to reduce risk of falls? {YES/NO:21197}  ASSISTIVE DEVICES UTILIZED TO PREVENT FALLS:  Life alert? {YES/NO:21197} Use of a cane, walker or w/c? {YES/NO:21197} Grab bars in the bathroom? {YES/NO:21197} Shower chair or bench in shower? {YES/NO:21197} Elevated toilet seat or a handicapped toilet? {YES/NO:21197}  TIMED UP AND GO:  Was the test performed? {YES/NO:21197}.  Length of time to ambulate 10 feet: *** sec.   {Appearance of XBDZ:3299242}  Cognitive Function:         Immunizations Immunization History  Administered Date(s) Administered   Fluad Quad(high Dose 65+) 07/14/2019, 05/27/2020, 10/20/2021   PFIZER(Purple Top)SARS-COV-2 Vaccination 11/07/2019, 11/28/2019, 06/17/2020   PNEUMOCOCCAL CONJUGATE-20 10/20/2021   Pfizer Covid-19 Vaccine Bivalent Booster 40yr & up 10/25/2021   Pneumococcal Polysaccharide-23 10/04/2020    {TDAP status:2101805}  Flu Vaccine status: Up to date  Pneumococcal vaccine status: Up to date  Covid-19 vaccine status: Completed vaccines  Qualifies for Shingles Vaccine? {YES/NO:21197}  Zostavax completed {YES/NO:21197}  {Shingrix Completed?:2101804}  Screening Tests Health Maintenance  Topic Date Due   TETANUS/TDAP  Never done   Zoster Vaccines- Shingrix (1 of 2) Never done   COVID-19 Vaccine (5 - Pfizer series) 02/24/2022   INFLUENZA VACCINE  03/27/2022   MAMMOGRAM  04/18/2023   COLONOSCOPY (Pts 45-423yrInsurance coverage will need to be confirmed)  01/11/2030   Pneumonia Vaccine 6525Years old  Completed   DEXA SCAN  Completed   Hepatitis C Screening  Completed   HPV VACCINES  Aged Out    Health Maintenance  Health Maintenance Due  Topic Date Due   TETANUS/TDAP  Never done   Zoster Vaccines- Shingrix (1 of 2) Never done   COVID-19 Vaccine (5 - Pfizer series) 02/24/2022    Colorectal cancer screening: Type of screening: Colonoscopy. Completed 01/12/20. Repeat every 10 years  Mammogram status: Completed 04/17/21. Repeat every year  {Bone Density status:21018021}  Lung Cancer Screening: (Low Dose CT Chest recommended if Age 344-80ears, 30 pack-year currently smoking OR have quit w/in 15years.) {DOES NOT does:27190::"does not"} qualify.   Lung Cancer Screening Referral: ***  Additional Screening:  Hepatitis C Screening: does qualify; Completed 04/14/21  Vision Screening: Recommended annual ophthalmology exams for early detection  of glaucoma and other disorders of the eye. Is the patient up to date  with their annual eye exam?  {YES/NO:21197} Who is the provider or what is the name of the office in which the patient attends annual eye exams? *** If pt is not established with a provider, would they like to be referred to a provider to establish care? {YES/NO:21197}.   Dental Screening: Recommended annual dental exams for proper oral hygiene  Community Resource Referral / Chronic Care Management: CRR required this visit?  {YES/NO:21197}  CCM required this visit?  {YES/NO:21197}     Plan:     I have personally reviewed and noted the following in the patient's chart:   Medical and social history Use of alcohol, tobacco or illicit drugs  Current medications and supplements including opioid prescriptions.  Functional ability and status Nutritional status Physical activity Advanced directives List of other physicians Hospitalizations, surgeries, and ER visits in previous 12 months Vitals Screenings to include cognitive, depression, and falls Referrals and appointments  In addition, I have reviewed and discussed with patient certain preventive protocols, quality metrics, and best practice recommendations. A written personalized care plan for preventive services as well as general preventive health recommendations were provided to patient.     Duard Brady Kirbi Farrugia, [place]   02/26/2022   Nurse Notes: ***

## 2022-02-28 ENCOUNTER — Ambulatory Visit (INDEPENDENT_AMBULATORY_CARE_PROVIDER_SITE_OTHER): Payer: Medicare PPO

## 2022-02-28 DIAGNOSIS — Z780 Asymptomatic menopausal state: Secondary | ICD-10-CM

## 2022-02-28 DIAGNOSIS — Z0000 Encounter for general adult medical examination without abnormal findings: Secondary | ICD-10-CM | POA: Diagnosis not present

## 2022-02-28 NOTE — Patient Instructions (Signed)
Ruth Jensen , Thank you for taking time to come for your Medicare Wellness Visit. I appreciate your ongoing commitment to your health goals. Please review the following plan we discussed and let me know if I can assist you in the future.   Screening recommendations/referrals: Colonoscopy: 01/12/20 due 01/11/30 Mammogram: 04/17/21 due 04/17/22 Bone Density: ordered 02/28/22 Recommended yearly ophthalmology/optometry visit for glaucoma screening and checkup Recommended yearly dental visit for hygiene and checkup  Vaccinations: Influenza vaccine: up to date Pneumococcal vaccine: up to date Tdap vaccine: Due-May obtain vaccine at your local pharmacy.  Shingles vaccine: up to date   Covid-19:Due-May obtain vaccine at your local pharmacy.   Advanced directives: no  Conditions/risks identified: see problem list   Next appointment: Follow up in one year for your annual wellness visit 03/04/23   Preventive Care 72 Years and Older, Female Preventive care refers to lifestyle choices and visits with your health care provider that can promote health and wellness. What does preventive care include? A yearly physical exam. This is also called an annual well check. Dental exams once or twice a year. Routine eye exams. Ask your health care provider how often you should have your eyes checked. Personal lifestyle choices, including: Daily care of your teeth and gums. Regular physical activity. Eating a healthy diet. Avoiding tobacco and drug use. Limiting alcohol use. Practicing safe sex. Taking low-dose aspirin every day. Taking vitamin and mineral supplements as recommended by your health care provider. What happens during an annual well check? The services and screenings done by your health care provider during your annual well check will depend on your age, overall health, lifestyle risk factors, and family history of disease. Counseling  Your health care provider may ask you questions about  your: Alcohol use. Tobacco use. Drug use. Emotional well-being. Home and relationship well-being. Sexual activity. Eating habits. History of falls. Memory and ability to understand (cognition). Work and work Statistician. Reproductive health. Screening  You may have the following tests or measurements: Height, weight, and BMI. Blood pressure. Lipid and cholesterol levels. These may be checked every 5 years, or more frequently if you are over 72 years old. Skin check. Lung cancer screening. You may have this screening every year starting at age 72 if you have a 30-pack-year history of smoking and currently smoke or have quit within the past 15 years. Fecal occult blood test (FOBT) of the stool. You may have this test every year starting at age 72. Flexible sigmoidoscopy or colonoscopy. You may have a sigmoidoscopy every 5 years or a colonoscopy every 10 years starting at age 72. Hepatitis C blood test. Hepatitis B blood test. Sexually transmitted disease (STD) testing. Diabetes screening. This is done by checking your blood sugar (glucose) after you have not eaten for a while (fasting). You may have this done every 1-3 years. Bone density scan. This is done to screen for osteoporosis. You may have this done starting at age 72. Mammogram. This may be done every 1-2 years. Talk to your health care provider about how often you should have regular mammograms. Talk with your health care provider about your test results, treatment options, and if necessary, the need for more tests. Vaccines  Your health care provider may recommend certain vaccines, such as: Influenza vaccine. This is recommended every year. Tetanus, diphtheria, and acellular pertussis (Tdap, Td) vaccine. You may need a Td booster every 10 years. Zoster vaccine. You may need this after age 72. Pneumococcal 13-valent conjugate (PCV13) vaccine. One dose  is recommended after age 72. Pneumococcal polysaccharide (PPSV23) vaccine.  One dose is recommended after age 72. Talk to your health care provider about which screenings and vaccines you need and how often you need them. This information is not intended to replace advice given to you by your health care provider. Make sure you discuss any questions you have with your health care provider. Document Released: 09/09/2015 Document Revised: 05/02/2016 Document Reviewed: 06/14/2015 Elsevier Interactive Patient Education  2017 [place] Prevention in the Home Falls can cause injuries. They can happen to people of all ages. There are many things you can do to make your home safe and to help prevent falls. What can I do on the outside of my home? Regularly fix the edges of walkways and driveways and fix any cracks. Remove anything that might make you trip as you walk through a door, such as a raised step or threshold. Trim any bushes or trees on the path to your home. Use bright outdoor lighting. Clear any walking paths of anything that might make someone trip, such as rocks or tools. Regularly check to see if handrails are loose or broken. Make sure that both sides of any steps have handrails. Any raised decks and porches should have guardrails on the edges. Have any leaves, snow, or ice cleared regularly. Use sand or salt on walking paths during winter. Clean up any spills in your garage right away. This includes oil or grease spills. What can I do in the bathroom? Use night lights. Install grab bars by the toilet and in the tub and shower. Do not use towel bars as grab bars. Use non-skid mats or decals in the tub or shower. If you need to sit down in the shower, use a plastic, non-slip stool. Keep the floor dry. Clean up any water that spills on the floor as soon as it happens. Remove soap buildup in the tub or shower regularly. Attach bath mats securely with double-sided non-slip rug tape. Do not have throw rugs and other things on the floor that can make  you trip. What can I do in the bedroom? Use night lights. Make sure that you have a light by your bed that is easy to reach. Do not use any sheets or blankets that are too big for your bed. They should not hang down onto the floor. Have a firm chair that has side arms. You can use this for support while you get dressed. Do not have throw rugs and other things on the floor that can make you trip. What can I do in the kitchen? Clean up any spills right away. Avoid walking on wet floors. Keep items that you use a lot in easy-to-reach places. If you need to reach something above you, use a strong step stool that has a grab bar. Keep electrical cords out of the way. Do not use floor polish or wax that makes floors slippery. If you must use wax, use non-skid floor wax. Do not have throw rugs and other things on the floor that can make you trip. What can I do with my stairs? Do not leave any items on the stairs. Make sure that there are handrails on both sides of the stairs and use them. Fix handrails that are broken or loose. Make sure that handrails are as long as the stairways. Check any carpeting to make sure that it is firmly attached to the stairs. Fix any carpet that is loose or worn. Avoid  having throw rugs at the top or bottom of the stairs. If you do have throw rugs, attach them to the floor with carpet tape. Make sure that you have a light switch at the top of the stairs and the bottom of the stairs. If you do not have them, ask someone to add them for you. What else can I do to help prevent falls? Wear shoes that: Do not have high heels. Have rubber bottoms. Are comfortable and fit you well. Are closed at the toe. Do not wear sandals. If you use a stepladder: Make sure that it is fully opened. Do not climb a closed stepladder. Make sure that both sides of the stepladder are locked into place. Ask someone to hold it for you, if possible. Clearly mark and make sure that you can  see: Any grab bars or handrails. First and last steps. Where the edge of each step is. Use tools that help you move around (mobility aids) if they are needed. These include: Canes. Walkers. Scooters. Crutches. Turn on the lights when you go into a dark area. Replace any light bulbs as soon as they burn out. Set up your furniture so you have a clear path. Avoid moving your furniture around. If any of your floors are uneven, fix them. If there are any pets around you, be aware of where they are. Review your medicines with your doctor. Some medicines can make you feel dizzy. This can increase your chance of falling. Ask your doctor what other things that you can do to help prevent falls. This information is not intended to replace advice given to you by your health care provider. Make sure you discuss any questions you have with your health care provider. Document Released: 06/09/2009 Document Revised: 01/19/2016 Document Reviewed: 09/17/2014 Elsevier Interactive Patient Education  2017 [employer].

## 2022-03-08 ENCOUNTER — Ambulatory Visit (INDEPENDENT_AMBULATORY_CARE_PROVIDER_SITE_OTHER): Payer: Medicare PPO

## 2022-03-08 DIAGNOSIS — E538 Deficiency of other specified B group vitamins: Secondary | ICD-10-CM | POA: Diagnosis not present

## 2022-03-08 MED ORDER — CYANOCOBALAMIN 1000 MCG/ML IJ SOLN
1000.0000 ug | Freq: Once | INTRAMUSCULAR | Status: AC
  Administered 2022-03-08: 1000 ug via INTRAMUSCULAR

## 2022-03-08 NOTE — Progress Notes (Signed)
Ruth Jensen is a 71 y.o. female presents to the office today for B12:  injections, per physician's orders. Original order: Per [name] FNP  Cyanocobalamin (med), 1000 mg/ml (dose),  im (route) was administered left deltoid (location) today. Patient tolerated injection.  Patient next injection due: in one month, appt made Yes 04-10-22.  Jiles Prows

## 2022-03-12 ENCOUNTER — Other Ambulatory Visit (HOSPITAL_BASED_OUTPATIENT_CLINIC_OR_DEPARTMENT_OTHER): Payer: Self-pay | Admitting: Family

## 2022-03-12 DIAGNOSIS — Z1231 Encounter for screening mammogram for malignant neoplasm of breast: Secondary | ICD-10-CM

## 2022-04-02 ENCOUNTER — Other Ambulatory Visit: Payer: Self-pay

## 2022-04-02 ENCOUNTER — Telehealth: Payer: Self-pay | Admitting: Family

## 2022-04-02 MED ORDER — LISINOPRIL 2.5 MG PO TABS: ORAL_TABLET | ORAL | 0 refills | Status: DC

## 2022-04-02 NOTE — Telephone Encounter (Signed)
Medication:   lisinopril (ZESTRIL) 2.5 MG tablet [[id number]]   Has the patient contacted their pharmacy? No. (If no, request that the patient contact the pharmacy for the refill.) (If yes, when and what did the pharmacy advise?)  Preferred Pharmacy (with phone number or street name):   [id number] - [place], [place] - [address]  [address], [place] [address]  Phone:  [phone]  Fax:  [phone]   Agent: Please be advised that RX refills may take up to 3 business days. We ask that you follow-up with your pharmacy.

## 2022-04-10 ENCOUNTER — Ambulatory Visit (INDEPENDENT_AMBULATORY_CARE_PROVIDER_SITE_OTHER): Payer: Medicare PPO

## 2022-04-10 DIAGNOSIS — E538 Deficiency of other specified B group vitamins: Secondary | ICD-10-CM

## 2022-04-10 MED ORDER — CYANOCOBALAMIN 1000 MCG/ML IJ SOLN
1000.0000 ug | Freq: Once | INTRAMUSCULAR | Status: AC
  Administered 2022-04-10: 1000 ug via INTRAMUSCULAR

## 2022-04-10 NOTE — Progress Notes (Signed)
Ruth Jensen is a 71 y.o. female presents to the office today for B12:  injections, per physician's orders. Original order: Per [name] FNP Cyanocobalamin (med), 1000 mg/ml (dose),  Im (route) was administered left deltoid (location) today. Patient tolerated injection. Patient due for follow up labs/provider appt: Yes. Date due: 05-07-2022, appt made Yes Patient next injection due: in one month, appt made Not at this time, will be determine at [facility].   Jiles Prows

## 2022-04-20 ENCOUNTER — Ambulatory Visit: Payer: Medicare PPO | Admitting: Family

## 2022-04-23 ENCOUNTER — Ambulatory Visit: Payer: Medicare PPO | Admitting: Family

## 2022-04-24 ENCOUNTER — Other Ambulatory Visit: Payer: Self-pay | Admitting: Family

## 2022-04-25 DIAGNOSIS — R1110 Vomiting, unspecified: Secondary | ICD-10-CM | POA: Diagnosis not present

## 2022-04-25 DIAGNOSIS — R0902 Hypoxemia: Secondary | ICD-10-CM | POA: Diagnosis not present

## 2022-04-25 DIAGNOSIS — R42 Dizziness and giddiness: Secondary | ICD-10-CM | POA: Diagnosis not present

## 2022-04-25 DIAGNOSIS — N390 Urinary tract infection, site not specified: Secondary | ICD-10-CM | POA: Diagnosis not present

## 2022-04-26 DIAGNOSIS — R42 Dizziness and giddiness: Secondary | ICD-10-CM | POA: Diagnosis not present

## 2022-05-01 ENCOUNTER — Encounter (HOSPITAL_BASED_OUTPATIENT_CLINIC_OR_DEPARTMENT_OTHER): Payer: Self-pay

## 2022-05-01 ENCOUNTER — Ambulatory Visit (HOSPITAL_BASED_OUTPATIENT_CLINIC_OR_DEPARTMENT_OTHER)
Admission: RE | Admit: 2022-05-01 | Discharge: 2022-05-01 | Disposition: A | Discharge status: Home / Self Care | Payer: Medicare PPO | Source: Ambulatory Visit | Attending: Family | Admitting: Family

## 2022-05-01 DIAGNOSIS — Z1231 Encounter for screening mammogram for malignant neoplasm of breast: Secondary | ICD-10-CM | POA: Insufficient documentation

## 2022-05-01 DIAGNOSIS — Z780 Asymptomatic menopausal state: Secondary | ICD-10-CM | POA: Diagnosis not present

## 2022-05-01 DIAGNOSIS — M8589 Other specified disorders of bone density and structure, multiple sites: Secondary | ICD-10-CM | POA: Diagnosis not present

## 2022-05-03 ENCOUNTER — Telehealth: Payer: Self-pay | Admitting: Family

## 2022-05-03 DIAGNOSIS — M810 Age-related osteoporosis without current pathological fracture: Secondary | ICD-10-CM

## 2022-05-03 NOTE — Telephone Encounter (Signed)
See letter.

## 2022-05-04 NOTE — Telephone Encounter (Signed)
Letter mailed out to pt

## 2022-05-07 ENCOUNTER — Ambulatory Visit (INDEPENDENT_AMBULATORY_CARE_PROVIDER_SITE_OTHER): Payer: Medicare PPO | Admitting: Family

## 2022-05-07 VITALS — BP 135/72 | HR 66 | Temp 98.2°F | Resp 16 | Wt 193.0 lb

## 2022-05-07 DIAGNOSIS — E538 Deficiency of other specified B group vitamins: Secondary | ICD-10-CM

## 2022-05-07 DIAGNOSIS — Z8673 Personal history of transient ischemic attack (TIA), and cerebral infarction without residual deficits: Secondary | ICD-10-CM | POA: Diagnosis not present

## 2022-05-07 DIAGNOSIS — D500 Iron deficiency anemia secondary to blood loss (chronic): Secondary | ICD-10-CM | POA: Insufficient documentation

## 2022-05-07 DIAGNOSIS — E782 Mixed hyperlipidemia: Secondary | ICD-10-CM | POA: Diagnosis not present

## 2022-05-07 DIAGNOSIS — E876 Hypokalemia: Secondary | ICD-10-CM

## 2022-05-07 DIAGNOSIS — H8110 Benign paroxysmal vertigo, unspecified ear: Secondary | ICD-10-CM | POA: Diagnosis not present

## 2022-05-07 DIAGNOSIS — G4762 Sleep related leg cramps: Secondary | ICD-10-CM | POA: Insufficient documentation

## 2022-05-07 DIAGNOSIS — Z23 Encounter for immunization: Secondary | ICD-10-CM | POA: Diagnosis not present

## 2022-05-07 DIAGNOSIS — I10 Essential (primary) hypertension: Secondary | ICD-10-CM

## 2022-05-07 LAB — BASIC METABOLIC PANEL
BUN: 14 mg/dL (ref 6–23)
CO2: 29 mEq/L (ref 19–32)
Calcium: 10 mg/dL (ref 8.4–10.5)
Chloride: 104 mEq/L (ref 96–112)
Creatinine, Ser: 0.83 mg/dL (ref 0.40–1.20)
GFR: 70.44 mL/min (ref >60.00)
Glucose, Bld: 88 mg/dL (ref 70–99)
Potassium: 4.2 mEq/L (ref 3.5–5.1)
Sodium: 141 mEq/L (ref 135–145)

## 2022-05-07 LAB — LIPID PANEL
Cholesterol: 176 mg/dL (ref 0–200)
HDL: 53.9 mg/dL (ref >39.00)
LDL Cholesterol: 105 mg/dL — ABNORMAL HIGH (ref 0–99)
NonHDL: 121.97
Total CHOL/HDL Ratio: 3
Triglycerides: 87 mg/dL (ref 0.0–149.0)
VLDL: 17.4 mg/dL (ref 0.0–40.0)

## 2022-05-07 MED ORDER — CYANOCOBALAMIN 1000 MCG/ML IJ SOLN
1000.0000 ug | Freq: Once | INTRAMUSCULAR | Status: AC
  Administered 2022-05-07: 1000 ug via INTRAMUSCULAR

## 2022-05-07 NOTE — Assessment & Plan Note (Addendum)
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 No acute finding  Continue aspirin, statin, strict bp control. She is a non-smoker.  Check carotid duplex and echo.

## 2022-05-07 NOTE — Assessment & Plan Note (Signed)
Clinically resolved.  Monitor.  

## 2022-05-07 NOTE — Assessment & Plan Note (Signed)
Taking atorvastatin, last lipid panel was elevated. Repeat today.

## 2022-05-07 NOTE — Assessment & Plan Note (Signed)
Continue b12 injections. Follow up b12 level is normal.

## 2022-05-07 NOTE — Assessment & Plan Note (Addendum)
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 Check iron studies.

## 2022-05-07 NOTE — Progress Notes (Signed)
Subjective:   By signing my name below, I, Shehryar Baig, attest that this documentation has been prepared under the direction and in the presence of [name], NP 05/07/2022    Patient ID: Ruth Jensen, female    DOB: 1951/01/07, 71 y.o.   MRN: [id number]  Chief Complaint  Patient presents with   Hypertension    Here for follow up   Dizziness    Diagnosed with vertigo 04/26/22 at ED    Patient is in today for a follow up visit.   Dizziness- She reports having a episode of dizziness after while eating at home after turning her head on 04/26/2022. Her son noticed her symptoms and asked her to turn the other way and her symptoms worsened. Her son has a history of vertigo. After her symptoms worsened she had to close her eyes to find balance. She then went upstairs after holding on to the wall and found she had nausea and soon after vomited. She then went to the ED and stayed overnight at a hospital.  While in the hospital she was given a Head CT without contrast and found Left superior frontal low-density since 2019, likely interval small-vessel infarct. No acute finding. (ED record is reviewed in care everywhere).   Leg cramps- She complains of leg cramps that present at night while laying sleeping or laying in bed. Her cramps occasionally wake her up from sleep.   Blood pressure- Her blood pressure is elevated during this visit. She continues taking 2.5 mg lisinopril daily PO, 25 mg metoprolol succinate daily PO and reports no new issues while taking it.  BP Readings from Last 3 Encounters:  05/07/22 135/72  10/20/21 (!) 124/54  06/26/21 (!) 145/62   Pulse Readings from Last 3 Encounters:  05/07/22 66  10/20/21 74  06/26/21 85    Past Medical History:  Diagnosis Date   Abnormal echocardiogram 07/22/2019   Abnormal EKG 07/22/2019   Acute deep vein thrombosis (DVT) of popliteal vein of right lower extremity ([place]) 07/01/2019   Acute pulmonary embolism ([place]) 07/02/2019    Chest pain 07/22/2019   Depressed left ventricular ejection fraction 07/22/2019   DVT (deep venous thrombosis) ([place]) 07/01/2019   History of DVT (deep vein thrombosis) 07/01/2019   History of pulmonary embolism 07/02/2019   Hypertension    Lipid screening 07/22/2019   Obesity (BMI 30-39.9) 07/22/2019   Osteoporosis 11/18/2019   Pulmonary embolism ([place]) 07/02/2019   Single subsegmental pulmonary embolism without acute cor pulmonale ([place]) 07/01/2019    Past Surgical History:  Procedure Laterality Date   ABDOMINAL HYSTERECTOMY  2010   due to fibroids   ABDOMINAL HYSTERECTOMY     BREAST BIOPSY Left    BREAST CYST EXCISION Left    COLONOSCOPY     [facility]. Late 2000's    LEFT HEART CATH AND CORONARY ANGIOGRAPHY N/A 08/03/2019   Procedure: LEFT HEART CATH AND CORONARY ANGIOGRAPHY;  Surgeon: Lorretta Harp, MD;  Location: [facility];  Service: Cardiovascular;  Laterality: N/A;   OOPHORECTOMY      Family History  Problem Relation Age of Onset   Hypertension Mother    CVA Mother        hemiparesis   Hypertension Father    Dementia Father    Prostate cancer Father    Hypertension Sister    CAD Sister        scheduled for pacemaker   Hypertension Sister    Skin cancer Sister  melanoma died at age 65   Hypertension Sister        ? PFO repair   Parkinson's disease Sister    Asthma Paternal Grandfather    Hypertension Daughter    Hypertension Son        pacemaker   Colon cancer Neg Hx    Esophageal cancer Neg Hx     Social History   Socioeconomic History   Marital status: Married    Spouse name: Truman Hayward   Number of children: 2   Years of education: Not on file   Highest education level: Not on file  Occupational History   Occupation: retired   Tobacco Use   Smoking status: Never   Smokeless tobacco: Never  Vaping Use   Vaping Use: Never used  Substance and Sexual Activity   Alcohol use: Not Currently   Drug use: Never   Sexual activity: Not  Currently  Other Topics Concern   Not on file  Social History Narrative   Married   2 children (son and daughter) live locally   4 grandchildren   Retired   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   No pets   Enjoys spending time with grandchildren, baseball games, walking   Social Determinants of Health   Financial Resource Strain: Low Risk  (02/22/2021)   Overall Financial Resource Strain (CARDIA)    Difficulty of Paying Living Expenses: Not hard at all  Food Insecurity: No Food Insecurity (02/22/2021)   Hunger Vital Sign    Worried About Running Out of Food in the Last Year: Never true    [place] in the Last Year: Never true  Transportation Needs: No Transportation Needs (02/22/2021)   PRAPARE - Hydrologist (Medical): No    Lack of Transportation (Non-Medical): No  Physical Activity: Inactive (02/22/2021)   Exercise Vital Sign    Days of Exercise per Week: 0 days    Minutes of Exercise per Session: 0 min  Stress: No Stress Concern Present (02/22/2021)   [place]    Feeling of Stress : Not at all  Social Connections: Moderately Integrated (02/22/2021)   Social Connection and Isolation Panel [NHANES]    Frequency of Communication with Friends and Family: More than three times a week    Frequency of Social Gatherings with Friends and Family: More than three times a week    Attends Religious Services: More than 4 times per year    Active Member of [employer] or Organizations: No    Attends Archivist Meetings: Never    Marital Status: Married  Human resources officer Violence: Not At Risk (02/22/2021)   Humiliation, Afraid, Rape, and Kick questionnaire    Fear of Current or Ex-Partner: No    Emotionally Abused: No    Physically Abused: No    Sexually Abused: No    Outpatient Medications Prior to Visit  Medication Sig Dispense Refill   alendronate (FOSAMAX) 70 MG tablet  TAKE 1 TABLET(70 MG) BY MOUTH EVERY 7 DAYS WITH A FULL GLASS OF WATER AND ON AN EMPTY STOMACH 4 tablet 11   aspirin EC 81 MG tablet Take 162 mg by mouth daily. Swallow whole.     atorvastatin (LIPITOR) 20 MG tablet Take 1 tablet (20 mg total) by mouth daily. 90 tablet 1   Calcium Carbonate-Vit D-Min (CALTRATE 600+D PLUS MINERALS) 600-800 MG-UNIT TABS Take 1 tablet by mouth twice daily  Cholecalciferol (VITAMIN D3) 10 MCG (400 UNIT) CAPS Take by mouth.     COVID-19 mRNA bivalent vaccine, Pfizer, (PFIZER COVID-19 VAC BIVALENT) injection Inject into the muscle. 0.3 mL 0   lisinopril (ZESTRIL) 2.5 MG tablet TAKE 1 TABLET(2.5 MG) BY MOUTH DAILY 90 tablet 0   metoprolol succinate (TOPROL-XL) 25 MG 24 hr tablet TAKE 1 TABLET(25 MG) BY MOUTH DAILY 90 tablet 1   nitroGLYCERIN (NITROSTAT) 0.4 MG SL tablet Place 1 tablet (0.4 mg total) under the tongue every 5 (five) minutes as needed. 30 tablet 3   Facility-Administered Medications Prior to Visit  Medication Dose Route Frequency Provider Last Rate Last Admin   cyanocobalamin ((VITAMIN B-12)) injection 1,000 mcg  1,000 mcg Intramuscular Q30 days [name], NP   1,000 mcg at 03/03/21 1040   cyanocobalamin ((VITAMIN B-12)) injection 1,000 mcg  1,000 mcg Intramuscular Once [name], NP       cyanocobalamin ((VITAMIN B-12)) injection 1,000 mcg  1,000 mcg Intramuscular Q30 days [name], NP   1,000 mcg at 02/06/22 1005    No Known Allergies  Review of Systems  Musculoskeletal:        (+)leg cramps  Neurological:  Positive for dizziness.       Objective:    Physical Exam Constitutional:      General: She is not in acute distress.    Appearance: Normal appearance. She is not ill-appearing.  HENT:     Head: Normocephalic and atraumatic.     Right Ear: Tympanic membrane, ear canal and external ear normal.     Left Ear: Tympanic membrane, ear canal and external ear normal.  Eyes:     Extraocular Movements:  Extraocular movements intact.     Pupils: Pupils are equal, round, and reactive to light.  Cardiovascular:     Rate and Rhythm: Normal rate and regular rhythm.     Pulses:          Dorsalis pedis pulses are 2+ on the right side and 2+ on the left side.       Posterior tibial pulses are 2+ on the right side and 2+ on the left side.     Heart sounds: Normal heart sounds. No murmur heard.    No gallop.     Comments: Her blood pressure measured 135/72 during manual recheck Pulmonary:     Effort: Pulmonary effort is normal. No respiratory distress.     Breath sounds: Normal breath sounds. No wheezing or rales.  Skin:    General: Skin is warm and dry.  Neurological:     Mental Status: She is alert and oriented to person, place, and time.  Psychiatric:        Judgment: Judgment normal.     BP 135/72   Pulse 66   Temp 98.2 F (36.8 C) (Oral)   Resp 16   Wt 193 lb (87.5 kg)   SpO2 100%   BMI 35.30 kg/m  Wt Readings from Last 3 Encounters:  05/07/22 193 lb (87.5 kg)  10/20/21 187 lb (84.8 kg)  06/26/21 193 lb (87.5 kg)      Assessment & Plan:   Problem List Items Addressed This Visit       Unprioritized   Nocturnal leg cramps    Recommend increased hydration, stretching before bed. Call if symptoms worsen or do not improve.       Mixed hyperlipidemia    Taking atorvastatin, last lipid panel was elevated. Repeat today.  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 Check iron studies.      Relevant Orders   Iron, TIBC and Ferritin Panel   History of CVA (cerebrovascular accident)    Incidental finding on CT scan 04/26/22:  IMPRESSION: Left superior frontal low-density since 2019, likely interval small-vessel infarct. No acute finding  Continue aspirin, statin, strict bp control. She is a non-smoker.  Check carotid duplex and echo.        Relevant Orders   US  Carotid Duplex Bilateral   ECHOCARDIOGRAM COMPLETE   Essential hypertension    Initial bp was elevated today but follow up BP improved.  BP Readings from Last 3 Encounters:  05/07/22 135/72  10/20/21 (!) 124/54  06/26/21 (!) 145/62  Continue metoprolol and lisinopril.        Benign paroxysmal positional vertigo    Clinically resolved.  Monitor.       B12 deficiency - Primary    Continue b12 injections. Follow up b12 level is normal.       Other Visit Diagnoses     Needs flu shot       Relevant Orders   Flu Vaccine QUAD High Dose(Fluad) (Completed)   Hypokalemia       Relevant Orders   Basic metabolic panel        Meds ordered this encounter  Medications   cyanocobalamin (VITAMIN B12) injection 1,000 mcg    I, Nance Pear, NP, personally preformed the services described in this documentation.  All medical record entries made by the scribe were at my direction and in my presence.  I have reviewed the chart and discharge instructions (if applicable) and agree that the record reflects my personal performance and is accurate and complete. 05/07/2022   I,Shehryar Baig,acting as a Education administrator for Nance Pear, NP.,have documented all relevant documentation on the behalf of Nance Pear, NP,as directed by  Nance Pear, NP while in the presence of Nance Pear, NP.   Nance Pear, NP

## 2022-05-07 NOTE — Assessment & Plan Note (Signed)
Recommend increased hydration, stretching before bed. Call if symptoms worsen or do not improve.

## 2022-05-07 NOTE — Assessment & Plan Note (Signed)
Initial bp was elevated today but follow up BP improved.  BP Readings from Last 3 Encounters:  05/07/22 135/72  10/20/21 (!) 124/54  06/26/21 (!) 145/62   Continue metoprolol and lisinopril.

## 2022-05-08 ENCOUNTER — Telehealth: Payer: Self-pay | Admitting: Family

## 2022-05-08 LAB — IRON,TIBC AND FERRITIN PANEL
%SAT: 18 % (calc) (ref 16–45)
Ferritin: 8 ng/mL — ABNORMAL LOW (ref 16–288)
Iron: 66 ug/dL (ref 45–160)
TIBC: 374 mcg/dL (calc) (ref 250–450)

## 2022-05-08 MED ORDER — IRON (FERROUS SULFATE) 325 (65 FE) MG PO TABS: 325.0000 mg | ORAL_TABLET | ORAL | Status: AC

## 2022-05-08 NOTE — Telephone Encounter (Signed)
Cholesterol is much better, please continue atorvastatin.  Iron is low. Please add iron '325mg'$  PO once every other day.

## 2022-05-09 NOTE — Telephone Encounter (Signed)
Patient advised of results and provider's comments 

## 2022-05-10 ENCOUNTER — Ambulatory Visit (HOSPITAL_BASED_OUTPATIENT_CLINIC_OR_DEPARTMENT_OTHER)
Admission: RE | Admit: 2022-05-10 | Discharge: 2022-05-10 | Disposition: A | Discharge status: Home / Self Care | Payer: Medicare PPO | Source: Ambulatory Visit | Attending: Family | Admitting: Family

## 2022-05-10 DIAGNOSIS — Z8673 Personal history of transient ischemic attack (TIA), and cerebral infarction without residual deficits: Secondary | ICD-10-CM | POA: Insufficient documentation

## 2022-05-10 DIAGNOSIS — I6523 Occlusion and stenosis of bilateral carotid arteries: Secondary | ICD-10-CM | POA: Diagnosis not present

## 2022-05-10 DIAGNOSIS — I499 Cardiac arrhythmia, unspecified: Secondary | ICD-10-CM | POA: Diagnosis not present

## 2022-05-11 ENCOUNTER — Telehealth: Payer: Self-pay | Admitting: Family

## 2022-05-11 DIAGNOSIS — I499 Cardiac arrhythmia, unspecified: Secondary | ICD-10-CM

## 2022-05-11 NOTE — Telephone Encounter (Signed)
Patient advised of results and to follow up with Dr. Harriet Masson, she reports she has an appointment with him coming up.

## 2022-05-11 NOTE — Telephone Encounter (Signed)
Please advise pt that there was no significant blockage noted on the ultrasound of her carotid arteries.  They did note some irregular pulse.  I would like to get her back in with Dr. Harriet Masson to check on this.

## 2022-05-14 ENCOUNTER — Encounter: Payer: Self-pay | Admitting: Family

## 2022-05-14 ENCOUNTER — Ambulatory Visit (HOSPITAL_BASED_OUTPATIENT_CLINIC_OR_DEPARTMENT_OTHER)
Admission: RE | Admit: 2022-05-14 | Discharge: 2022-05-14 | Disposition: A | Discharge status: Home / Self Care | Payer: Medicare PPO | Source: Ambulatory Visit | Attending: Family | Admitting: Family

## 2022-05-14 DIAGNOSIS — G459 Transient cerebral ischemic attack, unspecified: Secondary | ICD-10-CM | POA: Diagnosis not present

## 2022-05-14 DIAGNOSIS — Z8673 Personal history of transient ischemic attack (TIA), and cerebral infarction without residual deficits: Secondary | ICD-10-CM | POA: Insufficient documentation

## 2022-05-14 DIAGNOSIS — I340 Nonrheumatic mitral (valve) insufficiency: Secondary | ICD-10-CM | POA: Diagnosis not present

## 2022-05-14 LAB — ECHOCARDIOGRAM COMPLETE
AR max vel: 1.86 cm2
AV Area VTI: 1.88 cm2
AV Area mean vel: 1.83 cm2
AV Mean grad: 4 mmHg
AV Peak grad: 7 mmHg
Ao pk vel: 1.32 m/s
Area-P 1/2: 3.1 cm2
S' Lateral: 2.8 cm

## 2022-05-14 NOTE — Progress Notes (Unsigned)
Cardiology Clinic Note   Patient Name: Ruth Jensen Date of Encounter: 05/15/2022  Primary Care Provider:  [name], NP Primary Cardiologist:  Berniece Salines, DO  Patient Profile    Ruth Jensen 71 year old female presents to the clinic today for evaluation of irregular heartbeat.  Past Medical History    Past Medical History:  Diagnosis Date   Abnormal echocardiogram 07/22/2019   Abnormal EKG 07/22/2019   Acute deep vein thrombosis (DVT) of popliteal vein of right lower extremity ([place]) 07/01/2019   Acute pulmonary embolism ([place]) 07/02/2019   Chest pain 07/22/2019   Depressed left ventricular ejection fraction 24/40/1027   Diastolic dysfunction    DVT (deep venous thrombosis) ([place]) 07/01/2019   History of DVT (deep vein thrombosis) 07/01/2019   History of pulmonary embolism 07/02/2019   Hypertension    Lipid screening 07/22/2019   Obesity (BMI 30-39.9) 07/22/2019   Osteoporosis 11/18/2019   Pulmonary embolism ([place]) 07/02/2019   Single subsegmental pulmonary embolism without acute cor pulmonale ([place]) 07/01/2019   Past Surgical History:  Procedure Laterality Date   ABDOMINAL HYSTERECTOMY  2010   due to fibroids   BREAST BIOPSY Left    BREAST CYST EXCISION Left    COLONOSCOPY     [facility]. Late 2000's    LEFT HEART CATH AND CORONARY ANGIOGRAPHY N/A 08/03/2019   Procedure: LEFT HEART CATH AND CORONARY ANGIOGRAPHY;  Surgeon: Lorretta Harp, MD;  Location: [facility];  Service: Cardiovascular;  Laterality: N/A;   mitral valve regurg  2023   moderat per 2D echo   OOPHORECTOMY      Allergies  No Known Allergies  History of Present Illness    [name] is a PMH of essential hypertension, mixed hyperlipidemia, obesity, ischemic cardiomyopathy, and vertigo.  Echocardiogram 05/14/2022 showed an EF of 60 to 65%, G1 DD, moderately dilated left atria, mild-moderate mitral valve regurgitation, mild tricuspid valve regurgitation.She was noted to  have a previous EF of 40-45%.  She had been initially evaluated due to chest discomfort.  Her echocardiogram was reviewed which showed some inferior hypokinesis and depressed EF.  Cardiac catheterization was recommended.  She underwent cardiac catheterization 12/20 which showed normal coronary arteries.  She was seen by Dr. Harriet Masson on 10/12/2020.  She remained stable from a cardiac standpoint.  Her lisinopril was previously stopped due to nausea.  She was continued on rosuvastatin.  She had been transitioned from Xarelto to Eliquis due to pulmonary embolism and was following with PCP and oncology.  Follow-up was planned for 1 year.  She presented to the emergency department on 04/26/2022.  She complained of vertigo.  No significant blockages were noted on carotid ultrasound.  Echocardiogram 05/14/2022 showed normal LVEF.  Results were reviewed by [name], NP and recommendation was made for follow-up with cardiology.  She presents to the clinic today for follow-up evaluation states she feels well.  She has not had any further episodes of dizziness.  We reviewed her EKG, recent echocardiogram, and hospital visit.  She expressed understanding.  We discussed possible causes for dizziness.  She reports that she does not hydrate very well.  She does not enjoy drinking water.  She drinks mainly Pepsi.  Her blood pressure today is 128/76 with a pulse of 68.  I offered cardiac event monitor.  She wishes to defer at this time.  I will have her increase her p.o. hydration, maintain her diet, increase her physical activity as tolerated, and plan follow-up  in 6 months.  Today she denies chest pain, shortness of breath, lower extremity edema, fatigue, palpitations, melena, hematuria, hemoptysis, diaphoresis, weakness, presyncope, syncope, orthopnea, and PND.   Home Medications    Prior to Admission medications   Medication Sig Start Date End Date Taking? Authorizing Provider  alendronate (FOSAMAX) 70 MG tablet  TAKE 1 TABLET(70 MG) BY MOUTH EVERY 7 DAYS WITH A FULL GLASS OF WATER AND ON AN EMPTY STOMACH 01/01/22   [name], NP  aspirin EC 81 MG tablet Take 162 mg by mouth daily. Swallow whole.    [provider]  atorvastatin (LIPITOR) 20 MG tablet Take 1 tablet (20 mg total) by mouth daily. 04/24/22   [name], NP  Calcium Carbonate-Vit D-Min (CALTRATE 600+D PLUS MINERALS) 600-800 MG-UNIT TABS Take 1 tablet by mouth twice daily 10/20/21   [name], NP  Cholecalciferol (VITAMIN D3) 10 MCG (400 UNIT) CAPS Take by mouth.    [provider]  COVID-19 mRNA bivalent vaccine, Pfizer, (PFIZER COVID-19 VAC BIVALENT) injection Inject into the muscle. 10/25/21   Carlyle Basques, MD  Iron, Ferrous Sulfate, 325 (65 Fe) MG TABS Take 325 mg by mouth every other day. 05/08/22   [name], NP  lisinopril (ZESTRIL) 2.5 MG tablet TAKE 1 TABLET(2.5 MG) BY MOUTH DAILY 04/02/22   [name], NP  metoprolol succinate (TOPROL-XL) 25 MG 24 hr tablet TAKE 1 TABLET(25 MG) BY MOUTH DAILY 01/22/22   [name], NP  nitroGLYCERIN (NITROSTAT) 0.4 MG SL tablet Place 1 tablet (0.4 mg total) under the tongue every 5 (five) minutes as needed. 07/22/19 03/18/20  Berniece Salines, DO    Family History    Family History  Problem Relation Age of Onset   Hypertension Mother    CVA Mother        hemiparesis   Hypertension Father    Dementia Father    Prostate cancer Father    Hypertension Sister    CAD Sister        scheduled for pacemaker   Hypertension Sister    Skin cancer Sister        melanoma died at age 37   Hypertension Sister        ? PFO repair   Parkinson's disease Sister    Asthma Paternal Grandfather    Hypertension Daughter    Hypertension Son        pacemaker   Colon cancer Neg Hx    Esophageal cancer Neg Hx    She indicated that her mother is alive. She indicated that her father is deceased. She indicated that all of her three sisters are alive.  She indicated that only one of her two brothers is alive. She indicated that her maternal grandmother is deceased. She indicated that her maternal grandfather is deceased. She indicated that her paternal grandmother is deceased. She indicated that her paternal grandfather is deceased. She indicated that her daughter is alive. She indicated that her son is alive. She indicated that the status of her neg hx is unknown. She indicated that her other is alive.  Social History    Social History   Socioeconomic History   Marital status: Married    Spouse name: Truman Hayward   Number of children: 2   Years of education: Not on file   Highest education level: Not on file  Occupational History   Occupation: retired   Tobacco Use   Smoking status: Never   Smokeless tobacco: Never  Vaping Use   Vaping Use:  Never used  Substance and Sexual Activity   Alcohol use: Not Currently   Drug use: Never   Sexual activity: Not Currently  Other Topics Concern   Not on file  Social History Narrative   Married   2 children (son and daughter) live locally   4 grandchildren   Retired   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   No pets   Enjoys spending time with grandchildren, baseball games, walking   Social Determinants of Health   Financial Resource Strain: Low Risk  (02/22/2021)   Overall Financial Resource Strain (CARDIA)    Difficulty of Paying Living Expenses: Not hard at all  Food Insecurity: No Food Insecurity (02/22/2021)   Hunger Vital Sign    Worried About Running Out of Food in the Last Year: Never true    [place] in the Last Year: Never true  Transportation Needs: No Transportation Needs (02/22/2021)   PRAPARE - Hydrologist (Medical): No    Lack of Transportation (Non-Medical): No  Physical Activity: Inactive (02/22/2021)   Exercise Vital Sign    Days of Exercise per Week: 0 days    Minutes of Exercise per Session: 0 min  Stress: No Stress Concern  Present (02/22/2021)   [place]    Feeling of Stress : Not at all  Social Connections: Moderately Integrated (02/22/2021)   Social Connection and Isolation Panel [NHANES]    Frequency of Communication with Friends and Family: More than three times a week    Frequency of Social Gatherings with Friends and Family: More than three times a week    Attends Religious Services: More than 4 times per year    Active Member of [employer] or Organizations: No    Attends Archivist Meetings: Never    Marital Status: Married  Human resources officer Violence: Not At Risk (02/22/2021)   Humiliation, Afraid, Rape, and Kick questionnaire    Fear of Current or Ex-Partner: No    Emotionally Abused: No    Physically Abused: No    Sexually Abused: No     Review of Systems    General:  No chills, fever, night sweats or weight changes.  Cardiovascular:  No chest pain, dyspnea on exertion, edema, orthopnea, palpitations, paroxysmal nocturnal dyspnea. Dermatological: No rash, lesions/masses Respiratory: No cough, dyspnea Urologic: No hematuria, dysuria Abdominal:   No nausea, vomiting, diarrhea, bright red blood per rectum, melena, or hematemesis Neurologic:  No visual changes, wkns, changes in mental status. All other systems reviewed and are otherwise negative except as noted above.  Physical Exam    VS:  BP 128/78   Pulse 68   Ht '5\' 1"'$  (1.549 m)   Wt 192 lb 3.2 oz (87.2 kg)   BMI 36.32 kg/m  , BMI Body mass index is 36.32 kg/m. GEN: Well nourished, well developed, in no acute distress. HEENT: normal. Neck: Supple, no JVD, carotid bruits, or masses. Cardiac: RRR, no murmurs, rubs, or gallops. No clubbing, cyanosis, edema.  Radials/DP/PT 2+ and equal bilaterally.  Respiratory:  Respirations regular and unlabored, clear to auscultation bilaterally. GI: Soft, nontender, nondistended, BS + x 4. MS: no deformity or atrophy. Skin: warm  and dry, no rash. Neuro:  Strength and sensation are intact. Psych: Normal affect.  Accessory Clinical Findings    Recent Labs: 06/26/2021: Hemoglobin 10.5; Platelet Count 289 10/20/2021: ALT 10 05/07/2022: BUN 14; Creatinine, Ser 0.83; Potassium 4.2; Sodium 141  Recent Lipid Panel    Component Value Date/Time   CHOL 176 05/07/2022 1110   CHOL 234 (H) 07/22/2019 1418   TRIG 87.0 05/07/2022 1110   HDL 53.90 05/07/2022 1110   HDL 51 07/22/2019 1418   CHOLHDL 3 05/07/2022 1110   VLDL 17.4 05/07/2022 1110   LDLCALC 105 (H) 05/07/2022 1110   LDLCALC 145 (H) 04/14/2021 1447         ECG personally reviewed by me today-sinus rhythm with occasional premature ventricular complexes minimal voltage criteria for LVH 68 bpm- No acute changes  Echocardiogram 05/14/2022  IMPRESSIONS     1. Left ventricular ejection fraction, by estimation, is 60 to 65%. The  left ventricle has normal function. The left ventricle has no regional  wall motion abnormalities. Left ventricular diastolic parameters are  consistent with Grade I diastolic  dysfunction (impaired relaxation).   2. Right ventricular systolic function is normal. The right ventricular  size is normal. There is normal pulmonary artery systolic pressure.   3. Left atrial size was moderately dilated.   4. The mitral valve is normal in structure. Mild to moderate mitral valve  regurgitation. No evidence of mitral stenosis.   5. The aortic valve is normal in structure. Aortic valve regurgitation is  not visualized. No aortic stenosis is present.   6. The inferior vena cava is normal in size with greater than 50%  respiratory variability, suggesting right atrial pressure of 3 mmHg.  Assessment & Plan   1.  Irregular heartbeat-reports over the last several weeks she has had intermittent episodes of irregular heartbeat.  Presented to the emergency department and was diagnosed with vertigo.  Echocardiogram showed normal LVEF, G1 DD,  mild-moderate mitral valve regurgitation.  Details above. Continue metoprolol Heart healthy low-sodium diet-salty 6 given Increase physical activity as tolerated Wishes to defer cardiac event monitor at this time.  Essential hypertension-BP today 128/78.  Well-controlled at home. Continue metoprolol Heart healthy low-sodium diet-salty 6 given Increase physical activity as tolerated  Hyperlipidemia-LDL 105 on 05/07/22 Continue rosuvastatin, aspirin Heart healthy low-sodium high-fiber diet.   Increase physical activity as tolerated  Nonischemic cardiomyopathy-no increased DOE or activity intolerance.  Echocardiogram previously showed EF of 40-45%.  Subsequent echocardiogram showed improved EF.  Was not able to tolerate lisinopril and medication was discontinued. Continue to monitor  Disposition: Follow-up with Dr.Tobb or me in 6 months.   Jossie Ng. Revis Whalin NP-C     05/15/2022, 3:16 PM [facility] [place] [address] Office [phone] Fax [phone]  Notice: This dictation was prepared with Dragon dictation along with smaller phrase technology. Any transcriptional errors that result from this process are unintentional and may not be corrected upon review.  I spent 14 minutes examining this patient, reviewing medications, and using patient centered shared decision making involving her cardiac care.  Prior to her visit I spent greater than 20 minutes reviewing her past medical history,  medications, and prior cardiac tests.

## 2022-05-14 NOTE — Progress Notes (Signed)
  Echocardiogram 2D Echocardiogram has been performed.  Merrie Roof F 05/14/2022, 8:24 AM

## 2022-05-15 ENCOUNTER — Ambulatory Visit: Payer: Medicare PPO | Attending: General Practice | Admitting: General Practice

## 2022-05-15 ENCOUNTER — Encounter: Payer: Self-pay | Admitting: General Practice

## 2022-05-15 VITALS — BP 128/78 | HR 68 | Ht 61.0 in | Wt 192.2 lb

## 2022-05-15 DIAGNOSIS — I499 Cardiac arrhythmia, unspecified: Secondary | ICD-10-CM

## 2022-05-15 DIAGNOSIS — I10 Essential (primary) hypertension: Secondary | ICD-10-CM | POA: Diagnosis not present

## 2022-05-15 DIAGNOSIS — E782 Mixed hyperlipidemia: Secondary | ICD-10-CM

## 2022-05-15 DIAGNOSIS — I428 Other cardiomyopathies: Secondary | ICD-10-CM

## 2022-05-15 NOTE — Patient Instructions (Signed)
Medication Instructions:  The current medical regimen is effective;  continue present plan and medications as directed. Please refer to the Current Medication list given to you today.   *If you need a refill on your cardiac medications before your next appointment, please call your pharmacy*  Lab Work: NONE If you have labs (blood work) drawn today and your tests are completely normal, you will receive your results only by:  [place] (if you have MyChart) OR A paper copy in the mail If you have any lab test that is abnormal or we need to change your treatment, we will call you to review the results.  Testing/Procedures: PLEASE CALL IF YOU HAVE DIZZY SPELLS SO WE CAN ORDER A MONITOR  Follow-Up: At [facility], you and your health needs are our priority.  As part of our continuing mission to provide you with exceptional heart care, we have created designated Provider Care Teams.  These Care Teams include your primary Cardiologist (physician) and Advanced Practice Providers (APPs -  Physician Assistants and Nurse Practitioners) who all work together to provide you with the care you need, when you need it.  We recommend signing up for the patient portal called "MyChart".  Sign up information is provided on this After Visit Summary.  MyChart is used to connect with patients for Virtual Visits (Telemedicine).  Patients are able to view lab/test results, encounter notes, upcoming appointments, etc.  Non-urgent messages can be sent to your provider as well.   To learn more about what you can do with MyChart, go to [email or web address].    Your next appointment:  Your next appointment:  Provider:   6 month(s)                      In Person               Kardie Tobb, DO     Other Instructions INCREASE HYDRATION  Important Information About Sugar

## 2022-06-06 ENCOUNTER — Ambulatory Visit (INDEPENDENT_AMBULATORY_CARE_PROVIDER_SITE_OTHER): Payer: Medicare PPO | Admitting: *Deleted

## 2022-06-06 ENCOUNTER — Other Ambulatory Visit (HOSPITAL_BASED_OUTPATIENT_CLINIC_OR_DEPARTMENT_OTHER): Payer: Self-pay

## 2022-06-06 DIAGNOSIS — E538 Deficiency of other specified B group vitamins: Secondary | ICD-10-CM | POA: Diagnosis not present

## 2022-06-06 MED ORDER — COVID-19 MRNA 2023-2024 VACCINE (COMIRNATY) 0.3 ML INJECTION
INTRAMUSCULAR | 0 refills | Status: DC
  Filled 2022-06-06: qty 0.3, 1d supply, fill #0

## 2022-06-06 MED ORDER — CYANOCOBALAMIN 1000 MCG/ML IJ SOLN
1000.0000 ug | Freq: Once | INTRAMUSCULAR | Status: AC
  Administered 2022-06-06: 1000 ug via INTRAMUSCULAR

## 2022-06-06 NOTE — Progress Notes (Signed)
Pt here for monthly B12 injection per [name] NP  B12 1067mg given in left arm, and pt tolerated injection well.  Next B12 injection scheduled for 07/10/2022

## 2022-06-26 ENCOUNTER — Encounter: Payer: Self-pay | Admitting: Family

## 2022-06-26 ENCOUNTER — Inpatient Hospital Stay: Payer: Medicare PPO | Attending: Family

## 2022-06-26 ENCOUNTER — Inpatient Hospital Stay (HOSPITAL_BASED_OUTPATIENT_CLINIC_OR_DEPARTMENT_OTHER): Payer: Medicare PPO | Admitting: Family

## 2022-06-26 VITALS — BP 152/62 | HR 80 | Temp 98.6°F | Resp 18 | Wt 196.8 lb

## 2022-06-26 DIAGNOSIS — D509 Iron deficiency anemia, unspecified: Secondary | ICD-10-CM

## 2022-06-26 DIAGNOSIS — Z7982 Long term (current) use of aspirin: Secondary | ICD-10-CM | POA: Diagnosis not present

## 2022-06-26 DIAGNOSIS — I82431 Acute embolism and thrombosis of right popliteal vein: Secondary | ICD-10-CM

## 2022-06-26 DIAGNOSIS — I82401 Acute embolism and thrombosis of unspecified deep veins of right lower extremity: Secondary | ICD-10-CM | POA: Diagnosis not present

## 2022-06-26 DIAGNOSIS — R42 Dizziness and giddiness: Secondary | ICD-10-CM | POA: Diagnosis not present

## 2022-06-26 DIAGNOSIS — E611 Iron deficiency: Secondary | ICD-10-CM | POA: Diagnosis not present

## 2022-06-26 DIAGNOSIS — I2699 Other pulmonary embolism without acute cor pulmonale: Secondary | ICD-10-CM

## 2022-06-26 DIAGNOSIS — R112 Nausea with vomiting, unspecified: Secondary | ICD-10-CM | POA: Insufficient documentation

## 2022-06-26 LAB — CBC WITH DIFFERENTIAL (CANCER CENTER ONLY)
Abs Immature Granulocytes: 0.02 10*3/uL (ref 0.00–0.07)
Basophils Absolute: 0 10*3/uL (ref 0.0–0.1)
Basophils Relative: 1 %
Eosinophils Absolute: 0.2 10*3/uL (ref 0.0–0.5)
Eosinophils Relative: 3 %
HCT: 37.4 % (ref 36.0–46.0)
Hemoglobin: 11.3 g/dL — ABNORMAL LOW (ref 12.0–15.0)
Immature Granulocytes: 0 %
Lymphocytes Relative: 31 %
Lymphs Abs: 2.6 10*3/uL (ref 0.7–4.0)
MCH: 25.5 pg — ABNORMAL LOW (ref 26.0–34.0)
MCHC: 30.2 g/dL (ref 30.0–36.0)
MCV: 84.2 fL (ref 80.0–100.0)
Monocytes Absolute: 0.6 10*3/uL (ref 0.1–1.0)
Monocytes Relative: 8 %
Neutro Abs: 4.8 10*3/uL (ref 1.7–7.7)
Neutrophils Relative %: 57 %
Platelet Count: 268 10*3/uL (ref 150–400)
RBC: 4.44 MIL/uL (ref 3.87–5.11)
RDW: 14.6 % (ref 11.5–15.5)
WBC Count: 8.3 10*3/uL (ref 4.0–10.5)
nRBC: 0 % (ref 0.0–0.2)

## 2022-06-26 LAB — D-DIMER, QUANTITATIVE: D-Dimer, Quant: 0.58 ug{FEU}/mL — ABNORMAL HIGH (ref 0.00–0.50)

## 2022-06-26 LAB — CMP (CANCER CENTER ONLY)
ALT: 12 U/L (ref 0–44)
AST: 15 U/L (ref 15–41)
Albumin: 4.1 g/dL (ref 3.5–5.0)
Alkaline Phosphatase: 97 U/L (ref 38–126)
Anion gap: 7 (ref 5–15)
BUN: 21 mg/dL (ref 8–23)
CO2: 29 mmol/L (ref 22–32)
Calcium: 9.8 mg/dL (ref 8.9–10.3)
Chloride: 107 mmol/L (ref 98–111)
Creatinine: 0.84 mg/dL (ref 0.44–1.00)
GFR, Estimated: >60 mL/min
Glucose, Bld: 122 mg/dL — ABNORMAL HIGH (ref 70–99)
Potassium: 3.5 mmol/L (ref 3.5–5.1)
Sodium: 143 mmol/L (ref 135–145)
Total Bilirubin: 0.7 mg/dL (ref 0.3–1.2)
Total Protein: 6.9 g/dL (ref 6.5–8.1)

## 2022-06-26 LAB — FERRITIN: Ferritin: 7 ng/mL — ABNORMAL LOW (ref 11–307)

## 2022-06-26 LAB — IRON AND IRON BINDING CAPACITY (CC-WL,HP ONLY)
Iron: 88 ug/dL (ref 28–170)
Saturation Ratios: 23 % (ref 10.4–31.8)
TIBC: 386 ug/dL (ref 250–450)
UIBC: 298 ug/dL (ref 148–442)

## 2022-06-26 NOTE — Progress Notes (Signed)
Hematology and Oncology Follow Up Visit  Ruth Jensen [id number] 09-04-1950 71 y.o. 06/26/2022   Principle Diagnosis:  Left lung PTE and RLE DVT, unprovoked   Current Therapy:        06/2019 - present: Xarelto '20mg'$  daily; lifelong anticoagulation - patient discontinued due to cost 2 baby aspirin daily   Interim History:  Ruth Jensen is here today for follow-up. She is doing well but states that back in August she experienced some dizziness and n/v. She did go to the ED and it was felt that she may have had a TIA. Her symptoms resolved and she has not had any more issues. 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  She is taking her 2 baby aspirin daily.  No bleeding, bruising or petechiae.  No fever, chills, n/v, cough, rash, dizziness, SOB, chest pain, palpitations, abdominal pain or changes in bowel or bladder habits.  No swelling, tenderness, numbness or tingling in her extremities.  No falls or syncope reported.  Appetite and hydration are good. Weight is stable at 196 lbs.   ECOG Performance Status: 1 - Symptomatic but completely ambulatory  Medications:  Allergies as of 06/26/2022   No Known Allergies      Medication List        Accurate as of June 26, 2022 12:26 PM. If you have any questions, ask your nurse or doctor.          alendronate 70 MG tablet Commonly known as: FOSAMAX TAKE 1 TABLET(70 MG) BY MOUTH EVERY 7 DAYS WITH A FULL GLASS OF WATER AND ON AN EMPTY STOMACH   aspirin EC 81 MG tablet Take 162 mg by mouth daily. Swallow whole.   atorvastatin 20 MG tablet Commonly known as: LIPITOR Take 1 tablet (20 mg total) by mouth daily.   Caltrate 600+D Plus Minerals 600-800 MG-UNIT Tabs Take 1 tablet by mouth twice daily   COVID-19 mRNA vaccine 2023-2024 Susp injection Commonly known as: COMIRNATY Inject into the muscle.   Iron (Ferrous Sulfate) 325 (65 Fe) MG Tabs Take 325 mg by  mouth every other day.   lisinopril 2.5 MG tablet Commonly known as: ZESTRIL TAKE 1 TABLET(2.5 MG) BY MOUTH DAILY   metoprolol succinate 25 MG 24 hr tablet Commonly known as: TOPROL-XL TAKE 1 TABLET(25 MG) BY MOUTH DAILY   nitroGLYCERIN 0.4 MG SL tablet Commonly known as: NITROSTAT Place 1 tablet (0.4 mg total) under the tongue every 5 (five) minutes as needed.   Pfizer COVID-19 Vac Bivalent injection Generic drug: COVID-19 mRNA bivalent vaccine Therapist, music) Inject into the muscle.   Vitamin D3 10 MCG (400 UNIT) Caps Take by mouth.        Allergies: No Known Allergies  Past Medical History, Surgical history, Social history, and Family History were reviewed and updated.  Review of Systems: All other 10 point review of systems is negative.   Physical Exam:  weight is 196 lb 12.8 oz (89.3 kg). Her oral temperature is 98.6 F (37 C). Her blood pressure is 152/62 (abnormal) and her pulse is 80. Her respiration is 18 and oxygen saturation is 100%.   Wt Readings from Last 3 Encounters:  06/26/22 196 lb 12.8 oz (89.3 kg)  05/15/22 192 lb 3.2 oz (87.2 kg)  05/07/22 193 lb (87.5 kg)    Ocular: Sclerae unicteric, pupils equal, round and reactive to light Ear-nose-throat: Oropharynx clear, dentition fair Lymphatic: No cervical or supraclavicular adenopathy Lungs no rales or rhonchi,  good excursion bilaterally Heart regular rate and rhythm, no murmur appreciated Abd soft, nontender, positive bowel sounds MSK no focal spinal tenderness, no joint edema Neuro: non-focal, well-oriented, appropriate affect Breasts: Deferred   Lab Results  Component Value Date   WBC 8.3 06/26/2022   HGB 11.3 (L) 06/26/2022   HCT 37.4 06/26/2022   MCV 84.2 06/26/2022   PLT 268 06/26/2022   Lab Results  Component Value Date   FERRITIN 8 (L) 05/07/2022   IRON 66 05/07/2022   TIBC 374 05/07/2022   UIBC 255 06/26/2021   IRONPCTSAT 18 05/07/2022   Lab Results  Component Value Date    RETICCTPCT 1.9 07/03/2019   RBC 4.44 06/26/2022   No results found for: "KPAFRELGTCHN", "LAMBDASER", "KAPLAMBRATIO" Lab Results  Component Value Date   IGA 320 04/06/2020   No results found for: "TOTALPROTELP", "ALBUMINELP", "A1GS", "A2GS", "BETS", "BETA2SER", "GAMS", "MSPIKE", "SPEI"   Chemistry      Component Value Date/Time   NA 143 06/26/2022 1011   NA 145 (H) 11/25/2019 1012   K 3.5 06/26/2022 1011   CL 107 06/26/2022 1011   CO2 29 06/26/2022 1011   BUN 21 06/26/2022 1011   BUN 11 11/25/2019 1012   CREATININE 0.84 06/26/2022 1011   CREATININE 0.79 04/14/2021 1447      Component Value Date/Time   CALCIUM 9.8 06/26/2022 1011   ALKPHOS 97 06/26/2022 1011   AST 15 06/26/2022 1011   ALT 12 06/26/2022 1011   BILITOT 0.7 06/26/2022 1011       Impression and Plan: Ruth Jensen is a very pleasant 71 yo African American female with left lung PTE and RLE DVT, unprovoked.   She stopped anticoagulation due to cost.  She will continue her 2 baby aspirin daily.  Follow-up in 6 months.   Lottie Dawson, NP 10/31/202312:26 PM

## 2022-06-27 ENCOUNTER — Telehealth: Payer: Self-pay

## 2022-06-27 NOTE — Telephone Encounter (Signed)
Spoke with pt who states she is not taking any iron currently though it states on her med list that she is taking OTC iron EOD. Per Judson Roch ok to take Fusion Plus OTC daily since it is gentle on the stomach. Pt advised and states she will call if she encounters any issues.

## 2022-06-27 NOTE — Telephone Encounter (Signed)
-----   Message from Celso Amy, NP sent at 06/26/2022  4:26 PM EDT ----- Ferritin is low. Can she try taking a daily supplement? If this causes her to have GI upset we can give IV. Thank you!  ----- Message ----- From: Interface, Lab In [place] Sent: 06/26/2022  10:21 AM EDT To: Celso Amy, NP

## 2022-06-29 ENCOUNTER — Telehealth: Payer: Self-pay | Admitting: Family

## 2022-06-29 MED ORDER — LISINOPRIL 2.5 MG PO TABS: ORAL_TABLET | ORAL | 0 refills | Status: DC

## 2022-06-29 NOTE — Telephone Encounter (Signed)
Medication: lisinopril (ZESTRIL) 2.5 MG tablet   Has the patient contacted their pharmacy? Yes.    Preferred Pharmacy (with phone number or street name):  [id number] - [place], [place] - [address] AT [address], [place] [address] Phone: [phone]  Fax: [phone]

## 2022-06-29 NOTE — Telephone Encounter (Signed)
Rx sent 

## 2022-07-09 NOTE — Progress Notes (Addendum)
Ruth Jensen is a 71 y.o. female presents to the office today for Monthly B12: injection, per physician's orders. Original order: "05/07/22: Continue b12 injections. Follow up b12 level is normal. " Cyanocobalamin, 1000 mg/ml IM was administered in L deltoid today. Patient tolerated injection. Patient due for follow up labs/provider appt: No.  Patient next injection due: 1 month, appt made Yes  [place], [name] L

## 2022-07-10 ENCOUNTER — Ambulatory Visit (INDEPENDENT_AMBULATORY_CARE_PROVIDER_SITE_OTHER): Payer: Medicare PPO

## 2022-07-10 DIAGNOSIS — E538 Deficiency of other specified B group vitamins: Secondary | ICD-10-CM | POA: Diagnosis not present

## 2022-07-10 MED ORDER — CYANOCOBALAMIN 1000 MCG/ML IJ SOLN
1000.0000 ug | Freq: Once | INTRAMUSCULAR | Status: AC
  Administered 2022-07-10: 1000 ug via INTRAMUSCULAR

## 2022-07-31 ENCOUNTER — Other Ambulatory Visit: Payer: Self-pay

## 2022-07-31 MED ORDER — METOPROLOL SUCCINATE ER 25 MG PO TB24: ORAL_TABLET | ORAL | 1 refills | Status: DC

## 2022-08-06 ENCOUNTER — Ambulatory Visit: Payer: Medicare PPO | Admitting: Family

## 2022-08-13 ENCOUNTER — Encounter: Payer: Self-pay | Admitting: Family

## 2022-08-13 ENCOUNTER — Ambulatory Visit (INDEPENDENT_AMBULATORY_CARE_PROVIDER_SITE_OTHER): Payer: Medicare PPO | Admitting: Family

## 2022-08-13 VITALS — BP 142/61 | HR 70 | Temp 98.1°F | Resp 16 | Ht 61.0 in | Wt 197.0 lb

## 2022-08-13 DIAGNOSIS — M810 Age-related osteoporosis without current pathological fracture: Secondary | ICD-10-CM

## 2022-08-13 DIAGNOSIS — I10 Essential (primary) hypertension: Secondary | ICD-10-CM

## 2022-08-13 DIAGNOSIS — E782 Mixed hyperlipidemia: Secondary | ICD-10-CM

## 2022-08-13 DIAGNOSIS — E538 Deficiency of other specified B group vitamins: Secondary | ICD-10-CM

## 2022-08-13 DIAGNOSIS — D500 Iron deficiency anemia secondary to blood loss (chronic): Secondary | ICD-10-CM | POA: Diagnosis not present

## 2022-08-13 DIAGNOSIS — Z8673 Personal history of transient ischemic attack (TIA), and cerebral infarction without residual deficits: Secondary | ICD-10-CM | POA: Diagnosis not present

## 2022-08-13 MED ORDER — ASPIRIN 325 MG PO TBEC: 325.0000 mg | DELAYED_RELEASE_TABLET | Freq: Every day | ORAL | Status: DC

## 2022-08-13 MED ORDER — CYANOCOBALAMIN 1000 MCG/ML IJ SOLN
1000.0000 ug | Freq: Once | INTRAMUSCULAR | Status: AC
  Administered 2022-08-13: 1000 ug via INTRAMUSCULAR

## 2022-08-13 MED ORDER — ATORVASTATIN CALCIUM 40 MG PO TABS: 40.0000 mg | ORAL_TABLET | Freq: Every day | ORAL | 1 refills | Status: DC

## 2022-08-13 MED ORDER — METOPROLOL SUCCINATE ER 50 MG PO TB24: 50.0000 mg | ORAL_TABLET | Freq: Every day | ORAL | 1 refills | Status: DC

## 2022-08-13 NOTE — Assessment & Plan Note (Signed)
B12 shot today. Continue monthly injections.

## 2022-08-13 NOTE — Assessment & Plan Note (Signed)
Lab Results  Component Value Date   CHOL 176 05/07/2022   HDL 53.90 05/07/2022   LDLCALC 105 (H) 05/07/2022   TRIG 87.0 05/07/2022   CHOLHDL 3 05/07/2022   Goal LDL with stroke hx is <70. Recommend increase lipitor from '20mg'$  to '40mg'$ .

## 2022-08-13 NOTE — Progress Notes (Signed)
Subjective:   By signing my name below, I, Luna Glasgow, attest that this documentation has been prepared under the direction and in the presence of [name], 08/13/2022.     Patient ID: Ruth Jensen, female    DOB: [birth year], 71 y.o.   MRN: [id number]  Chief Complaint  Patient presents with   Hyperlipidemia    Here for follow up   Anemia    Taking iron   Hip Pain    Complains of left hip pain   Hypertension    Here for follow up    HPI Patient is in today for an office visit.  Hypertension Patients blood pressure is high this visit. She is complaint with 2.5 mg zestril and 25 mg toprol-xl.   Hyperlipidemia Patient is complaint with her 20 mg Lipitor.  Anemia Patient is complaint with her daily 325 mg iron supplements.  Hip pain  Patient is complaining about hip pain on her left side at night. She states that it runs down her leg. She confirms occasional back pain. Typically she rolls to the other side and pain resolves.   Osteoporosis Patient is complaint with her 70 mg fosamax.  Immunizations Patient reports that she will not be receiving a shingles vaccine because it is too expensive.  Health Maintenance Due  Topic Date Due   DTaP/Tdap/Td (1 - Tdap) Never done   Zoster Vaccines- Shingrix (1 of 2) Never done    Past Medical History:  Diagnosis Date   Abnormal echocardiogram 07/22/2019   Abnormal EKG 07/22/2019   Acute deep vein thrombosis (DVT) of popliteal vein of right lower extremity ([place]) 07/01/2019   Acute pulmonary embolism ([place]) 07/02/2019   Chest pain 07/22/2019   Depressed left ventricular ejection fraction 62/37/6283   Diastolic dysfunction    DVT (deep venous thrombosis) ([place]) 07/01/2019   History of DVT (deep vein thrombosis) 07/01/2019   History of pulmonary embolism 07/02/2019   Hypertension    Lipid screening 07/22/2019   Obesity (BMI 30-39.9) 07/22/2019   Osteoporosis 11/18/2019   Pulmonary embolism ([place]) 07/02/2019   Single  subsegmental pulmonary embolism without acute cor pulmonale ([place]) 07/01/2019    Past Surgical History:  Procedure Laterality Date   ABDOMINAL HYSTERECTOMY  2010   due to fibroids   BREAST BIOPSY Left    BREAST CYST EXCISION Left    COLONOSCOPY     [facility]. Late 2000's    LEFT HEART CATH AND CORONARY ANGIOGRAPHY N/A 08/03/2019   Procedure: LEFT HEART CATH AND CORONARY ANGIOGRAPHY;  Surgeon: Lorretta Harp, MD;  Location: [facility];  Service: Cardiovascular;  Laterality: N/A;   mitral valve regurg  2023   moderat per 2D echo   OOPHORECTOMY      Family History  Problem Relation Age of Onset   Hypertension Mother    CVA Mother        hemiparesis   Hypertension Father    Dementia Father    Prostate cancer Father    Hypertension Sister    CAD Sister        scheduled for pacemaker   Hypertension Sister    Skin cancer Sister        melanoma died at age 51   Hypertension Sister        ? PFO repair   Parkinson's disease Sister    Asthma Paternal Grandfather    Hypertension Daughter    Hypertension Son        pacemaker  Colon cancer Neg Hx    Esophageal cancer Neg Hx     Social History   Socioeconomic History   Marital status: Married    Spouse name: Truman Hayward   Number of children: 2   Years of education: Not on file   Highest education level: Not on file  Occupational History   Occupation: retired   Tobacco Use   Smoking status: Never   Smokeless tobacco: Never  Vaping Use   Vaping Use: Never used  Substance and Sexual Activity   Alcohol use: Not Currently   Drug use: Never   Sexual activity: Not Currently  Other Topics Concern   Not on file  Social History Narrative   Married   2 children (son and daughter) live locally   4 grandchildren   Retired   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   No pets   Enjoys spending time with grandchildren, baseball games, walking   Social Determinants of Health   Financial Resource Strain: Low Risk   (02/22/2021)   Overall Financial Resource Strain (CARDIA)    Difficulty of Paying Living Expenses: Not hard at all  Food Insecurity: No Food Insecurity (02/22/2021)   Hunger Vital Sign    Worried About Running Out of Food in the Last Year: Never true    [place] in the Last Year: Never true  Transportation Needs: No Transportation Needs (02/22/2021)   PRAPARE - Hydrologist (Medical): No    Lack of Transportation (Non-Medical): No  Physical Activity: Inactive (02/22/2021)   Exercise Vital Sign    Days of Exercise per Week: 0 days    Minutes of Exercise per Session: 0 min  Stress: No Stress Concern Present (02/22/2021)   [place]    Feeling of Stress : Not at all  Social Connections: Moderately Integrated (02/22/2021)   Social Connection and Isolation Panel [NHANES]    Frequency of Communication with Friends and Family: More than three times a week    Frequency of Social Gatherings with Friends and Family: More than three times a week    Attends Religious Services: More than 4 times per year    Active Member of [employer] or Organizations: No    Attends Archivist Meetings: Never    Marital Status: Married  Human resources officer Violence: Not At Risk (02/22/2021)   Humiliation, Afraid, Rape, and Kick questionnaire    Fear of Current or Ex-Partner: No    Emotionally Abused: No    Physically Abused: No    Sexually Abused: No    Outpatient Medications Prior to Visit  Medication Sig Dispense Refill   alendronate (FOSAMAX) 70 MG tablet TAKE 1 TABLET(70 MG) BY MOUTH EVERY 7 DAYS WITH A FULL GLASS OF WATER AND ON AN EMPTY STOMACH 4 tablet 11   Calcium Carbonate-Vit D-Min (CALTRATE 600+D PLUS MINERALS) 600-800 MG-UNIT TABS Take 1 tablet by mouth twice daily     Cholecalciferol (VITAMIN D3) 10 MCG (400 UNIT) CAPS Take by mouth.     COVID-19 mRNA bivalent vaccine, Pfizer, (PFIZER COVID-19 VAC  BIVALENT) injection Inject into the muscle. 0.3 mL 0   COVID-19 mRNA vaccine 2023-2024 (COMIRNATY) SUSP injection Inject into the muscle. 0.3 mL 0   Iron, Ferrous Sulfate, 325 (65 Fe) MG TABS Take 325 mg by mouth every other day. (Patient not taking: Reported on 06/27/2022) 30 tablet    lisinopril (ZESTRIL) 2.5 MG tablet TAKE 1  TABLET(2.5 MG) BY MOUTH DAILY 90 tablet 0   nitroGLYCERIN (NITROSTAT) 0.4 MG SL tablet Place 1 tablet (0.4 mg total) under the tongue every 5 (five) minutes as needed. 30 tablet 3   aspirin EC 81 MG tablet Take 162 mg by mouth daily. Swallow whole.     atorvastatin (LIPITOR) 20 MG tablet Take 1 tablet (20 mg total) by mouth daily. 90 tablet 1   metoprolol succinate (TOPROL-XL) 25 MG 24 hr tablet TAKE 1 TABLET(25 MG) BY MOUTH DAILY 90 tablet 1   No facility-administered medications prior to visit.    No Known Allergies  Review of Systems  Musculoskeletal:        (+) hip pain       Objective:    Physical Exam Constitutional:      General: She is not in acute distress.    Appearance: Normal appearance. She is not ill-appearing.  HENT:     Head: Normocephalic and atraumatic.     Right Ear: External ear normal.     Left Ear: External ear normal.  Eyes:     Extraocular Movements: Extraocular movements intact.     Pupils: Pupils are equal, round, and reactive to light.  Cardiovascular:     Rate and Rhythm: Normal rate and regular rhythm.     Heart sounds: Normal heart sounds. No murmur heard.    No gallop.  Pulmonary:     Effort: Pulmonary effort is normal. No respiratory distress.     Breath sounds: Normal breath sounds. No wheezing or rales.  Skin:    General: Skin is warm and dry.  Neurological:     Mental Status: She is alert and oriented to person, place, and time.  Psychiatric:        Mood and Affect: Mood normal.        Behavior: Behavior normal.        Judgment: Judgment normal.     BP (!) 142/61   Pulse 70   Temp 98.1 F (36.7 C) (Oral)    Resp 16   Ht '5\' 1"'$  (1.549 m)   Wt 197 lb (89.4 kg)   SpO2 100%   BMI 37.22 kg/m  Wt Readings from Last 3 Encounters:  08/13/22 197 lb (89.4 kg)  06/26/22 196 lb 12.8 oz (89.3 kg)  05/15/22 192 lb 3.2 oz (87.2 kg)       Assessment & Plan:   Problem List Items Addressed This Visit       Unprioritized   Osteoporosis    On fosamax. Due for follow up bone density. Will order.       Relevant Orders   DG Bone Density   Mixed hyperlipidemia    Lab Results  Component Value Date   CHOL 176 05/07/2022   HDL 53.90 05/07/2022   LDLCALC 105 (H) 05/07/2022   TRIG 87.0 05/07/2022   CHOLHDL 3 05/07/2022  Goal LDL with stroke hx is <70. Recommend increase lipitor from '20mg'$  to '40mg'$ .       Relevant Medications   metoprolol succinate (TOPROL-XL) 50 MG 24 hr tablet   aspirin EC 325 MG tablet   atorvastatin (LIPITOR) 40 MG tablet   Iron deficiency anemia due to chronic blood loss    Lab Results  Component Value Date   WBC 8.3 06/26/2022   HGB 11.3 (L) 06/26/2022   HCT 37.4 06/26/2022   MCV 84.2 06/26/2022   PLT 268 06/26/2022  Continue iron '325mg'$ . Iron studies were WNL last month.  Hypertension    BP is above goal. Will increase toprol xl from '25mg'$  to '50mg'$ . Continue lisinopril.       Relevant Medications   metoprolol succinate (TOPROL-XL) 50 MG 24 hr tablet   aspirin EC 325 MG tablet   atorvastatin (LIPITOR) 40 MG tablet   History of CVA (cerebrovascular accident)    Will change from 162 mg asa to 325 as stroke change occurred on the 162.        B12 deficiency - Primary    B12 shot today. Continue monthly injections.       Meds ordered this encounter  Medications   cyanocobalamin (VITAMIN B12) injection 1,000 mcg   metoprolol succinate (TOPROL-XL) 50 MG 24 hr tablet    Sig: Take 1 tablet (50 mg total) by mouth daily. Take with or immediately following a meal.    Dispense:  90 tablet    Refill:  1   aspirin EC 325 MG tablet    Sig: Take 1 tablet (325 mg  total) by mouth daily.   atorvastatin (LIPITOR) 40 MG tablet    Sig: Take 1 tablet (40 mg total) by mouth daily.    Dispense:  90 tablet    Refill:  1    I, [name], personally preformed the services described in this documentation.  All medical record entries made by the scribe were at my direction and in my presence.  I have reviewed the chart and discharge instructions (if applicable) and agree that the record reflects my personal performance and is accurate and complete. 08/13/2022.   I,Verona Buck,acting as a Education administrator for [employer], NP.,have documented all relevant documentation on the behalf of Nance Pear, NP,as directed by  Nance Pear, NP while in the presence of Nance Pear, NP.    Nance Pear, NP

## 2022-08-13 NOTE — Assessment & Plan Note (Signed)
Lab Results  Component Value Date   WBC 8.3 06/26/2022   HGB 11.3 (L) 06/26/2022   HCT 37.4 06/26/2022   MCV 84.2 06/26/2022   PLT 268 06/26/2022   Continue iron '325mg'$ . Iron studies were WNL last month.

## 2022-08-13 NOTE — Assessment & Plan Note (Signed)
>>  ASSESSMENT AND PLAN FOR HYPERTENSION WRITTEN ON 08/13/2022 10:51 AM BY O'SULLIVAN, Kameela Leipold, NP  BP is above goal. Will increase toprol  xl from 25mg  to 50mg . Continue lisinopril .

## 2022-08-13 NOTE — Assessment & Plan Note (Signed)
BP is above goal. Will increase toprol xl from '25mg'$  to '50mg'$ . Continue lisinopril.

## 2022-08-13 NOTE — Patient Instructions (Signed)
Increase aspirin to '325mg'$  once daily. Increase metoprolol from '25mg'$  to '50mg'$  once daily for blood pressure.

## 2022-08-13 NOTE — Assessment & Plan Note (Signed)
On fosamax. Due for follow up bone density. Will order.

## 2022-08-13 NOTE — Assessment & Plan Note (Signed)
Will change from 162 mg asa to 325 as stroke change occurred on the 162.

## 2022-09-17 ENCOUNTER — Ambulatory Visit (INDEPENDENT_AMBULATORY_CARE_PROVIDER_SITE_OTHER): Payer: Medicare PPO | Admitting: Family

## 2022-09-17 VITALS — BP 131/63 | HR 94 | Temp 98.0°F | Resp 16 | Wt 195.0 lb

## 2022-09-17 DIAGNOSIS — H8110 Benign paroxysmal vertigo, unspecified ear: Secondary | ICD-10-CM

## 2022-09-17 DIAGNOSIS — D500 Iron deficiency anemia secondary to blood loss (chronic): Secondary | ICD-10-CM | POA: Diagnosis not present

## 2022-09-17 DIAGNOSIS — E538 Deficiency of other specified B group vitamins: Secondary | ICD-10-CM

## 2022-09-17 DIAGNOSIS — K219 Gastro-esophageal reflux disease without esophagitis: Secondary | ICD-10-CM

## 2022-09-17 DIAGNOSIS — I10 Essential (primary) hypertension: Secondary | ICD-10-CM

## 2022-09-17 DIAGNOSIS — R059 Cough, unspecified: Secondary | ICD-10-CM

## 2022-09-17 DIAGNOSIS — M7501 Adhesive capsulitis of right shoulder: Secondary | ICD-10-CM | POA: Diagnosis not present

## 2022-09-17 DIAGNOSIS — M810 Age-related osteoporosis without current pathological fracture: Secondary | ICD-10-CM | POA: Diagnosis not present

## 2022-09-17 DIAGNOSIS — E782 Mixed hyperlipidemia: Secondary | ICD-10-CM

## 2022-09-17 MED ORDER — CYANOCOBALAMIN 1000 MCG/ML IJ SOLN
1000.0000 ug | Freq: Once | INTRAMUSCULAR | Status: AC
  Administered 2022-09-17: 1000 ug via INTRAMUSCULAR

## 2022-09-17 NOTE — Assessment & Plan Note (Signed)
Overall improved.  Monitor.

## 2022-09-17 NOTE — Assessment & Plan Note (Signed)
Last dexa showed improvement. Continue fosamax and caltrate bid.

## 2022-09-17 NOTE — Progress Notes (Signed)
Subjective:   By signing my name below, I, Shehryar Baig, attest that this documentation has been prepared under the direction and in the presence of [name], NP. 09/17/2022   Patient ID: Ruth Jensen, female    DOB: 11-Feb-1951, 72 y.o.   MRN: [id number]  Chief Complaint  Patient presents with   Hypertension    Here for follow up   B12 deficiency    Will get b12 shot today    HPI Patient is in today for a follow up visit.   Blood pressure: Her blood pressure is stable during this visit while taking 50 mg metoprolol succinate and 2.5 mg lisinopril.  BP Readings from Last 3 Encounters:  09/17/22 131/63  08/13/22 (!) 142/61  06/26/22 (!) 152/62   Pulse Readings from Last 3 Encounters:  09/17/22 94  08/13/22 70  06/26/22 80   Cholesterol: Her last cholesterol levels looked good while taking 40 mg atorvastatin daily PO.  Lab Results  Component Value Date   CHOL 176 05/07/2022   HDL 53.90 05/07/2022   LDLCALC 105 (H) 05/07/2022   TRIG 87.0 05/07/2022   CHOLHDL 3 05/07/2022   Vertigo: She has no episodes of vertigo since her last visit.   Right shoulder pain: She continues having right shoulder pain that typically present while laying down. She is following up with her sports medicine specialist to manage her symptoms.  Cough: Her cough has improved since last visit.   Reflux: She has no recent episodes of reflux.   Rhinorrhea: She reports having rhinorrhea last week but her symptoms have resolved at this time.  Bone health: She continues taking fosamax once every week. She also continues taking calcium supplements 2x daily PO. Her last bone density showed improvement.  Blood count: She had anemia during her last blood count. She has an [place] lab appointment with her hematologist on 12/24/2022.  Lab Results  Component Value Date   IRON 88 06/26/2022   TIBC 386 06/26/2022   FERRITIN 7 (L) 06/26/2022   Immunizations: She never received the shingles  vaccine due to it being too expensive for her.    Past Medical History:  Diagnosis Date   Abnormal echocardiogram 07/22/2019   Abnormal EKG 07/22/2019   Acute deep vein thrombosis (DVT) of popliteal vein of right lower extremity ([place]) 07/01/2019   Acute pulmonary embolism ([place]) 07/02/2019   Chest pain 07/22/2019   Depressed left ventricular ejection fraction 40/98/1191   Diastolic dysfunction    DVT (deep venous thrombosis) ([place]) 07/01/2019   History of DVT (deep vein thrombosis) 07/01/2019   History of pulmonary embolism 07/02/2019   Hypertension    Lipid screening 07/22/2019   Obesity (BMI 30-39.9) 07/22/2019   Osteoporosis 11/18/2019   Pulmonary embolism ([place]) 07/02/2019   Single subsegmental pulmonary embolism without acute cor pulmonale ([place]) 07/01/2019    Past Surgical History:  Procedure Laterality Date   ABDOMINAL HYSTERECTOMY  2010   due to fibroids   BREAST BIOPSY Left    BREAST CYST EXCISION Left    COLONOSCOPY     [facility]. Late 2000's    LEFT HEART CATH AND CORONARY ANGIOGRAPHY N/A 08/03/2019   Procedure: LEFT HEART CATH AND CORONARY ANGIOGRAPHY;  Surgeon: Lorretta Harp, MD;  Location: [facility];  Service: Cardiovascular;  Laterality: N/A;   mitral valve regurg  2023   moderat per 2D echo   OOPHORECTOMY      Family History  Problem Relation Age of Onset  Hypertension Mother    CVA Mother        hemiparesis   Hypertension Father    Dementia Father    Prostate cancer Father    Hypertension Sister    CAD Sister        scheduled for pacemaker   Hypertension Sister    Skin cancer Sister        melanoma died at age 19   Hypertension Sister        ? PFO repair   Parkinson's disease Sister    Asthma Paternal Grandfather    Hypertension Daughter    Hypertension Son        pacemaker   Colon cancer Neg Hx    Esophageal cancer Neg Hx     Social History   Socioeconomic History   Marital status: Married    Spouse name: Truman Hayward    Number of children: 2   Years of education: Not on file   Highest education level: Not on file  Occupational History   Occupation: retired   Tobacco Use   Smoking status: Never   Smokeless tobacco: Never  Vaping Use   Vaping Use: Never used  Substance and Sexual Activity   Alcohol use: Not Currently   Drug use: Never   Sexual activity: Not Currently  Other Topics Concern   Not on file  Social History Narrative   Married   2 children (son and daughter) live locally   4 grandchildren   Retired   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   No pets   Enjoys spending time with grandchildren, baseball games, walking   Social Determinants of Health   Financial Resource Strain: Low Risk  (02/22/2021)   Overall Financial Resource Strain (CARDIA)    Difficulty of Paying Living Expenses: Not hard at all  Food Insecurity: No Food Insecurity (02/22/2021)   Hunger Vital Sign    Worried About Running Out of Food in the Last Year: Never true    [place] in the Last Year: Never true  Transportation Needs: No Transportation Needs (02/22/2021)   PRAPARE - Hydrologist (Medical): No    Lack of Transportation (Non-Medical): No  Physical Activity: Inactive (02/22/2021)   Exercise Vital Sign    Days of Exercise per Week: 0 days    Minutes of Exercise per Session: 0 min  Stress: No Stress Concern Present (02/22/2021)   [place]    Feeling of Stress : Not at all  Social Connections: Moderately Integrated (02/22/2021)   Social Connection and Isolation Panel [NHANES]    Frequency of Communication with Friends and Family: More than three times a week    Frequency of Social Gatherings with Friends and Family: More than three times a week    Attends Religious Services: More than 4 times per year    Active Member of [employer] or Organizations: No    Attends Archivist Meetings: Never     Marital Status: Married  Human resources officer Violence: Not At Risk (02/22/2021)   Humiliation, Afraid, Rape, and Kick questionnaire    Fear of Current or Ex-Partner: No    Emotionally Abused: No    Physically Abused: No    Sexually Abused: No    Outpatient Medications Prior to Visit  Medication Sig Dispense Refill   alendronate (FOSAMAX) 70 MG tablet TAKE 1 TABLET(70 MG) BY MOUTH EVERY 7 DAYS WITH A FULL GLASS  OF WATER AND ON AN EMPTY STOMACH 4 tablet 11   aspirin EC 325 MG tablet Take 1 tablet (325 mg total) by mouth daily.     atorvastatin (LIPITOR) 40 MG tablet Take 1 tablet (40 mg total) by mouth daily. 90 tablet 1   Calcium Carbonate-Vit D-Min (CALTRATE 600+D PLUS MINERALS) 600-800 MG-UNIT TABS Take 1 tablet by mouth twice daily     Cholecalciferol (VITAMIN D3) 10 MCG (400 UNIT) CAPS Take by mouth.     COVID-19 mRNA bivalent vaccine, Pfizer, (PFIZER COVID-19 VAC BIVALENT) injection Inject into the muscle. 0.3 mL 0   COVID-19 mRNA vaccine 2023-2024 (COMIRNATY) SUSP injection Inject into the muscle. 0.3 mL 0   Iron, Ferrous Sulfate, 325 (65 Fe) MG TABS Take 325 mg by mouth every other day. (Patient not taking: Reported on 06/27/2022) 30 tablet    lisinopril (ZESTRIL) 2.5 MG tablet TAKE 1 TABLET(2.5 MG) BY MOUTH DAILY 90 tablet 0   metoprolol succinate (TOPROL-XL) 50 MG 24 hr tablet Take 1 tablet (50 mg total) by mouth daily. Take with or immediately following a meal. 90 tablet 1   nitroGLYCERIN (NITROSTAT) 0.4 MG SL tablet Place 1 tablet (0.4 mg total) under the tongue every 5 (five) minutes as needed. 30 tablet 3   No facility-administered medications prior to visit.    No Known Allergies  ROS    See HPI Objective:    Physical Exam Constitutional:      General: She is not in acute distress.    Appearance: Normal appearance. She is well-developed.  HENT:     Head: Normocephalic and atraumatic.     Right Ear: External ear normal.     Left Ear: External ear normal.  Eyes:      General: No scleral icterus. Neck:     Thyroid: No thyromegaly.  Cardiovascular:     Rate and Rhythm: Normal rate and regular rhythm.     Heart sounds: Normal heart sounds. No murmur heard. Pulmonary:     Effort: Pulmonary effort is normal. No respiratory distress.     Breath sounds: Normal breath sounds. No wheezing.  Musculoskeletal:     Cervical back: Neck supple.  Skin:    General: Skin is warm and dry.  Neurological:     Mental Status: She is alert and oriented to person, place, and time.  Psychiatric:        Mood and Affect: Mood normal.        Behavior: Behavior normal.        Thought Content: Thought content normal.        Judgment: Judgment normal.     BP 131/63 (BP Location: Right Arm, Patient Position: Sitting, Cuff Size: Large)   Pulse 94   Temp 98 F (36.7 C) (Oral)   Resp 16   Wt 195 lb (88.5 kg)   SpO2 100%   BMI 36.84 kg/m  Wt Readings from Last 3 Encounters:  09/17/22 195 lb (88.5 kg)  08/13/22 197 lb (89.4 kg)  06/26/22 196 lb 12.8 oz (89.3 kg)       Assessment & Plan:  B12 deficiency -     Cyanocobalamin  Essential hypertension Assessment & Plan: BP Readings from Last 3 Encounters:  09/17/22 131/63  08/13/22 (!) 142/61  06/26/22 (!) 152/62   At goal on metoprolol and lisinopril. Continue same.    Benign paroxysmal positional vertigo, unspecified laterality Assessment & Plan: No recent symptoms.  Monitor.    Adhesive capsulitis of right shoulder Assessment &  Plan: Overall improved.  Monitor.    Cough, unspecified type Assessment & Plan: Resolved.    Gastroesophageal reflux disease, unspecified whether esophagitis present Assessment & Plan: Stable without medication.    Osteoporosis, unspecified osteoporosis type, unspecified pathological fracture presence Assessment & Plan: Last dexa showed improvement. Continue fosamax and caltrate bid.    Iron deficiency anemia due to chronic blood loss Assessment & Plan: Lab Results   Component Value Date   WBC 8.3 06/26/2022   HGB 11.3 (L) 06/26/2022   HCT 37.4 06/26/2022   MCV 84.2 06/26/2022   PLT 268 06/26/2022   She is scheduled for follow up iron studies with hematology in April.    Mixed hyperlipidemia Assessment & Plan: Lab Results  Component Value Date   CHOL 176 05/07/2022   HDL 53.90 05/07/2022   LDLCALC 105 (H) 05/07/2022   TRIG 87.0 05/07/2022   CHOLHDL 3 05/07/2022   Last LDL was stable on lipitor. Continue  same.      I, Nance Pear, NP, personally preformed the services described in this documentation.  All medical record entries made by the scribe were at my direction and in my presence.  I have reviewed the chart and discharge instructions (if applicable) and agree that the record reflects my personal performance and is accurate and complete. 09/17/2022   I,Shehryar Baig,acting as a Education administrator for Nance Pear, NP.,have documented all relevant documentation on the behalf of Nance Pear, NP,as directed by  Nance Pear, NP while in the presence of Nance Pear, NP.   Nance Pear, NP

## 2022-09-17 NOTE — Assessment & Plan Note (Signed)
Lab Results  Component Value Date   CHOL 176 05/07/2022   HDL 53.90 05/07/2022   LDLCALC 105 (H) 05/07/2022   TRIG 87.0 05/07/2022   CHOLHDL 3 05/07/2022   Last LDL was stable on lipitor. Continue  same.

## 2022-09-17 NOTE — Assessment & Plan Note (Signed)
No recent symptoms.  Monitor.

## 2022-09-17 NOTE — Assessment & Plan Note (Signed)
Lab Results  Component Value Date   WBC 8.3 06/26/2022   HGB 11.3 (L) 06/26/2022   HCT 37.4 06/26/2022   MCV 84.2 06/26/2022   PLT 268 06/26/2022   She is scheduled for follow up iron studies with hematology in April.

## 2022-09-17 NOTE — Assessment & Plan Note (Signed)
Resolved

## 2022-09-17 NOTE — Assessment & Plan Note (Signed)
Stable without medication  

## 2022-09-17 NOTE — Assessment & Plan Note (Signed)
BP Readings from Last 3 Encounters:  09/17/22 131/63  08/13/22 (!) 142/61  06/26/22 (!) 152/62   At goal on metoprolol and lisinopril. Continue same.

## 2022-09-19 ENCOUNTER — Other Ambulatory Visit: Payer: Self-pay | Admitting: Family

## 2022-10-18 ENCOUNTER — Ambulatory Visit (INDEPENDENT_AMBULATORY_CARE_PROVIDER_SITE_OTHER): Payer: Medicare PPO

## 2022-10-18 DIAGNOSIS — E538 Deficiency of other specified B group vitamins: Secondary | ICD-10-CM

## 2022-10-18 MED ORDER — CYANOCOBALAMIN 1000 MCG/ML IJ SOLN
1000.0000 ug | Freq: Once | INTRAMUSCULAR | Status: AC
  Administered 2022-10-18: 1000 ug via INTRAMUSCULAR

## 2022-10-18 NOTE — Progress Notes (Addendum)
Ruth Jensen is a 72 y.o. female presents to the office today for B12 injections, per physician's orders. Original order: per last ov 09/17/22 to continue once a month B12 injections. Cyanocobalamin (med), 1000 mg/ml (dose),  im (route) was administered right deltoid (location) today. Patient tolerated injection. Patient due for follow up labs/provider appt: Yes. Date due: 01/16/23, appt made Yes Patient next injection due: in one month, appt made Yes  Jiles Prows

## 2022-10-19 ENCOUNTER — Other Ambulatory Visit: Payer: Self-pay | Admitting: Family

## 2022-10-19 MED ORDER — ATORVASTATIN CALCIUM 40 MG PO TABS: 40.0000 mg | ORAL_TABLET | Freq: Every day | ORAL | 1 refills | Status: DC

## 2022-11-14 ENCOUNTER — Encounter: Payer: Self-pay | Admitting: Cardiology

## 2022-11-14 ENCOUNTER — Ambulatory Visit: Payer: Medicare PPO | Attending: Cardiology | Admitting: Cardiology

## 2022-11-14 VITALS — BP 164/88 | HR 68 | Ht 61.0 in | Wt 196.0 lb

## 2022-11-14 DIAGNOSIS — Z8673 Personal history of transient ischemic attack (TIA), and cerebral infarction without residual deficits: Secondary | ICD-10-CM | POA: Diagnosis not present

## 2022-11-14 DIAGNOSIS — Z79899 Other long term (current) drug therapy: Secondary | ICD-10-CM

## 2022-11-14 DIAGNOSIS — I10 Essential (primary) hypertension: Secondary | ICD-10-CM

## 2022-11-14 DIAGNOSIS — E782 Mixed hyperlipidemia: Secondary | ICD-10-CM

## 2022-11-14 MED ORDER — LISINOPRIL 5 MG PO TABS: 5.0000 mg | ORAL_TABLET | Freq: Every day | ORAL | 3 refills | Status: DC

## 2022-11-14 NOTE — Progress Notes (Signed)
Cardiology Office Note:    Date:  11/18/2022   ID:  Ruth Jensen, DOB 08-13-1951, MRN [id number]  PCP:  [name], NP  Cardiologist:  Berniece Salines, DO  Electrophysiologist:  None   Referring MD: [name], NP   " I am doing ok"   History of Present Illness:    Ruth Jensen is a 72 y.o. female with a hx of pulmonary embolism and DVT on Xarelto, depressed LVEF 40 to 45% which had recovered in 20 21-50 5 to 60%-nonischemic cardiomyopathy heart catheterization in 2020 showed evidence of no coronary artery disease.  Her last visit with our practice was in September 2023 when she followed with Coletta Memos, NP at that time she was experiencing irregular heartbeat.  During that visit she was continued on metoprolol, it was recommended she wear monitor but she declined.  Since she was seen in September 2023 she denies any hospitalizations or any ED visits or any urgent care visit.  She offers no complaints at this time.  Past Medical History:  Diagnosis Date   Abnormal echocardiogram 07/22/2019   Abnormal EKG 07/22/2019   Acute deep vein thrombosis (DVT) of popliteal vein of right lower extremity ([place]) 07/01/2019   Acute pulmonary embolism ([place]) 07/02/2019   Chest pain 07/22/2019   Depressed left ventricular ejection fraction 83/38/2505   Diastolic dysfunction    DVT (deep venous thrombosis) ([place]) 07/01/2019   History of DVT (deep vein thrombosis) 07/01/2019   History of pulmonary embolism 07/02/2019   Hypertension    Lipid screening 07/22/2019   Obesity (BMI 30-39.9) 07/22/2019   Osteoporosis 11/18/2019   Pulmonary embolism ([place]) 07/02/2019   Single subsegmental pulmonary embolism without acute cor pulmonale ([place]) 07/01/2019    Past Surgical History:  Procedure Laterality Date   ABDOMINAL HYSTERECTOMY  2010   due to fibroids   BREAST BIOPSY Left    BREAST CYST EXCISION Left    COLONOSCOPY     [facility]. Late 2000's    LEFT HEART CATH AND CORONARY  ANGIOGRAPHY N/A 08/03/2019   Procedure: LEFT HEART CATH AND CORONARY ANGIOGRAPHY;  Surgeon: Lorretta Harp, MD;  Location: [facility];  Service: Cardiovascular;  Laterality: N/A;   mitral valve regurg  2023   moderat per 2D echo   OOPHORECTOMY      Current Medications: Current Meds  Medication Sig   alendronate (FOSAMAX) 70 MG tablet TAKE 1 TABLET(70 MG) BY MOUTH EVERY 7 DAYS WITH A FULL GLASS OF WATER AND ON AN EMPTY STOMACH   aspirin EC 325 MG tablet Take 1 tablet (325 mg total) by mouth daily.   atorvastatin (LIPITOR) 40 MG tablet Take 1 tablet (40 mg total) by mouth daily.   Calcium Carbonate-Vit D-Min (CALTRATE 600+D PLUS MINERALS) 600-800 MG-UNIT TABS Take 1 tablet by mouth twice daily   Cholecalciferol (VITAMIN D3) 10 MCG (400 UNIT) CAPS Take by mouth.   COVID-19 mRNA bivalent vaccine, Pfizer, (PFIZER COVID-19 VAC BIVALENT) injection Inject into the muscle.   COVID-19 mRNA vaccine 2023-2024 (COMIRNATY) SUSP injection Inject into the muscle.   Iron, Ferrous Sulfate, 325 (65 Fe) MG TABS Take 325 mg by mouth every other day.   lisinopril (ZESTRIL) 5 MG tablet Take 1 tablet (5 mg total) by mouth daily.   metoprolol succinate (TOPROL-XL) 50 MG 24 hr tablet Take 1 tablet (50 mg total) by mouth daily. Take with or immediately following a meal.   nitroGLYCERIN (NITROSTAT) 0.4 MG SL tablet Place 1 tablet (0.4  mg total) under the tongue every 5 (five) minutes as needed.   [DISCONTINUED] lisinopril (ZESTRIL) 2.5 MG tablet TAKE 1 TABLET(2.5 MG) BY MOUTH DAILY     Allergies:   Patient has no known allergies.   Social History   Socioeconomic History   Marital status: Married    Spouse name: Truman Hayward   Number of children: 2   Years of education: Not on file   Highest education level: Not on file  Occupational History   Occupation: retired   Tobacco Use   Smoking status: Never   Smokeless tobacco: Never  Vaping Use   Vaping Use: Never used  Substance and Sexual Activity    Alcohol use: Not Currently   Drug use: Never   Sexual activity: Not Currently  Other Topics Concern   Not on file  Social History Narrative   Married   2 children (son and daughter) live locally   4 grandchildren   Retired   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   No pets   Enjoys spending time with grandchildren, baseball games, walking   Social Determinants of Health   Financial Resource Strain: Low Risk  (02/22/2021)   Overall Financial Resource Strain (CARDIA)    Difficulty of Paying Living Expenses: Not hard at all  Food Insecurity: No Food Insecurity (02/22/2021)   Hunger Vital Sign    Worried About Running Out of Food in the Last Year: Never true    [place] in the Last Year: Never true  Transportation Needs: No Transportation Needs (02/22/2021)   PRAPARE - Hydrologist (Medical): No    Lack of Transportation (Non-Medical): No  Physical Activity: Inactive (02/22/2021)   Exercise Vital Sign    Days of Exercise per Week: 0 days    Minutes of Exercise per Session: 0 min  Stress: No Stress Concern Present (02/22/2021)   [place]    Feeling of Stress : Not at all  Social Connections: Moderately Integrated (02/22/2021)   Social Connection and Isolation Panel [NHANES]    Frequency of Communication with Friends and Family: More than three times a week    Frequency of Social Gatherings with Friends and Family: More than three times a week    Attends Religious Services: More than 4 times per year    Active Member of [employer] or Organizations: No    Attends Music therapist: Never    Marital Status: Married     Family History: The patient's family history includes Asthma in her paternal grandfather; CAD in her sister; CVA in her mother; Dementia in her father; Hypertension in her daughter, father, mother, sister, sister, sister, and son; Parkinson's disease in her  sister; Prostate cancer in her father; Skin cancer in her sister. There is no history of Colon cancer or Esophageal cancer.  ROS:   Review of Systems  Constitution: Negative for decreased appetite, fever and weight gain.  HENT: Negative for congestion, ear discharge, hoarse voice and sore throat.   Eyes: Negative for discharge, redness, vision loss in right eye and visual halos.  Cardiovascular: Negative for chest pain, dyspnea on exertion, leg swelling, orthopnea and palpitations.  Respiratory: Negative for cough, hemoptysis, shortness of breath and snoring.   Endocrine: Negative for heat intolerance and polyphagia.  Hematologic/Lymphatic: Negative for bleeding problem. Does not bruise/bleed easily.  Skin: Negative for flushing, nail changes, rash and suspicious lesions.  Musculoskeletal: Negative for arthritis,  joint pain, muscle cramps, myalgias, neck pain and stiffness.  Gastrointestinal: Negative for abdominal pain, bowel incontinence, diarrhea and excessive appetite.  Genitourinary: Negative for decreased libido, genital sores and incomplete emptying.  Neurological: Negative for brief paralysis, focal weakness, headaches and loss of balance.  Psychiatric/Behavioral: Negative for altered mental status, depression and suicidal ideas.  Allergic/Immunologic: Negative for HIV exposure and persistent infections.    EKGs/Labs/Other Studies Reviewed:    The following studies were reviewed today:   EKG:  The ekg ordered today demonstrates   May 15, 2022 IMPRESSIONS   1. Left ventricular ejection fraction, by estimation, is 60 to 65%. The  left ventricle has normal function. The left ventricle has no regional  wall motion abnormalities. Left ventricular diastolic parameters are  consistent with Grade I diastolic  dysfunction (impaired relaxation).   2. Right ventricular systolic function is normal. The right ventricular  size is normal. There is normal pulmonary artery systolic  pressure.   3. Left atrial size was moderately dilated.   4. The mitral valve is normal in structure. Mild to moderate mitral valve  regurgitation. No evidence of mitral stenosis.   5. The aortic valve is normal in structure. Aortic valve regurgitation is  not visualized. No aortic stenosis is present.   6. The inferior vena cava is normal in size with greater than 50%  respiratory variability, suggesting right atrial pressure of 3 mmHg.Marland Kitchen     Recent Labs: 06/26/2022: Hemoglobin 11.3; Platelet Count 268 11/14/2022: ALT 13; BUN 13; Creatinine, Ser 0.68; Magnesium 2.1; Potassium 4.2; Sodium 145  Recent Lipid Panel    Component Value Date/Time   CHOL 176 05/07/2022 1110   CHOL 234 (H) 07/22/2019 1418   TRIG 87.0 05/07/2022 1110   HDL 53.90 05/07/2022 1110   HDL 51 07/22/2019 1418   CHOLHDL 3 05/07/2022 1110   VLDL 17.4 05/07/2022 1110   LDLCALC 105 (H) 05/07/2022 1110   LDLCALC 145 (H) 04/14/2021 1447    Physical Exam:    VS:  BP (!) 164/88   Pulse 68   Ht 5\' 1"  (1.549 m)   Wt 196 lb (88.9 kg)   BMI 37.03 kg/m     Wt Readings from Last 3 Encounters:  11/14/22 196 lb (88.9 kg)  09/17/22 195 lb (88.5 kg)  08/13/22 197 lb (89.4 kg)     GEN: Well nourished, well developed in no acute distress HEENT: Normal NECK: No JVD; No carotid bruits LYMPHATICS: No lymphadenopathy CARDIAC: S1S2 noted,RRR, no murmurs, rubs, gallops RESPIRATORY:  Clear to auscultation without rales, wheezing or rhonchi  ABDOMEN: Soft, non-tender, non-distended, +bowel sounds, no guarding. EXTREMITIES: No edema, No cyanosis, no clubbing MUSCULOSKELETAL:  No deformity  SKIN: Warm and dry NEUROLOGIC:  Alert and oriented x 3, non-focal PSYCHIATRIC:  Normal affect, good insight  ASSESSMENT:    1. Medication management    PLAN:    Blood pressure is elevated in the office we will increase her lisinopril to 5 mg daily.  Will continue the metoprolol succinate at the dose. I will see the patient back in  follow-up she will need if there is no improvement we will stop the lisinopril at that time we will transition her to an ARB.  The patient understands the need to lose weight with diet and exercise. We have discussed specific strategies for this.  Hyperlipidemia - continue with current statin medication.  The patient is in agreement with the above plan. The patient left the office in stable condition.  The patient  will follow up in   Medication Adjustments/Labs and Tests Ordered: Current medicines are reviewed at length with the patient today.  Concerns regarding medicines are outlined above.  Orders Placed This Encounter  Procedures   Comprehensive Metabolic Panel (CMET)   Magnesium   Meds ordered this encounter  Medications   lisinopril (ZESTRIL) 5 MG tablet    Sig: Take 1 tablet (5 mg total) by mouth daily.    Dispense:  90 tablet    Refill:  3    Patient Instructions  Medication Instructions:  Your physician recommends that you continue on your current medications as directed. Please refer to the Current Medication list given to you today.  *If you need a refill on your cardiac medications before your next appointment, please call your pharmacy*   Lab Work: Your physician recommends that you have labs drawn today: CMET, Mag If you have labs (blood work) drawn today and your tests are completely normal, you will receive your results only by: MyChart Message (if you have MyChart) OR A paper copy in the mail If you have any lab test that is abnormal or we need to change your treatment, we will call you to review the results.   Testing/Procedures: None   Follow-Up: At [facility], you and your health needs are our priority.  As part of our continuing mission to provide you with exceptional heart care, we have created designated Provider Care Teams.  These Care Teams include your primary Cardiologist (physician) and Advanced Practice Providers (APPs -  Physician  Assistants and Nurse Practitioners) who all work together to provide you with the care you need, when you need it.  We recommend signing up for the patient portal called "MyChart".  Sign up information is provided on this After Visit Summary.  MyChart is used to connect with patients for Virtual Visits (Telemedicine).  Patients are able to view lab/test results, encounter notes, upcoming appointments, etc.  Non-urgent messages can be sent to your provider as well.   To learn more about what you can do with MyChart, go to [email or web address].    Your next appointment:   1 year(s)  Provider:   Berniece Salines, DO    Adopting a Healthy Lifestyle.  Know what a healthy weight is for you (roughly BMI <25) and aim to maintain this   Aim for 7+ servings of fruits and vegetables daily   65-80+ fluid ounces of water or unsweet tea for healthy kidneys   Limit to max 1 drink of alcohol per day; avoid smoking/tobacco   Limit animal fats in diet for cholesterol and heart health - choose grass fed whenever available   Avoid highly processed foods, and foods high in saturated/trans fats   Aim for low stress - take time to unwind and care for your mental health   Aim for 150 min of moderate intensity exercise weekly for heart health, and weights twice weekly for bone health   Aim for 7-9 hours of sleep daily   When it comes to diets, agreement about the perfect plan isnt easy to find, even among the experts. Experts at the [employer] developed an idea known as the Healthy Eating Plate. Just imagine a plate divided into logical, healthy portions.   The emphasis is on diet quality:   Load up on vegetables and fruits - one-half of your plate: Aim for color and variety, and remember that potatoes dont count.   Go for whole grains - one-quarter  of your plate: Whole wheat, barley, wheat berries, quinoa, oats, brown rice, and foods made with them. If you want pasta, go with whole  wheat pasta.   Protein power - one-quarter of your plate: Fish, chicken, beans, and nuts are all healthy, versatile protein sources. Limit red meat.   The diet, however, does go beyond the plate, offering a few other suggestions.   Use healthy plant oils, such as olive, canola, soy, corn, sunflower and peanut. Check the labels, and avoid partially hydrogenated oil, which have unhealthy trans fats.   If youre thirsty, drink water. Coffee and tea are good in moderation, but skip sugary drinks and limit milk and dairy products to one or two daily servings.   The type of carbohydrate in the diet is more important than the amount. Some sources of carbohydrates, such as vegetables, fruits, whole grains, and beans-are healthier than others.   Finally, stay active  Signed, Berniece Salines, DO  11/18/2022 11:07 AM    [place]

## 2022-11-14 NOTE — Patient Instructions (Signed)
Medication Instructions:  Your physician recommends that you continue on your current medications as directed. Please refer to the Current Medication list given to you today.  *If you need a refill on your cardiac medications before your next appointment, please call your pharmacy*   Lab Work: Your physician recommends that you have labs drawn today: CMET, Mag If you have labs (blood work) drawn today and your tests are completely normal, you will receive your results only by: MyChart Message (if you have MyChart) OR A paper copy in the mail If you have any lab test that is abnormal or we need to change your treatment, we will call you to review the results.   Testing/Procedures: None   Follow-Up: At [facility], you and your health needs are our priority.  As part of our continuing mission to provide you with exceptional heart care, we have created designated Provider Care Teams.  These Care Teams include your primary Cardiologist (physician) and Advanced Practice Providers (APPs -  Physician Assistants and Nurse Practitioners) who all work together to provide you with the care you need, when you need it.  We recommend signing up for the patient portal called "MyChart".  Sign up information is provided on this After Visit Summary.  MyChart is used to connect with patients for Virtual Visits (Telemedicine).  Patients are able to view lab/test results, encounter notes, upcoming appointments, etc.  Non-urgent messages can be sent to your provider as well.   To learn more about what you can do with MyChart, go to [email or web address].    Your next appointment:   1 year(s)  Provider:   Berniece Salines, DO

## 2022-11-15 ENCOUNTER — Ambulatory Visit: Payer: Medicare PPO

## 2022-11-15 LAB — COMPREHENSIVE METABOLIC PANEL
ALT: 13 IU/L (ref 0–32)
AST: 17 IU/L (ref 0–40)
Albumin/Globulin Ratio: 1.5 (ref 1.2–2.2)
Albumin: 4 g/dL (ref 3.8–4.8)
Alkaline Phosphatase: 112 IU/L (ref 44–121)
BUN/Creatinine Ratio: 19 (ref 12–28)
BUN: 13 mg/dL (ref 8–27)
Bilirubin Total: 0.6 mg/dL (ref 0.0–1.2)
CO2: 26 mmol/L (ref 20–29)
Calcium: 9.6 mg/dL (ref 8.7–10.3)
Chloride: 107 mmol/L — ABNORMAL HIGH (ref 96–106)
Creatinine, Ser: 0.68 mg/dL (ref 0.57–1.00)
Globulin, Total: 2.7 g/dL (ref 1.5–4.5)
Glucose: 82 mg/dL (ref 70–99)
Potassium: 4.2 mmol/L (ref 3.5–5.2)
Sodium: 145 mmol/L — ABNORMAL HIGH (ref 134–144)
Total Protein: 6.7 g/dL (ref 6.0–8.5)
eGFR: 92 mL/min/{1.73_m2} (ref >59)

## 2022-11-15 LAB — MAGNESIUM: Magnesium: 2.1 mg/dL (ref 1.6–2.3)

## 2022-11-16 ENCOUNTER — Ambulatory Visit: Payer: Medicare PPO

## 2022-11-19 NOTE — Progress Notes (Unsigned)
Ruth Jensen is a 72 y.o. female presents to the office today for B12 injections, per physician's orders. Original order: per last ov 09/17/22 to continue once a month B12 injections. Cyanocobalamin, 1000 mg/ml,  im was administered L  deltoid (location) today. Patient tolerated injection. Patient due for follow up labs/provider appt: Yes. Date due: 01/16/23, appt made Yes Patient next injection due: in one month, appt made yes   [place], [name] L

## 2022-11-20 ENCOUNTER — Ambulatory Visit (INDEPENDENT_AMBULATORY_CARE_PROVIDER_SITE_OTHER): Payer: Medicare PPO

## 2022-11-20 DIAGNOSIS — E538 Deficiency of other specified B group vitamins: Secondary | ICD-10-CM

## 2022-11-20 MED ORDER — CYANOCOBALAMIN 1000 MCG/ML IJ SOLN
1000.0000 ug | Freq: Once | INTRAMUSCULAR | Status: AC
  Administered 2022-11-20: 1000 ug via INTRAMUSCULAR

## 2022-12-11 ENCOUNTER — Encounter: Payer: Self-pay | Admitting: *Deleted

## 2022-12-21 ENCOUNTER — Ambulatory Visit (INDEPENDENT_AMBULATORY_CARE_PROVIDER_SITE_OTHER): Payer: Medicare PPO

## 2022-12-21 DIAGNOSIS — E538 Deficiency of other specified B group vitamins: Secondary | ICD-10-CM

## 2022-12-21 MED ORDER — CYANOCOBALAMIN 1000 MCG/ML IJ SOLN
1000.0000 ug | Freq: Once | INTRAMUSCULAR | Status: AC
  Administered 2022-12-21: 1000 ug via INTRAMUSCULAR

## 2022-12-21 NOTE — Progress Notes (Signed)
Ruth Jensen is a 72 y.o. female presents to the office today for B12 injections, per physician's orders. Original order: per last ov 09/17/22 to continue once a month B12 injections. Cyanocobalamin, 1000 mg/ml, im was administered L deltoid (location) today. Patient tolerated injection. Patient due for follow up labs/provider appt: Yes. Date due: 01/16/23, appt made Yes

## 2022-12-24 ENCOUNTER — Other Ambulatory Visit: Payer: Medicare PPO

## 2022-12-24 ENCOUNTER — Ambulatory Visit: Payer: Medicare PPO | Admitting: Family

## 2023-01-11 ENCOUNTER — Inpatient Hospital Stay: Payer: Medicare PPO

## 2023-01-11 ENCOUNTER — Inpatient Hospital Stay: Payer: Medicare PPO | Admitting: Family

## 2023-01-16 ENCOUNTER — Ambulatory Visit (INDEPENDENT_AMBULATORY_CARE_PROVIDER_SITE_OTHER): Payer: Medicare PPO | Admitting: Family

## 2023-01-16 VITALS — BP 139/74 | HR 57 | Temp 98.1°F | Resp 16 | Wt 198.0 lb

## 2023-01-16 DIAGNOSIS — E782 Mixed hyperlipidemia: Secondary | ICD-10-CM

## 2023-01-16 DIAGNOSIS — D500 Iron deficiency anemia secondary to blood loss (chronic): Secondary | ICD-10-CM

## 2023-01-16 DIAGNOSIS — M810 Age-related osteoporosis without current pathological fracture: Secondary | ICD-10-CM | POA: Diagnosis not present

## 2023-01-16 DIAGNOSIS — I10 Essential (primary) hypertension: Secondary | ICD-10-CM | POA: Diagnosis not present

## 2023-01-16 DIAGNOSIS — E538 Deficiency of other specified B group vitamins: Secondary | ICD-10-CM | POA: Diagnosis not present

## 2023-01-16 LAB — BASIC METABOLIC PANEL
BUN: 12 mg/dL (ref 6–23)
CO2: 30 mEq/L (ref 19–32)
Calcium: 9.5 mg/dL (ref 8.4–10.5)
Chloride: 105 mEq/L (ref 96–112)
Creatinine, Ser: 0.74 mg/dL (ref 0.40–1.20)
GFR: 80.44 mL/min (ref >60.00)
Glucose, Bld: 74 mg/dL (ref 70–99)
Potassium: 4.4 mEq/L (ref 3.5–5.1)
Sodium: 141 mEq/L (ref 135–145)

## 2023-01-16 LAB — CBC WITH DIFFERENTIAL/PLATELET
Basophils Absolute: 0.1 10*3/uL (ref 0.0–0.1)
Basophils Relative: 0.7 % (ref 0.0–3.0)
Eosinophils Absolute: 0.2 10*3/uL (ref 0.0–0.7)
Eosinophils Relative: 3.3 % (ref 0.0–5.0)
HCT: 37.3 % (ref 36.0–46.0)
Hemoglobin: 11.6 g/dL — ABNORMAL LOW (ref 12.0–15.0)
Lymphocytes Relative: 33 % (ref 12.0–46.0)
Lymphs Abs: 2.3 10*3/uL (ref 0.7–4.0)
MCHC: 31.2 g/dL (ref 30.0–36.0)
MCV: 83.2 fl (ref 78.0–100.0)
Monocytes Absolute: 0.7 10*3/uL (ref 0.1–1.0)
Monocytes Relative: 9.5 % (ref 3.0–12.0)
Neutro Abs: 3.7 10*3/uL (ref 1.4–7.7)
Neutrophils Relative %: 53.5 % (ref 43.0–77.0)
Platelets: 280 10*3/uL (ref 150.0–400.0)
RBC: 4.49 Mil/uL (ref 3.87–5.11)
RDW: 14.8 % (ref 11.5–15.5)
WBC: 6.9 10*3/uL (ref 4.0–10.5)

## 2023-01-16 LAB — LIPID PANEL
Cholesterol: 160 mg/dL (ref 0–200)
HDL: 48.7 mg/dL (ref >39.00)
LDL Cholesterol: 93 mg/dL (ref 0–99)
NonHDL: 110.99
Total CHOL/HDL Ratio: 3
Triglycerides: 89 mg/dL (ref 0.0–149.0)
VLDL: 17.8 mg/dL (ref 0.0–40.0)

## 2023-01-16 MED ORDER — NITROGLYCERIN 0.4 MG SL SUBL: 0.4000 mg | SUBLINGUAL_TABLET | SUBLINGUAL | 0 refills | Status: AC | PRN

## 2023-01-16 MED ORDER — ASPIRIN 81 MG PO TBEC: 162.0000 mg | DELAYED_RELEASE_TABLET | Freq: Every day | ORAL | 12 refills | Status: AC

## 2023-01-16 MED ORDER — ALENDRONATE SODIUM 70 MG PO TABS: ORAL_TABLET | ORAL | 4 refills | Status: DC

## 2023-01-16 MED ORDER — CYANOCOBALAMIN 1000 MCG/ML IJ SOLN
1000.0000 ug | Freq: Once | INTRAMUSCULAR | Status: AC
Start: 2023-01-16 — End: 2023-01-16
  Administered 2023-01-16: 1000 ug via INTRAMUSCULAR

## 2023-01-16 MED ORDER — METOPROLOL SUCCINATE ER 50 MG PO TB24: 50.0000 mg | ORAL_TABLET | Freq: Every day | ORAL | 1 refills | Status: DC

## 2023-01-16 MED ORDER — ATORVASTATIN CALCIUM 40 MG PO TABS: 40.0000 mg | ORAL_TABLET | Freq: Every day | ORAL | 1 refills | Status: DC

## 2023-01-16 NOTE — Assessment & Plan Note (Signed)
B12 injection today 

## 2023-01-16 NOTE — Assessment & Plan Note (Signed)
Continues fosamax/calcium. Last bone density was 2023.

## 2023-01-16 NOTE — Progress Notes (Signed)
Subjective:     Patient ID: Ruth Jensen, female    DOB: [birth year], 72 y.o.   MRN: [id number]  Chief Complaint  Patient presents with   Hypertension    Here for follow up   B12 deficiency    Scheduled for shot 01/18/23    Hypertension   Patient is in today for follow up.  DVT/PE- could not afford xarelto, on asa 81mg  2 tabs once daily.  HTN- maintained on lisinopril 5mg  and toprol xl 50mg .  BP Readings from Last 3 Encounters:  01/16/23 139/74  11/14/22 (!) 164/88  09/17/22 131/63   Osteoporosis- maintained on fosamax weekly, calcium supplement.  Hyperlipidemia- on atorvastatin.  Lab Results  Component Value Date   CHOL 176 05/07/2022   HDL 53.90 05/07/2022   LDLCALC 105 (H) 05/07/2022   TRIG 87.0 05/07/2022   CHOLHDL 3 05/07/2022       Health Maintenance Due  Topic Date Due   DTaP/Tdap/Td (1 - Tdap) Never done   Zoster Vaccines- Shingrix (1 of 2) Never done   COVID-19 Vaccine (6 - 2023-24 season) 08/01/2022   Medicare Annual Wellness (AWV)  03/01/2023    Past Medical History:  Diagnosis Date   Abnormal echocardiogram 07/22/2019   Abnormal EKG 07/22/2019   Acute deep vein thrombosis (DVT) of popliteal vein of right lower extremity (HCC) 07/01/2019   Acute pulmonary embolism (HCC) 07/02/2019   Chest pain 07/22/2019   Depressed left ventricular ejection fraction 07/22/2019   Diastolic dysfunction    DVT (deep venous thrombosis) (HCC) 07/01/2019   History of DVT (deep vein thrombosis) 07/01/2019   History of pulmonary embolism 07/02/2019   Hypertension    Lipid screening 07/22/2019   Obesity (BMI 30-39.9) 07/22/2019   Osteoporosis 11/18/2019   Pulmonary embolism (HCC) 07/02/2019   Single subsegmental pulmonary embolism without acute cor pulmonale (HCC) 07/01/2019    Past Surgical History:  Procedure Laterality Date   ABDOMINAL HYSTERECTOMY  2010   due to fibroids   BREAST BIOPSY Left    BREAST CYST EXCISION Left    COLONOSCOPY     [facility]. Late 2000's    LEFT HEART CATH AND CORONARY ANGIOGRAPHY N/A 08/03/2019   Procedure: LEFT HEART CATH AND CORONARY ANGIOGRAPHY;  Surgeon: Runell Gess, MD;  Location: [facility];  Service: Cardiovascular;  Laterality: N/A;   mitral valve regurg  2023   moderat per 2D echo   OOPHORECTOMY      Family History  Problem Relation Age of Onset   Hypertension Mother    CVA Mother        hemiparesis   Hypertension Father    Dementia Father    Prostate cancer Father    Hypertension Sister    CAD Sister        scheduled for pacemaker   Hypertension Sister    Skin cancer Sister        melanoma died at age 66   Hypertension Sister        ? PFO repair   Parkinson's disease Sister    Asthma Paternal Grandfather    Hypertension Daughter    Hypertension Son        pacemaker   Colon cancer Neg Hx    Esophageal cancer Neg Hx     Social History   Socioeconomic History   Marital status: Married    Spouse name: Nedra Hai   Number of children: 2   Years of education: Not on file  Highest education level: Not on file  Occupational History   Occupation: retired   Tobacco Use   Smoking status: Never   Smokeless tobacco: Never  Vaping Use   Vaping Use: Never used  Substance and Sexual Activity   Alcohol use: Not Currently   Drug use: Never   Sexual activity: Not Currently  Other Topics Concern   Not on file  Social History Narrative   Married   2 children (son and daughter) live locally   4 grandchildren   Retired   Used to work in Investment banker, 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   No pets   Enjoys spending time with grandchildren, baseball games, walking   Social Determinants of Health   Financial Resource Strain: Low Risk  (02/22/2021)   Overall Financial Resource Strain (CARDIA)    Difficulty of Paying Living Expenses: Not hard at all  Food Insecurity: No Food Insecurity (02/22/2021)   Hunger Vital Sign    Worried About Running Out of Food in the Last Year: Never true    Ran Out of  Food in the Last Year: Never true  Transportation Needs: No Transportation Needs (02/22/2021)   PRAPARE - Administrator, Civil Service (Medical): No    Lack of Transportation (Non-Medical): No  Physical Activity: Inactive (02/22/2021)   Exercise Vital Sign    Days of Exercise per Week: 0 days    Minutes of Exercise per Session: 0 min  Stress: No Stress Concern Present (02/22/2021)   [employer] of Occupational Health - Occupational Stress Questionnaire    Feeling of Stress : Not at all  Social Connections: Moderately Integrated (02/22/2021)   Social Connection and Isolation Panel [NHANES]    Frequency of Communication with Friends and Family: More than three times a week    Frequency of Social Gatherings with Friends and Family: More than three times a week    Attends Religious Services: More than 4 times per year    Active Member of [employer] or Organizations: No    Attends Banker Meetings: Never    Marital Status: Married  Catering manager Violence: Not At Risk (02/22/2021)   Humiliation, Afraid, Rape, and Kick questionnaire    Fear of Current or Ex-Partner: No    Emotionally Abused: No    Physically Abused: No    Sexually Abused: No    Outpatient Medications Prior to Visit  Medication Sig Dispense Refill   Calcium Carbonate-Vit D-Min (CALTRATE 600+D PLUS MINERALS) 600-800 MG-UNIT TABS Take 1 tablet by mouth twice daily     Cholecalciferol (VITAMIN D3) 10 MCG (400 UNIT) CAPS Take by mouth.     COVID-19 mRNA bivalent vaccine, Pfizer, (PFIZER COVID-19 VAC BIVALENT) injection Inject into the muscle. 0.3 mL 0   Iron, Ferrous Sulfate, 325 (65 Fe) MG TABS Take 325 mg by mouth every other day. 30 tablet    lisinopril (ZESTRIL) 5 MG tablet Take 1 tablet (5 mg total) by mouth daily. 90 tablet 3   alendronate (FOSAMAX) 70 MG tablet TAKE 1 TABLET(70 MG) BY MOUTH EVERY 7 DAYS WITH A FULL GLASS OF WATER AND ON AN EMPTY STOMACH 4 tablet 11   aspirin EC 325 MG tablet  Take 1 tablet (325 mg total) by mouth daily.     atorvastatin (LIPITOR) 40 MG tablet Take 1 tablet (40 mg total) by mouth daily. 90 tablet 1   COVID-19 mRNA vaccine 2023-2024 (COMIRNATY) SUSP injection Inject into the muscle. 0.3 mL 0   metoprolol succinate (TOPROL-XL) 50 MG  24 hr tablet Take 1 tablet (50 mg total) by mouth daily. Take with or immediately following a meal. 90 tablet 1   nitroGLYCERIN (NITROSTAT) 0.4 MG SL tablet Place 1 tablet (0.4 mg total) under the tongue every 5 (five) minutes as needed. 30 tablet 3   No facility-administered medications prior to visit.    No Known Allergies  ROS See HPI     Objective:    Physical Exam Constitutional:      Appearance: She is well-developed.  Cardiovascular:     Rate and Rhythm: Normal rate and regular rhythm.     Heart sounds: Normal heart sounds. No murmur heard. Pulmonary:     Effort: Pulmonary effort is normal. No respiratory distress.     Breath sounds: Normal breath sounds. No wheezing.  Psychiatric:        Behavior: Behavior normal.        Thought Content: Thought content normal.        Judgment: Judgment normal.     BP 139/74 (BP Location: Right Arm, Patient Position: Sitting, Cuff Size: Large)   Pulse (!) 57   Temp 98.1 F (36.7 C) (Oral)   Resp 16   Wt 198 lb (89.8 kg)   SpO2 100%   BMI 37.41 kg/m  Wt Readings from Last 3 Encounters:  01/16/23 198 lb (89.8 kg)  11/14/22 196 lb (88.9 kg)  09/17/22 195 lb (88.5 kg)       Assessment & Plan:   Problem List Items Addressed This Visit       Unprioritized   Osteoporosis    Continues fosamax/calcium. Last bone density was 2023.      Relevant Medications   alendronate (FOSAMAX) 70 MG tablet   Mixed hyperlipidemia    Last ldl just slightly above goal. Continue atorvastatin, obtain lipid panel.       Relevant Medications   aspirin EC 81 MG tablet   metoprolol succinate (TOPROL-XL) 50 MG 24 hr tablet   nitroGLYCERIN (NITROSTAT) 0.4 MG SL tablet    atorvastatin (LIPITOR) 40 MG tablet   Other Relevant Orders   Lipid panel   Iron deficiency anemia due to chronic blood loss - Primary   Relevant Orders   Iron, TIBC and Ferritin Panel   CBC w/Diff   Hypertension   Relevant Medications   aspirin EC 81 MG tablet   metoprolol succinate (TOPROL-XL) 50 MG 24 hr tablet   nitroGLYCERIN (NITROSTAT) 0.4 MG SL tablet   atorvastatin (LIPITOR) 40 MG tablet   Essential hypertension    BP at goal. Continue lisinopril and toprol xl.       Relevant Medications   aspirin EC 81 MG tablet   metoprolol succinate (TOPROL-XL) 50 MG 24 hr tablet   nitroGLYCERIN (NITROSTAT) 0.4 MG SL tablet   atorvastatin (LIPITOR) 40 MG tablet   Other Relevant Orders   Basic Metabolic Panel (BMET)   B12 deficiency    B12 injection today.        I have discontinued Channing L. Rader's COVID-19 mRNA vaccine 2023-2024 and aspirin EC. I am also having her start on aspirin EC. Additionally, I am having her maintain her Vitamin D3, Caltrate 600+D Plus Minerals, Pfizer COVID-19 Vac Bivalent, Iron (Ferrous Sulfate), lisinopril, metoprolol succinate, nitroGLYCERIN, atorvastatin, and alendronate.  Meds ordered this encounter  Medications   aspirin EC 81 MG tablet    Sig: Take 2 tablets (162 mg total) by mouth daily. Swallow whole.    Dispense:  30 tablet  Refill:  12    Order Specific Question:   Supervising Provider    Answer:   Danise Edge A [[id number]]   metoprolol succinate (TOPROL-XL) 50 MG 24 hr tablet    Sig: Take 1 tablet (50 mg total) by mouth daily. Take with or immediately following a meal.    Dispense:  90 tablet    Refill:  1    Order Specific Question:   Supervising Provider    Answer:   Danise Edge A [[id number]]   nitroGLYCERIN (NITROSTAT) 0.4 MG SL tablet    Sig: Place 1 tablet (0.4 mg total) under the tongue every 5 (five) minutes as needed.    Dispense:  30 tablet    Refill:  0    Order Specific Question:   Supervising Provider    Answer:   Danise Edge A [[id number]]   atorvastatin (LIPITOR) 40 MG tablet    Sig: Take 1 tablet (40 mg total) by mouth daily.    Dispense:  90 tablet    Refill:  1    Order Specific Question:   Supervising Provider    Answer:   Danise Edge A [[id number]]   alendronate (FOSAMAX) 70 MG tablet    Sig: TAKE 1 TABLET(70 MG) BY MOUTH EVERY 7 DAYS WITH A FULL GLASS OF WATER AND ON AN EMPTY STOMACH    Dispense:  12 tablet    Refill:  4    Order Specific Question:   Supervising Provider    Answer:   Danise Edge A [[id number]]

## 2023-01-16 NOTE — Assessment & Plan Note (Signed)
Last ldl just slightly above goal. Continue atorvastatin, obtain lipid panel.

## 2023-01-16 NOTE — Assessment & Plan Note (Signed)
BP at goal. Continue lisinopril and toprol xl.

## 2023-01-17 ENCOUNTER — Encounter: Payer: Self-pay | Admitting: Family

## 2023-01-17 ENCOUNTER — Other Ambulatory Visit: Payer: Self-pay | Admitting: Family

## 2023-01-17 LAB — IRON,TIBC AND FERRITIN PANEL
%SAT: 30 % (calc) (ref 16–45)
Ferritin: 44 ng/mL (ref 16–288)
Iron: 90 ug/dL (ref 45–160)
TIBC: 300 mcg/dL (calc) (ref 250–450)

## 2023-01-18 ENCOUNTER — Ambulatory Visit: Payer: Medicare PPO

## 2023-02-14 ENCOUNTER — Ambulatory Visit (INDEPENDENT_AMBULATORY_CARE_PROVIDER_SITE_OTHER): Payer: Medicare PPO | Admitting: *Deleted

## 2023-02-14 DIAGNOSIS — E538 Deficiency of other specified B group vitamins: Secondary | ICD-10-CM

## 2023-02-14 MED ORDER — CYANOCOBALAMIN 1000 MCG/ML IJ SOLN
1000.0000 ug | Freq: Once | INTRAMUSCULAR | Status: AC
Start: 2023-02-14 — End: 2023-02-14
  Administered 2023-02-14: 1000 ug via INTRAMUSCULAR

## 2023-02-14 NOTE — Progress Notes (Signed)
Patient here for monthly b12 injection per physicians order.  Injection given in right deltoid and patient tolerated well.  

## 2023-03-04 ENCOUNTER — Ambulatory Visit (INDEPENDENT_AMBULATORY_CARE_PROVIDER_SITE_OTHER): Payer: Medicare PPO | Admitting: *Deleted

## 2023-03-04 DIAGNOSIS — Z0000 Encounter for general adult medical examination without abnormal findings: Secondary | ICD-10-CM | POA: Diagnosis not present

## 2023-03-04 NOTE — Patient Instructions (Signed)
Ruth Jensen , Thank you for taking time to come for your Medicare Wellness Visit. I appreciate your ongoing commitment to your health goals. Please review the following plan we discussed and let me know if I can assist you in the future.     This is a list of the screening recommended for you and due dates:  Health Maintenance  Topic Date Due   DTaP/Tdap/Td vaccine (1 - Tdap) Never done   Zoster (Shingles) Vaccine (1 of 2) Never done   COVID-19 Vaccine (6 - 2023-24 season) 08/01/2022   Flu Shot  03/28/2023   Medicare Annual Wellness Visit  03/03/2024   Mammogram  05/01/2024   Colon Cancer Screening  01/11/2030   Pneumonia Vaccine  Completed   DEXA scan (bone density measurement)  Completed   Hepatitis C Screening  Completed   HPV Vaccine  Aged Out    Next appointment: Follow up in one year for your annual wellness visit.   Preventive Care 48 Years and Older, Female Preventive care refers to lifestyle choices and visits with your health care provider that can promote health and wellness. What does preventive care include? A yearly physical exam. This is also called an annual well check. Dental exams once or twice a year. Routine eye exams. Ask your health care provider how often you should have your eyes checked. Personal lifestyle choices, including: Daily care of your teeth and gums. Regular physical activity. Eating a healthy diet. Avoiding tobacco and drug use. Limiting alcohol use. Practicing safe sex. Taking low-dose aspirin every day. Taking vitamin and mineral supplements as recommended by your health care provider. What happens during an annual well check? The services and screenings done by your health care provider during your annual well check will depend on your age, overall health, lifestyle risk factors, and family history of disease. Counseling  Your health care provider may ask you questions about your: Alcohol use. Tobacco use. Drug use. Emotional  well-being. Home and relationship well-being. Sexual activity. Eating habits. History of falls. Memory and ability to understand (cognition). Work and work Astronomer. Reproductive health. Screening  You may have the following tests or measurements: Height, weight, and BMI. Blood pressure. Lipid and cholesterol levels. These may be checked every 5 years, or more frequently if you are over 55 years old. Skin check. Lung cancer screening. You may have this screening every year starting at age 70 if you have a 30-pack-year history of smoking and currently smoke or have quit within the past 15 years. Fecal occult blood test (FOBT) of the stool. You may have this test every year starting at age 61. Flexible sigmoidoscopy or colonoscopy. You may have a sigmoidoscopy every 5 years or a colonoscopy every 10 years starting at age 98. Hepatitis C blood test. Hepatitis B blood test. Sexually transmitted disease (STD) testing. Diabetes screening. This is done by checking your blood sugar (glucose) after you have not eaten for a while (fasting). You may have this done every 1-3 years. Bone density scan. This is done to screen for osteoporosis. You may have this done starting at age 46. Mammogram. This may be done every 1-2 years. Talk to your health care provider about how often you should have regular mammograms. Talk with your health care provider about your test results, treatment options, and if necessary, the need for more tests. Vaccines  Your health care provider may recommend certain vaccines, such as: Influenza vaccine. This is recommended every year. Tetanus, diphtheria, and acellular pertussis (Tdap,  Td) vaccine. You may need a Td booster every 10 years. Zoster vaccine. You may need this after age 34. Pneumococcal 13-valent conjugate (PCV13) vaccine. One dose is recommended after age 71. Pneumococcal polysaccharide (PPSV23) vaccine. One dose is recommended after age 56. Talk to your  health care provider about which screenings and vaccines you need and how often you need them. This information is not intended to replace advice given to you by your health care provider. Make sure you discuss any questions you have with your health care provider. Document Released: 09/09/2015 Document Revised: 05/02/2016 Document Reviewed: 06/14/2015 Elsevier Interactive Patient Education  2017 [employer].  Fall Prevention in the Home Falls can cause injuries. They can happen to people of all ages. There are many things you can do to make your home safe and to help prevent falls. What can I do on the outside of my home? Regularly fix the edges of walkways and driveways and fix any cracks. Remove anything that might make you trip as you walk through a door, such as a raised step or threshold. Trim any bushes or trees on the path to your home. Use bright outdoor lighting. Clear any walking paths of anything that might make someone trip, such as rocks or tools. Regularly check to see if handrails are loose or broken. Make sure that both sides of any steps have handrails. Any raised decks and porches should have guardrails on the edges. Have any leaves, snow, or ice cleared regularly. Use sand or salt on walking paths during winter. Clean up any spills in your garage right away. This includes oil or grease spills. What can I do in the bathroom? Use night lights. Install grab bars by the toilet and in the tub and shower. Do not use towel bars as grab bars. Use non-skid mats or decals in the tub or shower. If you need to sit down in the shower, use a plastic, non-slip stool. Keep the floor dry. Clean up any water that spills on the floor as soon as it happens. Remove soap buildup in the tub or shower regularly. Attach bath mats securely with double-sided non-slip rug tape. Do not have throw rugs and other things on the floor that can make you trip. What can I do in the bedroom? Use night  lights. Make sure that you have a light by your bed that is easy to reach. Do not use any sheets or blankets that are too big for your bed. They should not hang down onto the floor. Have a firm chair that has side arms. You can use this for support while you get dressed. Do not have throw rugs and other things on the floor that can make you trip. What can I do in the kitchen? Clean up any spills right away. Avoid walking on wet floors. Keep items that you use a lot in easy-to-reach places. If you need to reach something above you, use a strong step stool that has a grab bar. Keep electrical cords out of the way. Do not use floor polish or wax that makes floors slippery. If you must use wax, use non-skid floor wax. Do not have throw rugs and other things on the floor that can make you trip. What can I do with my stairs? Do not leave any items on the stairs. Make sure that there are handrails on both sides of the stairs and use them. Fix handrails that are broken or loose. Make sure that handrails are as  long as the stairways. Check any carpeting to make sure that it is firmly attached to the stairs. Fix any carpet that is loose or worn. Avoid having throw rugs at the top or bottom of the stairs. If you do have throw rugs, attach them to the floor with carpet tape. Make sure that you have a light switch at the top of the stairs and the bottom of the stairs. If you do not have them, ask someone to add them for you. What else can I do to help prevent falls? Wear shoes that: Do not have high heels. Have rubber bottoms. Are comfortable and fit you well. Are closed at the toe. Do not wear sandals. If you use a stepladder: Make sure that it is fully opened. Do not climb a closed stepladder. Make sure that both sides of the stepladder are locked into place. Ask someone to hold it for you, if possible. Clearly mark and make sure that you can see: Any grab bars or handrails. First and last  steps. Where the edge of each step is. Use tools that help you move around (mobility aids) if they are needed. These include: Canes. Walkers. Scooters. Crutches. Turn on the lights when you go into a dark area. Replace any light bulbs as soon as they burn out. Set up your furniture so you have a clear path. Avoid moving your furniture around. If any of your floors are uneven, fix them. If there are any pets around you, be aware of where they are. Review your medicines with your doctor. Some medicines can make you feel dizzy. This can increase your chance of falling. Ask your doctor what other things that you can do to help prevent falls. This information is not intended to replace advice given to you by your health care provider. Make sure you discuss any questions you have with your health care provider. Document Released: 06/09/2009 Document Revised: 01/19/2016 Document Reviewed: 09/17/2014 Elsevier Interactive Patient Education  2017 [employer].

## 2023-03-04 NOTE — Progress Notes (Signed)
Subjective:   Ruth Jensen is a 72 y.o. female who presents for Medicare Annual (Subsequent) preventive examination.  Visit Complete: Virtual  I connected with  Ruth Jensen on 03/04/23 by a audio enabled telemedicine application and verified that I am speaking with the correct person using two identifiers.  Patient Location: Home  Provider Location: Office/Clinic  I discussed the limitations of evaluation and management by telemedicine. The patient expressed understanding and agreed to proceed.  Review of Systems     Cardiac Risk Factors include: advanced age (>17men, >75 women);obesity (BMI >30kg/m2);dyslipidemia;hypertension     Objective:    Today's Vitals   There is no height or weight on file to calculate BMI.     03/04/2023   10:28 AM 06/26/2022   10:59 AM 02/28/2022   10:24 AM 02/28/2022   10:23 AM 06/26/2021   10:55 AM 02/22/2021   10:25 AM 01/30/2021    9:17 AM  Advanced Directives  Does Patient Have a Medical Advance Directive? No No No No No No Yes  Does patient want to make changes to medical advance directive?       No - Patient declined  Would patient like information on creating a medical advance directive? No - Patient declined No - Patient declined No - Patient declined No - Patient declined No - Patient declined Yes (MAU/Ambulatory/Procedural Areas - Information given)     Current Medications (verified) Outpatient Encounter Medications as of 03/04/2023  Medication Sig   alendronate (FOSAMAX) 70 MG tablet TAKE 1 TABLET(70 MG) BY MOUTH EVERY 7 DAYS WITH A FULL GLASS OF WATER AND ON AN EMPTY STOMACH   aspirin EC 81 MG tablet Take 2 tablets (162 mg total) by mouth daily. Swallow whole.   atorvastatin (LIPITOR) 40 MG tablet Take 1 tablet (40 mg total) by mouth daily.   Calcium Carbonate-Vit D-Min (CALTRATE 600+D PLUS MINERALS) 600-800 MG-UNIT TABS Take 1 tablet by mouth twice daily   Cholecalciferol (VITAMIN D3) 10 MCG (400 UNIT) CAPS Take by mouth.   COVID-19  mRNA bivalent vaccine, Pfizer, (PFIZER COVID-19 VAC BIVALENT) injection Inject into the muscle.   Iron, Ferrous Sulfate, 325 (65 Fe) MG TABS Take 325 mg by mouth every other day.   lisinopril (ZESTRIL) 5 MG tablet Take 1 tablet (5 mg total) by mouth daily.   metoprolol succinate (TOPROL-XL) 50 MG 24 hr tablet Take 1 tablet (50 mg total) by mouth daily. Take with or immediately following a meal.   nitroGLYCERIN (NITROSTAT) 0.4 MG SL tablet Place 1 tablet (0.4 mg total) under the tongue every 5 (five) minutes as needed.   No facility-administered encounter medications on file as of 03/04/2023.    Allergies (verified) Patient has no known allergies.   History: Past Medical History:  Diagnosis Date   Abnormal echocardiogram 07/22/2019   Abnormal EKG 07/22/2019   Acute deep vein thrombosis (DVT) of popliteal vein of right lower extremity (HCC) 07/01/2019   Acute pulmonary embolism (HCC) 07/02/2019   Chest pain 07/22/2019   Depressed left ventricular ejection fraction 07/22/2019   Diastolic dysfunction    DVT (deep venous thrombosis) (HCC) 07/01/2019   History of DVT (deep vein thrombosis) 07/01/2019   History of pulmonary embolism 07/02/2019   Hypertension    Lipid screening 07/22/2019   Obesity (BMI 30-39.9) 07/22/2019   Osteoporosis 11/18/2019   Pulmonary embolism (HCC) 07/02/2019   Single subsegmental pulmonary embolism without acute cor pulmonale (HCC) 07/01/2019   Past Surgical History:  Procedure Laterality Date  ABDOMINAL HYSTERECTOMY  2010   due to fibroids   BREAST BIOPSY Left    BREAST CYST EXCISION Left    COLONOSCOPY     [facility]. Late 2000's    LEFT HEART CATH AND CORONARY ANGIOGRAPHY N/A 08/03/2019   Procedure: LEFT HEART CATH AND CORONARY ANGIOGRAPHY;  Surgeon: Runell Gess, MD;  Location: [facility] INVASIVE CV LAB;  Service: Cardiovascular;  Laterality: N/A;   mitral valve regurg  2023   moderat per 2D echo   OOPHORECTOMY     Family History  Problem  Relation Age of Onset   Hypertension Mother    CVA Mother        hemiparesis   Hypertension Father    Dementia Father    Prostate cancer Father    Hypertension Sister    CAD Sister        scheduled for pacemaker   Hypertension Sister    Skin cancer Sister        melanoma died at age 2   Hypertension Sister        ? PFO repair   Parkinson's disease Sister    Asthma Paternal Grandfather    Hypertension Daughter    Hypertension Son        pacemaker   Colon cancer Neg Hx    Esophageal cancer Neg Hx    Social History   Socioeconomic History   Marital status: Married    Spouse name: Nedra Hai   Number of children: 2   Years of education: Not on file   Highest education level: Not on file  Occupational History   Occupation: retired   Tobacco Use   Smoking status: Never   Smokeless tobacco: Never  Vaping Use   Vaping Use: Never used  Substance and Sexual Activity   Alcohol use: Not Currently   Drug use: Never   Sexual activity: Not Currently  Other Topics Concern   Not on file  Social History Narrative   Married   2 children (son and daughter) live locally   4 grandchildren   Retired   Used to work in Investment banker, 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   No pets   Enjoys spending time with grandchildren, baseball games, walking   Social Determinants of Health   Financial Resource Strain: Low Risk  (02/22/2021)   Overall Financial Resource Strain (CARDIA)    Difficulty of Paying Living Expenses: Not hard at all  Food Insecurity: No Food Insecurity (03/04/2023)   Hunger Vital Sign    Worried About Running Out of Food in the Last Year: Never true    Ran Out of Food in the Last Year: Never true  Transportation Needs: No Transportation Needs (03/04/2023)   PRAPARE - Administrator, Civil Service (Medical): No    Lack of Transportation (Non-Medical): No  Physical Activity: Inactive (03/04/2023)   Exercise Vital Sign    Days of Exercise per Week: 0 days    Minutes of Exercise per  Session: 0 min  Stress: No Stress Concern Present (02/22/2021)   [employer] of Occupational Health - Occupational Stress Questionnaire    Feeling of Stress : Not at all  Social Connections: Moderately Integrated (02/22/2021)   Social Connection and Isolation Panel [NHANES]    Frequency of Communication with Friends and Family: More than three times a week    Frequency of Social Gatherings with Friends and Family: More than three times a week    Attends Religious Services: More than 4 times per year  Active Member of Clubs or Organizations: No    Attends Banker Meetings: Never    Marital Status: Married    Tobacco Counseling Counseling given: Not Answered   Clinical Intake:  Pre-visit preparation completed: Yes  Pain : No/denies pain  Nutritional Risks: None Diabetes: No  How often do you need to have someone help you when you read instructions, pamphlets, or other written materials from your doctor or pharmacy?: 1 - Never  Interpreter Needed?: No  Information entered by :: Donne Anon, CMA   Activities of Daily Living    03/04/2023   10:22 AM  In your present state of health, do you have any difficulty performing the following activities:  Hearing? 0  Vision? 0  Difficulty concentrating or making decisions? 0  Walking or climbing stairs? 0  Dressing or bathing? 0  Doing errands, shopping? 0  Preparing Food and eating ? N  Using the Toilet? N  In the past six months, have you accidently leaked urine? N  Do you have problems with loss of bowel control? N  Managing your Medications? N  Managing your Finances? N  Housekeeping or managing your Housekeeping? N    Patient Care Team: Sandford Craze, NP as PCP - General (Internal Medicine) Thomasene Ripple, DO as PCP - Cardiology (Cardiology)  Indicate any recent Medical Services you may have received from other than [facility] providers in the past year (date may be approximate).     Assessment:    This is a routine wellness examination for Katarzyna.  Hearing/Vision screen No results found.  Dietary issues and exercise activities discussed:     Goals Addressed   None    Depression Screen    03/04/2023   10:27 AM 01/16/2023   10:14 AM 02/28/2022   10:24 AM 04/14/2021    1:57 PM 02/22/2021   10:29 AM 10/15/2019    2:03 PM  PHQ 2/9 Scores  PHQ - 2 Score 0 0 0 0 0 0  PHQ- 9 Score  0        Fall Risk    03/04/2023   10:25 AM 01/16/2023   10:14 AM 02/28/2022   10:24 AM 04/14/2021    1:57 PM 02/22/2021   10:28 AM  Fall Risk   Falls in the past year? 0 0 0 0 0  Number falls in past yr: 0 0 0 0 0  Injury with Fall? 0 0 0 0 0  Risk for fall due to : No Fall Risks No Fall Risks No Fall Risks    Follow up Falls evaluation completed Falls evaluation completed Falls evaluation completed  Falls prevention discussed    MEDICARE RISK AT HOME:  Medicare Risk at Home - 03/04/23 1023     Any stairs in or around the home? Yes    If so, are there any without handrails? No    Home free of loose throw rugs in walkways, pet beds, electrical cords, etc? Yes    Adequate lighting in your home to reduce risk of falls? Yes    Life alert? No    Use of a cane, walker or w/c? No    Grab bars in the bathroom? Yes   in the tub   Shower chair or bench in shower? No    Elevated toilet seat or a handicapped toilet? Yes             TIMED UP AND GO:  Was the test performed?  No  Cognitive Function:        03/04/2023   10:28 AM 02/28/2022   10:30 AM  6CIT Screen  What Year? 0 points 0 points  What month? 0 points 0 points  What time? 0 points 0 points  Count back from 20 0 points 0 points  Months in reverse 0 points 0 points  Repeat phrase 0 points 0 points  Total Score 0 points 0 points    Immunizations Immunization History  Administered Date(s) Administered   COVID-19, mRNA, vaccine(Comirnaty)12 years and older 06/06/2022   Fluad Quad(high Dose 65+) 07/14/2019, 05/27/2020, 10/20/2021,  05/07/2022   PFIZER(Purple Top)SARS-COV-2 Vaccination 11/07/2019, 11/28/2019, 06/17/2020   PNEUMOCOCCAL CONJUGATE-20 10/20/2021   Pfizer Covid-19 Vaccine Bivalent Booster 14yrs & up 10/25/2021   Pneumococcal Polysaccharide-23 10/04/2020    TDAP status: Due, Education has been provided regarding the importance of this vaccine. Advised may receive this vaccine at local pharmacy or Health Dept. Aware to provide a copy of the vaccination record if obtained from local pharmacy or Health Dept. Verbalized acceptance and understanding.  Flu Vaccine status: Up to date  Pneumococcal vaccine status: Up to date  Covid-19 vaccine status: Information provided on how to obtain vaccines.   Qualifies for Shingles Vaccine? Yes   Zostavax completed No   Shingrix Completed?: No.    Education has been provided regarding the importance of this vaccine. Patient has been advised to call insurance company to determine out of pocket expense if they have not yet received this vaccine. Advised may also receive vaccine at local pharmacy or Health Dept. Verbalized acceptance and understanding.  Screening Tests Health Maintenance  Topic Date Due   DTaP/Tdap/Td (1 - Tdap) Never done   Zoster Vaccines- Shingrix (1 of 2) Never done   COVID-19 Vaccine (6 - 2023-24 season) 08/01/2022   Medicare Annual Wellness (AWV)  03/01/2023   INFLUENZA VACCINE  03/28/2023   MAMMOGRAM  05/01/2024   Colonoscopy  01/11/2030   Pneumonia Vaccine 80+ Years old  Completed   DEXA SCAN  Completed   Hepatitis C Screening  Completed   HPV VACCINES  Aged Out    Health Maintenance  Health Maintenance Due  Topic Date Due   DTaP/Tdap/Td (1 - Tdap) Never done   Zoster Vaccines- Shingrix (1 of 2) Never done   COVID-19 Vaccine (6 - 2023-24 season) 08/01/2022   Medicare Annual Wellness (AWV)  03/01/2023    Colorectal cancer screening: Type of screening: Colonoscopy. Completed 01/12/20. Repeat every 10 years  Mammogram status: Completed  05/01/22. Repeat every year  Bone Density status: Completed 05/01/22. Results reflect: Bone density results: OSTEOPENIA. Repeat every 2 years.  Lung Cancer Screening: (Low Dose CT Chest recommended if Age 67-80 years, 20 pack-year currently smoking OR have quit w/in 15years.) does not qualify.   Additional Screening:  Hepatitis C Screening: does qualify; Completed 04/14/21  Vision Screening: Recommended annual ophthalmology exams for early detection of glaucoma and other disorders of the eye. Is the patient up to date with their annual eye exam?  No  Who is the provider or what is the name of the office in which the patient attends annual eye exams? Doesn't have a doctor at this time If pt is not established with a provider, would they like to be referred to a provider to establish care? No .   Dental Screening: Recommended annual dental exams for proper oral hygiene  Diabetic Foot Exam: N/a  Community Resource Referral / Chronic Care Management: CRR required this visit?  No  CCM required this visit?  No     Plan:     I have personally reviewed and noted the following in the patient's chart:   Medical and social history Use of alcohol, tobacco or illicit drugs  Current medications and supplements including opioid prescriptions. Patient is not currently taking opioid prescriptions. Functional ability and status Nutritional status Physical activity Advanced directives List of other physicians Hospitalizations, surgeries, and ER visits in previous 12 months Vitals Screenings to include cognitive, depression, and falls Referrals and appointments  In addition, I have reviewed and discussed with patient certain preventive protocols, quality metrics, and best practice recommendations. A written personalized care plan for preventive services as well as general preventive health recommendations were provided to patient.     Donne Anon, CMA   03/04/2023   After Visit Summary:  (Declined) Due to this being a telephonic visit, with patients personalized plan was offered to patient but patient Declined AVS at this time   Nurse Notes: None

## 2023-03-14 ENCOUNTER — Ambulatory Visit: Payer: Medicare PPO

## 2023-03-14 DIAGNOSIS — E538 Deficiency of other specified B group vitamins: Secondary | ICD-10-CM | POA: Diagnosis not present

## 2023-03-14 MED ORDER — CYANOCOBALAMIN 1000 MCG/ML IJ SOLN
1000.0000 ug | Freq: Once | INTRAMUSCULAR | Status: AC
Start: 2023-03-14 — End: 2023-03-14
  Administered 2023-03-14: 1000 ug via INTRAMUSCULAR

## 2023-03-14 NOTE — Progress Notes (Signed)
Ruth Jensen is a 72 y.o. female presents to the office today for Monthly B12 injections, per physician's orders. Original order: per 09/17/22 to continue once a month B12 injections. (Last OV: 01/16/23) Cyanocobalamin, 1000 mg/ml,  im was administered L deltoid today. Patient tolerated injection. Patient due for follow up labs/provider appt: no Patient next injection due: 1 month, appt made yes   DOD: Dr Almyra Brace, Feliberto Harts

## 2023-03-19 ENCOUNTER — Ambulatory Visit: Payer: Medicare PPO

## 2023-03-25 ENCOUNTER — Other Ambulatory Visit (HOSPITAL_BASED_OUTPATIENT_CLINIC_OR_DEPARTMENT_OTHER): Payer: Self-pay | Admitting: Family

## 2023-03-25 DIAGNOSIS — Z1231 Encounter for screening mammogram for malignant neoplasm of breast: Secondary | ICD-10-CM

## 2023-04-16 ENCOUNTER — Ambulatory Visit (INDEPENDENT_AMBULATORY_CARE_PROVIDER_SITE_OTHER): Payer: Medicare PPO

## 2023-04-16 DIAGNOSIS — E538 Deficiency of other specified B group vitamins: Secondary | ICD-10-CM

## 2023-04-16 MED ORDER — CYANOCOBALAMIN 1000 MCG/ML IJ SOLN
1000.0000 ug | Freq: Once | INTRAMUSCULAR | Status: AC
Start: 2023-04-16 — End: 2023-04-16
  Administered 2023-04-16: 1000 ug via INTRAMUSCULAR

## 2023-04-16 NOTE — Progress Notes (Signed)
Ruth Jensen is a 72 y.o. female presents to the office today for Monthly B12 injections, per physician's orders. Original order: per 09/17/22 to continue once a month B12 injections.  Cyanocobalamin, 1000 mg/ml, im was administered R deltoid today. Patient tolerated injection. Patient due for follow up labs/provider appt: no

## 2023-05-06 ENCOUNTER — Encounter (HOSPITAL_BASED_OUTPATIENT_CLINIC_OR_DEPARTMENT_OTHER): Payer: Self-pay

## 2023-05-06 ENCOUNTER — Ambulatory Visit (HOSPITAL_BASED_OUTPATIENT_CLINIC_OR_DEPARTMENT_OTHER)
Admission: RE | Admit: 2023-05-06 | Discharge: 2023-05-06 | Disposition: A | Discharge status: Home / Self Care | Payer: Medicare PPO | Source: Ambulatory Visit | Attending: Family | Admitting: Family

## 2023-05-06 DIAGNOSIS — Z1231 Encounter for screening mammogram for malignant neoplasm of breast: Secondary | ICD-10-CM | POA: Insufficient documentation

## 2023-05-16 NOTE — Progress Notes (Signed)
Ruth Jensen is a 72 y.o. female presents to the office today for Monthly B12 injections, per physician's orders. Original order: per 09/17/22 to continue once a month B12 injections. (Last OV: 01/16/23) Cyanocobalamin, 1000 mg/ml,  im was administered L Deltoid today. Patient tolerated injection. Patient due for follow up labs/provider appt: Apt pending in November Patient next injection due: 1 month, appt made yes   Creft, Melton Alar L

## 2023-05-17 ENCOUNTER — Ambulatory Visit (INDEPENDENT_AMBULATORY_CARE_PROVIDER_SITE_OTHER): Payer: Medicare PPO

## 2023-05-17 DIAGNOSIS — E538 Deficiency of other specified B group vitamins: Secondary | ICD-10-CM | POA: Diagnosis not present

## 2023-05-17 MED ORDER — CYANOCOBALAMIN 1000 MCG/ML IJ SOLN
1000.0000 ug | Freq: Once | INTRAMUSCULAR | Status: AC
Start: 2023-05-17 — End: 2023-05-17
  Administered 2023-05-17: 1000 ug via INTRAMUSCULAR

## 2023-06-19 ENCOUNTER — Ambulatory Visit: Payer: Medicare PPO

## 2023-06-19 DIAGNOSIS — E538 Deficiency of other specified B group vitamins: Secondary | ICD-10-CM

## 2023-06-19 MED ORDER — CYANOCOBALAMIN 1000 MCG/ML IJ SOLN
1000.0000 ug | Freq: Once | INTRAMUSCULAR | Status: AC
Start: 2023-06-19 — End: 2023-06-19
  Administered 2023-06-19: 1000 ug via INTRAMUSCULAR

## 2023-06-19 NOTE — Progress Notes (Signed)
Ruth Jensen is a 72 y.o. female presents to the office today for Monthly B12 injections, per physician's orders. Original order: per 09/17/22 to continue once a month B12 injections. (Last OV: 01/16/23) Cyanocobalamin, 1000 mg/ml, im was administered L Deltoid today. Patient tolerated injection. Patient due for follow up labs/provider appt: Apt pending in November

## 2023-07-17 ENCOUNTER — Ambulatory Visit (INDEPENDENT_AMBULATORY_CARE_PROVIDER_SITE_OTHER): Payer: Medicare PPO

## 2023-07-17 DIAGNOSIS — E538 Deficiency of other specified B group vitamins: Secondary | ICD-10-CM | POA: Diagnosis not present

## 2023-07-17 MED ORDER — CYANOCOBALAMIN 1000 MCG/ML IJ SOLN
1000.0000 ug | Freq: Once | INTRAMUSCULAR | Status: AC
Start: 2023-07-17 — End: 2023-07-17
  Administered 2023-07-17: 1000 ug via INTRAMUSCULAR

## 2023-07-17 NOTE — Progress Notes (Signed)
Pt here for monthly B12 injection per Melissa  B12 given IM, and pt tolerated injection well.  Next B12 injection scheduled for 10/20

## 2023-07-22 ENCOUNTER — Other Ambulatory Visit (HOSPITAL_BASED_OUTPATIENT_CLINIC_OR_DEPARTMENT_OTHER): Payer: Self-pay

## 2023-07-22 ENCOUNTER — Ambulatory Visit (INDEPENDENT_AMBULATORY_CARE_PROVIDER_SITE_OTHER): Payer: Medicare PPO | Admitting: Family

## 2023-07-22 VITALS — BP 137/62 | HR 81 | Temp 98.6°F | Resp 16 | Ht 61.0 in | Wt 190.0 lb

## 2023-07-22 DIAGNOSIS — I10 Essential (primary) hypertension: Secondary | ICD-10-CM

## 2023-07-22 DIAGNOSIS — D500 Iron deficiency anemia secondary to blood loss (chronic): Secondary | ICD-10-CM | POA: Diagnosis not present

## 2023-07-22 DIAGNOSIS — E782 Mixed hyperlipidemia: Secondary | ICD-10-CM

## 2023-07-22 DIAGNOSIS — E538 Deficiency of other specified B group vitamins: Secondary | ICD-10-CM

## 2023-07-22 DIAGNOSIS — Z23 Encounter for immunization: Secondary | ICD-10-CM

## 2023-07-22 DIAGNOSIS — M19072 Primary osteoarthritis, left ankle and foot: Secondary | ICD-10-CM | POA: Diagnosis not present

## 2023-07-22 DIAGNOSIS — M810 Age-related osteoporosis without current pathological fracture: Secondary | ICD-10-CM

## 2023-07-22 DIAGNOSIS — K219 Gastro-esophageal reflux disease without esophagitis: Secondary | ICD-10-CM | POA: Diagnosis not present

## 2023-07-22 DIAGNOSIS — Z8673 Personal history of transient ischemic attack (TIA), and cerebral infarction without residual deficits: Secondary | ICD-10-CM | POA: Diagnosis not present

## 2023-07-22 LAB — CBC WITH DIFFERENTIAL/PLATELET
Basophils Absolute: 0 10*3/uL (ref 0.0–0.1)
Basophils Relative: 0.3 % (ref 0.0–3.0)
Eosinophils Absolute: 0.2 10*3/uL (ref 0.0–0.7)
Eosinophils Relative: 3.2 % (ref 0.0–5.0)
HCT: 35.8 % — ABNORMAL LOW (ref 36.0–46.0)
Hemoglobin: 11.2 g/dL — ABNORMAL LOW (ref 12.0–15.0)
Lymphocytes Relative: 32.8 % (ref 12.0–46.0)
Lymphs Abs: 2 10*3/uL (ref 0.7–4.0)
MCHC: 31.4 g/dL (ref 30.0–36.0)
MCV: 79.4 fL (ref 78.0–100.0)
Monocytes Absolute: 0.7 10*3/uL (ref 0.1–1.0)
Monocytes Relative: 11.3 % (ref 3.0–12.0)
Neutro Abs: 3.2 10*3/uL (ref 1.4–7.7)
Neutrophils Relative %: 52.4 % (ref 43.0–77.0)
Platelets: 284 10*3/uL (ref 150.0–400.0)
RBC: 4.5 Mil/uL (ref 3.87–5.11)
RDW: 14 % (ref 11.5–15.5)
WBC: 6.1 10*3/uL (ref 4.0–10.5)

## 2023-07-22 LAB — VITAMIN B12: Vitamin B-12: 1170 pg/mL — ABNORMAL HIGH (ref 211–911)

## 2023-07-22 MED ORDER — COMIRNATY 30 MCG/0.3ML IM SUSY
0.3000 mL | PREFILLED_SYRINGE | Freq: Once | INTRAMUSCULAR | 0 refills | Status: AC
  Filled 2023-07-22: qty 0.3, 1d supply, fill #0

## 2023-07-22 MED ORDER — OMEPRAZOLE 40 MG PO CPDR
40.0000 mg | DELAYED_RELEASE_CAPSULE | Freq: Every day | ORAL | 3 refills | Status: DC
Start: 2023-07-22 — End: 2024-01-09

## 2023-07-22 MED ORDER — TETANUS-DIPHTHERIA TOXOIDS TD 2-2 LF/0.5ML IM SUSP: 0.5000 mL | Freq: Once | INTRAMUSCULAR | 0 refills | Status: AC

## 2023-07-22 NOTE — Assessment & Plan Note (Signed)
Stable, continues monthly b12 shots.

## 2023-07-22 NOTE — Assessment & Plan Note (Signed)
Lab Results  Component Value Date   CHOL 160 01/16/2023   HDL 48.70 01/16/2023   LDLCALC 93 01/16/2023   TRIG 89.0 01/16/2023   CHOLHDL 3 01/16/2023   At goal on atorvastatin, continue same.

## 2023-07-22 NOTE — Progress Notes (Signed)
Subjective:     Patient ID: Ruth Jensen, female    DOB: 28-Sep-1950, 72 y.o.   MRN: [id number]  Chief Complaint  Patient presents with   Hypertension    Here for follow up   Hyperlipidemia    Here for follow up   B12 deficiency    On B12 shots    HPI  Discussed the use of AI scribe software for clinical note transcription with the patient, who gave verbal consent to proceed.  History of Present Illness   The patient, with a history of mild anemia, hyperlipidemia, and osteoporosis, presents with a new complaint of leg weakness. She describes difficulty stepping up without support, particularly on her right leg. The weakness is localized to the foot and does not cause pain. The patient's husband has installed a handrail at home to assist her. She is currently on Fosamax and calcium for osteoporosis, and atorvastatin for hyperlipidemia. She also takes over-the-counter iron supplements every other day, which occasionally cause constipation that she manages with stool softeners.  In addition to the leg weakness, the patient reports a nocturnal cough that often results in a sore throat in the morning. She does not experience this cough during the day. She denies eating before bedtime and tries to prop herself up with two pillows when sleeping.         Health Maintenance Due  Topic Date Due   DTaP/Tdap/Td (1 - Tdap) Never done   Zoster Vaccines- Shingrix (1 of 2) Never done   INFLUENZA VACCINE  03/28/2023   COVID-19 Vaccine (6 - 2023-24 season) 04/28/2023    Past Medical History:  Diagnosis Date   Abnormal echocardiogram 07/22/2019   Abnormal EKG 07/22/2019   Acute deep vein thrombosis (DVT) of popliteal vein of right lower extremity (HCC) 07/01/2019   Acute pulmonary embolism (HCC) 07/02/2019   Chest pain 07/22/2019   Depressed left ventricular ejection fraction 07/22/2019   Diastolic dysfunction    DVT (deep venous thrombosis) (HCC) 07/01/2019   History of DVT (deep vein  thrombosis) 07/01/2019   History of pulmonary embolism 07/02/2019   Hypertension    Lipid screening 07/22/2019   Obesity (BMI 30-39.9) 07/22/2019   Osteoporosis 11/18/2019   Pulmonary embolism (HCC) 07/02/2019   Single subsegmental pulmonary embolism without acute cor pulmonale (HCC) 07/01/2019    Past Surgical History:  Procedure Laterality Date   ABDOMINAL HYSTERECTOMY  2010   due to fibroids   BREAST BIOPSY Left    BREAST CYST EXCISION Left    COLONOSCOPY     [facility]. Late 2000's    LEFT HEART CATH AND CORONARY ANGIOGRAPHY N/A 08/03/2019   Procedure: LEFT HEART CATH AND CORONARY ANGIOGRAPHY;  Surgeon: Runell Gess, MD;  Location: [facility] INVASIVE [facility];  Service: Cardiovascular;  Laterality: N/A;   mitral valve regurg  2023   moderat per 2D echo   OOPHORECTOMY      Family History  Problem Relation Age of Onset   Hypertension Mother    CVA Mother        hemiparesis   Hypertension Father    Dementia Father    Prostate cancer Father    Hypertension Sister    CAD Sister        scheduled for pacemaker   Hypertension Sister    Skin cancer Sister        melanoma died at age 44   Hypertension Sister        ? PFO repair  Parkinson's disease Sister    Asthma Paternal Grandfather    Hypertension Daughter    Hypertension Son        pacemaker   Colon cancer Neg Hx    Esophageal cancer Neg Hx     Social History   Socioeconomic History   Marital status: Married    Spouse name: Nedra Hai   Number of children: 2   Years of education: Not on file   Highest education level: Not on file  Occupational History   Occupation: retired   Tobacco Use   Smoking status: Never   Smokeless tobacco: Never  Vaping Use   Vaping status: Never Used  Substance and Sexual Activity   Alcohol use: Not Currently   Drug use: Never   Sexual activity: Not Currently  Other Topics Concern   Not on file  Social History Narrative   Married   2 children (son and daughter) live locally    4 grandchildren   Retired   Used to work in Investment banker, 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   No pets   Enjoys spending time with grandchildren, baseball games, walking   Social Determinants of Health   Financial Resource Strain: Low Risk  (02/22/2021)   Overall Financial Resource Strain (CARDIA)    Difficulty of Paying Living Expenses: Not hard at all  Food Insecurity: No Food Insecurity (03/04/2023)   Hunger Vital Sign    Worried About Running Out of Food in the Last Year: Never true    Ran Out of Food in the Last Year: Never true  Transportation Needs: No Transportation Needs (03/04/2023)   PRAPARE - Administrator, Civil Service (Medical): No    Lack of Transportation (Non-Medical): No  Physical Activity: Inactive (03/04/2023)   Exercise Vital Sign    Days of Exercise per Week: 0 days    Minutes of Exercise per Session: 0 min  Stress: No Stress Concern Present (02/22/2021)   [employer] of Occupational Health - Occupational Stress Questionnaire    Feeling of Stress : Not at all  Social Connections: Moderately Integrated (02/22/2021)   Social Connection and Isolation Panel [NHANES]    Frequency of Communication with Friends and Family: More than three times a week    Frequency of Social Gatherings with Friends and Family: More than three times a week    Attends Religious Services: More than 4 times per year    Active Member of [employer] or Organizations: No    Attends Banker Meetings: Never    Marital Status: Married  Catering manager Violence: Not At Risk (03/04/2023)   Humiliation, Afraid, Rape, and Kick questionnaire    Fear of Current or Ex-Partner: No    Emotionally Abused: No    Physically Abused: No    Sexually Abused: No    Outpatient Medications Prior to Visit  Medication Sig Dispense Refill   alendronate (FOSAMAX) 70 MG tablet TAKE 1 TABLET(70 MG) BY MOUTH EVERY 7 DAYS WITH A FULL GLASS OF WATER AND ON AN EMPTY STOMACH 12 tablet 4   aspirin EC 81 MG tablet  Take 2 tablets (162 mg total) by mouth daily. Swallow whole. 30 tablet 12   atorvastatin (LIPITOR) 40 MG tablet Take 1 tablet (40 mg total) by mouth daily. 90 tablet 1   Calcium Carbonate-Vit D-Min (CALTRATE 600+D PLUS MINERALS) 600-800 MG-UNIT TABS Take 1 tablet by mouth twice daily     Cholecalciferol (VITAMIN D3) 10 MCG (400 UNIT) CAPS Take by mouth.  Iron, Ferrous Sulfate, 325 (65 Fe) MG TABS Take 325 mg by mouth every other day. 30 tablet    lisinopril (ZESTRIL) 5 MG tablet Take 1 tablet (5 mg total) by mouth daily. 90 tablet 3   metoprolol succinate (TOPROL-XL) 50 MG 24 hr tablet Take 1 tablet (50 mg total) by mouth daily. Take with or immediately following a meal. 90 tablet 1   nitroGLYCERIN (NITROSTAT) 0.4 MG SL tablet Place 1 tablet (0.4 mg total) under the tongue every 5 (five) minutes as needed. 30 tablet 0   COVID-19 mRNA bivalent vaccine, Pfizer, (PFIZER COVID-19 VAC BIVALENT) injection Inject into the muscle. 0.3 mL 0   No facility-administered medications prior to visit.    No Known Allergies  ROS See HPI    Objective:    Physical Exam Constitutional:      General: She is not in acute distress.    Appearance: Normal appearance. She is well-developed.  HENT:     Head: Normocephalic and atraumatic.     Right Ear: External ear normal.     Left Ear: External ear normal.  Eyes:     General: No scleral icterus. Neck:     Thyroid: No thyromegaly.  Cardiovascular:     Rate and Rhythm: Normal rate and regular rhythm.     Heart sounds: Normal heart sounds. No murmur heard. Pulmonary:     Effort: Pulmonary effort is normal. No respiratory distress.     Breath sounds: Normal breath sounds. No wheezing.  Musculoskeletal:     Cervical back: Neck supple.     Comments: Left ankle with some tenderness to palpation  Skin:    General: Skin is warm and dry.  Neurological:     Mental Status: She is alert and oriented to person, place, and time.     Deep Tendon Reflexes:      Reflex Scores:      Patellar reflexes are 2+ on the right side and 2+ on the left side.    Comments: Bilateral LE strength is 5/5  Psychiatric:        Mood and Affect: Mood normal.        Behavior: Behavior normal.        Thought Content: Thought content normal.        Judgment: Judgment normal.      BP 137/62 (BP Location: Right Arm, Patient Position: Sitting, Cuff Size: Normal)   Pulse 81   Temp 98.6 F (37 C) (Oral)   Resp 16   Ht 5\' 1"  (1.549 m)   Wt 190 lb (86.2 kg)   SpO2 100%   BMI 35.90 kg/m  Wt Readings from Last 3 Encounters:  07/22/23 190 lb (86.2 kg)  01/16/23 198 lb (89.8 kg)  11/14/22 196 lb (88.9 kg)       Assessment & Plan:   Problem List Items Addressed This Visit       Unprioritized   Osteoporosis - Primary    Stable, continue fosamax and calcium, plan to update dexa in 1 year.      Osteoarthritis of left ankle    New.  Reassurance provided.  Monitor.       Mixed hyperlipidemia    Lab Results  Component Value Date   CHOL 160 01/16/2023   HDL 48.70 01/16/2023   LDLCALC 93 01/16/2023   TRIG 89.0 01/16/2023   CHOLHDL 3 01/16/2023   At goal on atorvastatin, continue same.       Iron deficiency anemia due to  chronic blood loss    Lab Results  Component Value Date   WBC 6.9 01/16/2023   HGB 11.6 (L) 01/16/2023   HCT 37.3 01/16/2023   MCV 83.2 01/16/2023   PLT 280.0 01/16/2023   Mild anemia.  Update today.       Relevant Orders   CBC w/Diff   Hypertension    BP Readings from Last 3 Encounters:  07/22/23 137/62  01/16/23 139/74  11/14/22 (!) 164/88   Stable on lisinopril and toprol xl.       History of CVA (cerebrovascular accident)    On asprin 81 mg and statin for secondary stroke prevention.       GERD (gastroesophageal reflux disease)   Relevant Medications   omeprazole (PRILOSEC) 40 MG capsule   B12 deficiency    Stable, continues monthly b12 shots.       Relevant Orders   B12   Flu shot today. Td sent to  pharmacy. Could not afford shingrix at pharmacy.  I have discontinued Brandalynn L. Scearce's Pfizer COVID-19 Vac Bivalent. I am also having her start on omeprazole and diptheria-tetanus toxoids. Additionally, I am having her maintain her Vitamin D3, Caltrate 600+D Plus Minerals, Iron (Ferrous Sulfate), lisinopril, aspirin EC, metoprolol succinate, nitroGLYCERIN, atorvastatin, and alendronate.  Meds ordered this encounter  Medications   omeprazole (PRILOSEC) 40 MG capsule    Sig: Take 1 capsule (40 mg total) by mouth daily.    Dispense:  30 capsule    Refill:  3    Order Specific Question:   Supervising Provider    Answer:   Danise Edge A [[id number]]   diptheria-tetanus toxoids (DECAVAC) 2-2 LF/0.5ML injection    Sig: Inject 0.5 mLs into the muscle once for 1 dose.    Dispense:  0.5 mL    Refill:  0    Order Specific Question:   Supervising Provider    Answer:   Danise Edge A [[id number]]

## 2023-07-22 NOTE — Assessment & Plan Note (Signed)
BP Readings from Last 3 Encounters:  07/22/23 137/62  01/16/23 139/74  11/14/22 (!) 164/88   Stable on lisinopril and toprol xl.

## 2023-07-22 NOTE — Assessment & Plan Note (Signed)
>>  ASSESSMENT AND PLAN FOR HYPERTENSION WRITTEN ON 07/22/2023 10:20 AM BY O'SULLIVAN, Shadoe Cryan, NP  BP Readings from Last 3 Encounters:  07/22/23 137/62  01/16/23 139/74  11/14/22 (!) 164/88   Stable on lisinopril  and toprol  xl.

## 2023-07-22 NOTE — Assessment & Plan Note (Signed)
Lab Results  Component Value Date   WBC 6.9 01/16/2023   HGB 11.6 (L) 01/16/2023   HCT 37.3 01/16/2023   MCV 83.2 01/16/2023   PLT 280.0 01/16/2023   Mild anemia.  Update today.

## 2023-07-22 NOTE — Assessment & Plan Note (Signed)
New.  Reassurance provided.  Monitor.

## 2023-07-22 NOTE — Patient Instructions (Signed)
VISIT SUMMARY:  During today's visit, we addressed your new complaint of leg weakness, as well as your ongoing issues with nocturnal cough, hyperlipidemia, mild anemia, and vitamin B12 deficiency. We also reviewed your general health maintenance and administered the influenza vaccine.  YOUR PLAN:  -Left foot problem: You are experiencing difficulty stepping up on your left foot, which is likely due to arthritis. No changes to your current management are needed at this time.  -GASTROESOPHAGEAL REFLUX DISEASE (GERD): GERD is a condition where stomach acid frequently flows back into the tube connecting your mouth and stomach, causing symptoms like a nocturnal cough and morning sore throat. We are starting you on Omeprazole once daily and recommend avoiding eating 90 minutes before bedtime and considering elevating the head of your bed.  -HYPERLIPIDEMIA: Hyperlipidemia is a condition with high levels of fats (lipids) in your blood. Your condition is well controlled with Atorvastatin, so you should continue taking it as prescribed.  -MILD ANEMIA: Anemia is a condition where you lack enough healthy red blood cells to carry adequate oxygen to your body's tissues. We will check your complete blood count and iron levels today to monitor your condition.  -VITAMIN B12 DEFICIENCY: Vitamin B12 deficiency occurs when your body does not have enough of this vitamin, which is essential for nerve function and the production of red blood cells. We will check your B12 level today to ensure your monthly injections are effective.  -GENERAL HEALTH MAINTENANCE: We administered your influenza vaccine today. We discussed the tetanus and shingles vaccines, but you declined them due to cost. Please follow up with cardiology in March and return to the clinic in 6 months for a routine check-up.  INSTRUCTIONS:  Please follow up with cardiology in March and return to the clinic in 6 months for a routine check-up. We will also  check your complete blood count, iron levels, and B12 level today.

## 2023-07-22 NOTE — Addendum Note (Signed)
Addended by: Wilford Corner on: 07/22/2023 12:48 PM   Modules accepted: Orders

## 2023-07-22 NOTE — Assessment & Plan Note (Signed)
Stable, continue fosamax and calcium, plan to update dexa in 1 year.

## 2023-07-22 NOTE — Assessment & Plan Note (Signed)
On asprin 81 mg and statin for secondary stroke prevention.

## 2023-07-23 ENCOUNTER — Encounter: Payer: Self-pay | Admitting: Family

## 2023-08-16 ENCOUNTER — Ambulatory Visit (INDEPENDENT_AMBULATORY_CARE_PROVIDER_SITE_OTHER): Payer: Medicare PPO

## 2023-08-16 DIAGNOSIS — E538 Deficiency of other specified B group vitamins: Secondary | ICD-10-CM | POA: Diagnosis not present

## 2023-08-16 MED ORDER — CYANOCOBALAMIN 1000 MCG/ML IJ SOLN
1000.0000 ug | Freq: Once | INTRAMUSCULAR | Status: AC
  Administered 2023-08-16: 1000 ug via INTRAMUSCULAR

## 2023-08-16 NOTE — Progress Notes (Signed)
Ruth Jensen is a 72 y.o. female presents to the office today for Monthly B12 injections, per physician's orders. Original order: 07/22/2023 B12 deficiency       Stable, continues monthly b12 shots.     Cyanocobalamin 1000 mg/ml IM was administered L Deltoid today. Patient tolerated injection. Patient due for follow up labs/provider appt: No.  Patient next injection due: 1 month, appt made Yes    Creft, Melton Alar L

## 2023-09-10 DIAGNOSIS — H43393 Other vitreous opacities, bilateral: Secondary | ICD-10-CM | POA: Diagnosis not present

## 2023-09-10 DIAGNOSIS — H2513 Age-related nuclear cataract, bilateral: Secondary | ICD-10-CM | POA: Diagnosis not present

## 2023-09-17 ENCOUNTER — Telehealth: Payer: Self-pay

## 2023-09-17 ENCOUNTER — Ambulatory Visit (INDEPENDENT_AMBULATORY_CARE_PROVIDER_SITE_OTHER): Payer: Medicare PPO

## 2023-09-17 ENCOUNTER — Other Ambulatory Visit: Payer: Self-pay

## 2023-09-17 DIAGNOSIS — I5189 Other ill-defined heart diseases: Secondary | ICD-10-CM

## 2023-09-17 DIAGNOSIS — M19072 Primary osteoarthritis, left ankle and foot: Secondary | ICD-10-CM | POA: Diagnosis not present

## 2023-09-17 DIAGNOSIS — E538 Deficiency of other specified B group vitamins: Secondary | ICD-10-CM

## 2023-09-17 MED ORDER — CYANOCOBALAMIN 1000 MCG/ML IJ SOLN
1000.0000 ug | Freq: Once | INTRAMUSCULAR | Status: AC
  Administered 2023-09-17: 1000 ug via INTRAMUSCULAR

## 2023-09-17 NOTE — Telephone Encounter (Signed)
DME order entered and community message sent to [employer] representatives for processing. Patient notified.

## 2023-09-17 NOTE — Telephone Encounter (Signed)
Pt was in for her B12 injection today and asked for a script for a "3 in 1 Commode"  to be faxed to [phone] and [phone].   Hers has broken.

## 2023-09-17 NOTE — Telephone Encounter (Signed)
Pt would like a call after we have faxed the script.

## 2023-09-17 NOTE — Telephone Encounter (Signed)
OK to fax script for 3 in 1 commode please.  Diagnosis, diastolic sysfunction.

## 2023-09-17 NOTE — Telephone Encounter (Signed)
 error

## 2023-09-17 NOTE — Progress Notes (Addendum)
Ruth Jensen is a 73 y.o. female  presents to the office today for Monthly B12 injections, per physician's orders. Original order: 07/22/2023     B12 deficiency         Stable, continues monthly b12 shots.     Cyanocobalamin 1000 mg/ml IM was administered R  Deltoid today. Patient tolerated injection. Patient due for follow up labs/provider appt: No.  Patient next injection due: 1 month, appt made Yes    Creft, Melton Alar L

## 2023-10-10 ENCOUNTER — Other Ambulatory Visit: Payer: Self-pay | Admitting: Family

## 2023-10-10 DIAGNOSIS — I10 Essential (primary) hypertension: Secondary | ICD-10-CM

## 2023-10-11 ENCOUNTER — Other Ambulatory Visit: Payer: Self-pay | Admitting: Family

## 2023-10-11 DIAGNOSIS — I10 Essential (primary) hypertension: Secondary | ICD-10-CM

## 2023-10-18 ENCOUNTER — Ambulatory Visit: Payer: Medicare PPO

## 2023-10-23 ENCOUNTER — Ambulatory Visit (INDEPENDENT_AMBULATORY_CARE_PROVIDER_SITE_OTHER): Payer: Medicare PPO | Admitting: Emergency Medicine

## 2023-10-23 DIAGNOSIS — E538 Deficiency of other specified B group vitamins: Secondary | ICD-10-CM

## 2023-10-23 MED ORDER — CYANOCOBALAMIN 1000 MCG/ML IJ SOLN
1000.0000 ug | Freq: Once | INTRAMUSCULAR | Status: AC
  Administered 2023-10-23: 1000 ug via INTRAMUSCULAR

## 2023-10-23 NOTE — Progress Notes (Signed)
 Patient here for monthly b12 injection per physicians order.  Injection given in left deltoid and patient tolerated well.

## 2023-11-20 ENCOUNTER — Ambulatory Visit: Payer: Medicare PPO

## 2023-11-22 ENCOUNTER — Ambulatory Visit (INDEPENDENT_AMBULATORY_CARE_PROVIDER_SITE_OTHER)

## 2023-11-22 DIAGNOSIS — E538 Deficiency of other specified B group vitamins: Secondary | ICD-10-CM

## 2023-11-22 MED ORDER — CYANOCOBALAMIN 1000 MCG/ML IJ SOLN
1000.0000 ug | Freq: Once | INTRAMUSCULAR | Status: AC
  Administered 2023-11-22: 1000 ug via INTRAMUSCULAR

## 2023-11-22 NOTE — Progress Notes (Signed)
 Pt here for monthly B12 injection per Melissa  B12 given IM, and pt tolerated injection well.  Next B12 injection scheduled for 04/29

## 2023-12-05 ENCOUNTER — Other Ambulatory Visit: Payer: Self-pay

## 2023-12-05 ENCOUNTER — Encounter (HOSPITAL_BASED_OUTPATIENT_CLINIC_OR_DEPARTMENT_OTHER): Payer: Self-pay | Admitting: Emergency Medicine

## 2023-12-05 ENCOUNTER — Emergency Department (HOSPITAL_BASED_OUTPATIENT_CLINIC_OR_DEPARTMENT_OTHER)
Admission: EM | Admit: 2023-12-05 | Discharge: 2023-12-05 | Disposition: A | Discharge status: Home / Self Care | Attending: Emergency Medicine | Admitting: Emergency Medicine

## 2023-12-05 DIAGNOSIS — Z7982 Long term (current) use of aspirin: Secondary | ICD-10-CM | POA: Insufficient documentation

## 2023-12-05 DIAGNOSIS — M545 Low back pain, unspecified: Secondary | ICD-10-CM | POA: Insufficient documentation

## 2023-12-05 DIAGNOSIS — M5459 Other low back pain: Secondary | ICD-10-CM | POA: Diagnosis not present

## 2023-12-05 DIAGNOSIS — M549 Dorsalgia, unspecified: Secondary | ICD-10-CM | POA: Diagnosis present

## 2023-12-05 MED ORDER — PREDNISONE 20 MG PO TABS: 40.0000 mg | ORAL_TABLET | Freq: Every day | ORAL | 0 refills | Status: DC

## 2023-12-05 MED ORDER — METHOCARBAMOL 500 MG PO TABS: 500.0000 mg | ORAL_TABLET | Freq: Four times a day (QID) | ORAL | 0 refills | Status: DC

## 2023-12-05 NOTE — ED Provider Notes (Signed)
 [place] [facility] Provider Note   CSN: [id number] Arrival date & time: 12/05/23  1241     History  Chief Complaint  Patient presents with   Back Pain    Ruth Jensen is a 73 y.o. female.  Patient to ED for evaluation of back pain x 1 month. No inciting injury. No history of back issues. The pain is sharp, stabbing pain that is brought on by movement. No abdominal pain, fever, flank pain, urinary symptoms. She denies lower extremity pain, numbness or weakness.   The history is provided by the patient. No language interpreter was used.  Back Pain      Home Medications Prior to Admission medications   Medication Sig Start Date End Date Taking? Authorizing Provider  methocarbamol (ROBAXIN) 500 MG tablet Take 1 tablet (500 mg total) by mouth 4 (four) times daily. 12/05/23  Yes Davonta Stroot, Melvenia Beam, PA-C  predniSONE (DELTASONE) 20 MG tablet Take 2 tablets (40 mg total) by mouth daily. 12/05/23  Yes Latricia Cerrito, Melvenia Beam, PA-C  alendronate (FOSAMAX) 70 MG tablet TAKE 1 TABLET(70 MG) BY MOUTH EVERY 7 DAYS WITH A FULL GLASS OF WATER AND ON AN EMPTY STOMACH 01/16/23   Sandford Craze, NP  aspirin EC 81 MG tablet Take 2 tablets (162 mg total) by mouth daily. Swallow whole. 01/16/23   Sandford Craze, NP  atorvastatin (LIPITOR) 40 MG tablet TAKE 1 TABLET(40 MG) BY MOUTH DAILY 10/10/23   Sandford Craze, NP  Calcium Carbonate-Vit D-Min (CALTRATE 600+D PLUS MINERALS) 600-800 MG-UNIT TABS Take 1 tablet by mouth twice daily 10/20/21   Sandford Craze, NP  Cholecalciferol (VITAMIN D3) 10 MCG (400 UNIT) CAPS Take by mouth.    [provider]  Iron, Ferrous Sulfate, 325 (65 Fe) MG TABS Take 325 mg by mouth every other day. 05/08/22   Sandford Craze, NP  lisinopril (ZESTRIL) 5 MG tablet Take 1 tablet (5 mg total) by mouth daily. 11/14/22   Tobb, Kardie, DO  metoprolol succinate (TOPROL-XL) 50 MG 24 hr tablet TAKE 1 TABLET(50 MG) BY MOUTH DAILY WITH  OR IMMEDIATELY FOLLOWING A MEAL 10/10/23   Sandford Craze, NP  nitroGLYCERIN (NITROSTAT) 0.4 MG SL tablet Place 1 tablet (0.4 mg total) under the tongue every 5 (five) minutes as needed. 01/16/23 04/16/23  Sandford Craze, NP  omeprazole (PRILOSEC) 40 MG capsule Take 1 capsule (40 mg total) by mouth daily. 07/22/23   Sandford Craze, NP      Allergies    Patient has no known allergies.    Review of Systems   Review of Systems  Musculoskeletal:  Positive for back pain.    Physical Exam Updated Vital Signs BP (!) 149/80 (BP Location: Left Arm)   Pulse 84   Temp 98.4 F (36.9 C) (Oral)   Resp 18   SpO2 99%  Physical Exam Vitals and nursing note reviewed.  Constitutional:      Appearance: She is well-developed.  HENT:     Head: Normocephalic.  Cardiovascular:     Rate and Rhythm: Normal rate.  Pulmonary:     Effort: Pulmonary effort is normal.  Abdominal:     Palpations: Abdomen is soft.     Tenderness: There is no abdominal tenderness.  Musculoskeletal:        General: Normal range of motion.     Cervical back: Normal range of motion and neck supple.     Comments: Mild paralumbar tenderness without swelling or discoloration. FROM LE's with full and symmetric strength.  Skin:    General: Skin is warm and dry.  Neurological:     Mental Status: She is alert and oriented to person, place, and time.     Sensory: No sensory deficit.     Deep Tendon Reflexes: Reflexes are normal and symmetric. Reflexes normal.     ED Results / Procedures / Treatments   Labs (all labs ordered are listed, but only abnormal results are displayed) Labs Reviewed - No data to display  EKG None  Radiology No results found.  Procedures Procedures    Medications Ordered in ED Medications - No data to display  ED Course/ Medical Decision Making/ A&P Clinical Course as of 12/05/23 1442  Thu Dec 05, 2023  1430 Patient to ED with back pain with movement x 1 month. No neurologic  red flags. Exam/history c/w sciatica vs muscular spasm. Will provide 3-day prednisone, Robaxin.  [SU]    Clinical Course User Index [SU] Elpidio Anis, PA-C                                 Medical Decision Making          Final Clinical Impression(s) / ED Diagnoses Final diagnoses:  Acute right-sided low back pain without sciatica    Rx / DC Orders ED Discharge Orders          Ordered    predniSONE (DELTASONE) 20 MG tablet  Daily        12/05/23 1442    methocarbamol (ROBAXIN) 500 MG tablet  4 times daily        12/05/23 1442              Elpidio Anis, PA-C 12/05/23 1442    Rondel Baton, MD 12/06/23 1727

## 2023-12-05 NOTE — Discharge Instructions (Signed)
 Take prednisone and Robaxin as prescribed. Use warm compresses as instructed. See your doctor if pain is no better in 1 week.

## 2023-12-05 NOTE — ED Triage Notes (Signed)
 C/o lower back pain and R hip pain x 1 month. Denies recent injuries. States pian is worse when sitting for a long time. Denies urinary issues.

## 2023-12-11 ENCOUNTER — Ambulatory Visit (INDEPENDENT_AMBULATORY_CARE_PROVIDER_SITE_OTHER): Admitting: Family

## 2023-12-11 VITALS — BP 138/62 | HR 78 | Temp 98.5°F | Resp 16 | Ht 61.0 in | Wt 170.0 lb

## 2023-12-11 DIAGNOSIS — M5416 Radiculopathy, lumbar region: Secondary | ICD-10-CM | POA: Insufficient documentation

## 2023-12-11 MED ORDER — METHYLPREDNISOLONE 4 MG PO TBPK: ORAL_TABLET | ORAL | 0 refills | Status: DC

## 2023-12-11 NOTE — Progress Notes (Signed)
 Subjective:     Patient ID: Ruth Jensen, female    DOB: [birth year], 73 y.o.   MRN: [id number]  Chief Complaint  Patient presents with   Follow-up    Patient here for ed follow up   Back Pain    Complains of low back pain on right side   Hypothyroidism    Back Pain    Discussed the use of AI scribe software for clinical note transcription with the patient, who gave verbal consent to proceed.  History of Present Illness  he is a 73 year old female who presents with right hip pain.  She has been experiencing right hip pain for the past month, which worsens with movement and sometimes feels like it 'catches.' The pain has been constant, and she has not found relief with heat application, which she feels may have worsened the pain.  She visited the emergency department on 4/10 due to severe pain and was prescribed prednisone 40mg  x 3 days and Robaxin. She completed the prednisone course but has not taken Robaxin due to concerns about using a muscle relaxer.  No urinary symptoms, saddle anesthesia, or lower extremity weakness. Her legs feel normal, and she denies tenderness to palpation. She mentions that stepping on her left leg causes pain in her back, while stepping on her right leg does not.  She has recently obtained glasses, which have improved her vision.     Health Maintenance Due  Topic Date Due   DTaP/Tdap/Td (1 - Tdap) Never done   Zoster Vaccines- Shingrix (1 of 2) Never done    Past Medical History:  Diagnosis Date   Abnormal echocardiogram 07/22/2019   Abnormal EKG 07/22/2019   Acute deep vein thrombosis (DVT) of popliteal vein of right lower extremity (HCC) 07/01/2019   Acute pulmonary embolism (HCC) 07/02/2019   Chest pain 07/22/2019   Depressed left ventricular ejection fraction 07/22/2019   Diastolic dysfunction    DVT (deep venous thrombosis) (HCC) 07/01/2019   History of DVT (deep vein thrombosis) 07/01/2019   History of pulmonary embolism  07/02/2019   Hypertension    Lipid screening 07/22/2019   Obesity (BMI 30-39.9) 07/22/2019   Osteoporosis 11/18/2019   Pulmonary embolism (HCC) 07/02/2019   Single subsegmental pulmonary embolism without acute cor pulmonale (HCC) 07/01/2019    Past Surgical History:  Procedure Laterality Date   ABDOMINAL HYSTERECTOMY  2010   due to fibroids   BREAST BIOPSY Left    BREAST CYST EXCISION Left    COLONOSCOPY     [facility]. Late 2000's    LEFT HEART CATH AND CORONARY ANGIOGRAPHY N/A 08/03/2019   Procedure: LEFT HEART CATH AND CORONARY ANGIOGRAPHY;  Surgeon: Avanell Leigh, MD;  Location: [facility] INVASIVE CV [facility];  Service: Cardiovascular;  Laterality: N/A;   mitral valve regurg  2023   moderat per 2D echo   OOPHORECTOMY      Family History  Problem Relation Age of Onset   Hypertension Mother    CVA Mother        hemiparesis   Hypertension Father    Dementia Father    Prostate cancer Father    Hypertension Sister    CAD Sister        scheduled for pacemaker   Hypertension Sister    Skin cancer Sister        melanoma died at age 42   Hypertension Sister        ? PFO repair   Parkinson's disease Sister  Asthma Paternal Grandfather    Hypertension Daughter    Hypertension Son        pacemaker   Colon cancer Neg Hx    Esophageal cancer Neg Hx     Social History   Socioeconomic History   Marital status: Married    Spouse name: Merlyn Starring   Number of children: 2   Years of education: Not on file   Highest education level: Not on file  Occupational History   Occupation: retired   Tobacco Use   Smoking status: Never   Smokeless tobacco: Never  Vaping Use   Vaping status: Never Used  Substance and Sexual Activity   Alcohol use: Not Currently   Drug use: Never   Sexual activity: Not Currently  Other Topics Concern   Not on file  Social History Narrative   Married   2 children (son and daughter) live locally   4 grandchildren   Retired   Used to work in  Investment banker, 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   No pets   Enjoys spending time with grandchildren, baseball games, walking   Social Drivers of Health   Financial Resource Strain: Low Risk  (02/22/2021)   Overall Financial Resource Strain (CARDIA)    Difficulty of Paying Living Expenses: Not hard at all  Food Insecurity: No Food Insecurity (03/04/2023)   Hunger Vital Sign    Worried About Running Out of Food in the Last Year: Never true    Ran Out of Food in the Last Year: Never true  Transportation Needs: No Transportation Needs (03/04/2023)   PRAPARE - Administrator, Civil Service (Medical): No    Lack of Transportation (Non-Medical): No  Physical Activity: Inactive (03/04/2023)   Exercise Vital Sign    Days of Exercise per Week: 0 days    Minutes of Exercise per Session: 0 min  Stress: No Stress Concern Present (02/22/2021)   [employer] of Occupational Health - Occupational Stress Questionnaire    Feeling of Stress : Not at all  Social Connections: Moderately Integrated (02/22/2021)   Social Connection and Isolation Panel [NHANES]    Frequency of Communication with Friends and Family: More than three times a week    Frequency of Social Gatherings with Friends and Family: More than three times a week    Attends Religious Services: More than 4 times per year    Active Member of [employer] or Organizations: No    Attends Banker Meetings: Never    Marital Status: Married  Catering manager Violence: Not At Risk (03/04/2023)   Humiliation, Afraid, Rape, and Kick questionnaire    Fear of Current or Ex-Partner: No    Emotionally Abused: No    Physically Abused: No    Sexually Abused: No    Outpatient Medications Prior to Visit  Medication Sig Dispense Refill   alendronate (FOSAMAX) 70 MG tablet TAKE 1 TABLET(70 MG) BY MOUTH EVERY 7 DAYS WITH A FULL GLASS OF WATER AND ON AN EMPTY STOMACH 12 tablet 4   aspirin EC 81 MG tablet Take 2 tablets (162 mg total) by mouth daily. Swallow  whole. 30 tablet 12   atorvastatin (LIPITOR) 40 MG tablet TAKE 1 TABLET(40 MG) BY MOUTH DAILY 90 tablet 1   Calcium Carbonate-Vit D-Min (CALTRATE 600+D PLUS MINERALS) 600-800 MG-UNIT TABS Take 1 tablet by mouth twice daily     Cholecalciferol (VITAMIN D3) 10 MCG (400 UNIT) CAPS Take by mouth.     Iron, Ferrous Sulfate, 325 (65 Fe) MG TABS  Take 325 mg by mouth every other day. 30 tablet    lisinopril (ZESTRIL) 5 MG tablet Take 1 tablet (5 mg total) by mouth daily. 90 tablet 3   methocarbamol (ROBAXIN) 500 MG tablet Take 1 tablet (500 mg total) by mouth 4 (four) times daily. 20 tablet 0   metoprolol succinate (TOPROL-XL) 50 MG 24 hr tablet TAKE 1 TABLET(50 MG) BY MOUTH DAILY WITH OR IMMEDIATELY FOLLOWING A MEAL 90 tablet 1   omeprazole (PRILOSEC) 40 MG capsule Take 1 capsule (40 mg total) by mouth daily. 30 capsule 3   predniSONE (DELTASONE) 20 MG tablet Take 2 tablets (40 mg total) by mouth daily. 6 tablet 0   nitroGLYCERIN (NITROSTAT) 0.4 MG SL tablet Place 1 tablet (0.4 mg total) under the tongue every 5 (five) minutes as needed. 30 tablet 0   No facility-administered medications prior to visit.    No Known Allergies  Review of Systems  Musculoskeletal:  Positive for back pain.       Objective:    Physical Exam Constitutional:      General: She is not in acute distress.    Appearance: Normal appearance. She is well-developed.  HENT:     Head: Normocephalic and atraumatic.     Right Ear: External ear normal.     Left Ear: External ear normal.  Eyes:     General: No scleral icterus. Neck:     Thyroid: No thyromegaly.  Cardiovascular:     Rate and Rhythm: Normal rate and regular rhythm.     Heart sounds: Normal heart sounds. No murmur heard. Pulmonary:     Effort: Pulmonary effort is normal. No respiratory distress.     Breath sounds: Normal breath sounds. No wheezing.  Musculoskeletal:        General: No swelling.     Cervical back: Normal and neck supple.     Thoracic  back: Normal.     Lumbar back: Normal.  Skin:    General: Skin is warm and dry.  Neurological:     Mental Status: She is alert and oriented to person, place, and time.     Deep Tendon Reflexes:     Reflex Scores:      Patellar reflexes are 2+ on the right side and 2+ on the left side.    Comments: Bilateral LE strength is 5/5  Psychiatric:        Mood and Affect: Mood normal.        Behavior: Behavior normal.        Thought Content: Thought content normal.        Judgment: Judgment normal.      BP 138/62 (BP Location: Right Arm, Patient Position: Sitting, Cuff Size: Normal)   Pulse 78   Temp 98.5 F (36.9 C) (Oral)   Resp 16   Ht 5\' 1"  (1.549 m)   Wt 170 lb (77.1 kg)   SpO2 100%   BMI 32.12 kg/m  Wt Readings from Last 3 Encounters:  12/11/23 170 lb (77.1 kg)  07/22/23 190 lb (86.2 kg)  01/16/23 198 lb (89.8 kg)       Assessment & Plan:   Problem List Items Addressed This Visit       Unprioritized   Lumbar radiculopathy - Primary    Chronic low back pain exacerbated by right hip movement. Differential includes degenerative changes, bulging discs, or nerve impingement. Medrol dose pack prescribed for inflammation management. Muscle relaxer advised for symptom relief, particularly at night. - Prescribe  Medrol dose pack for inflammation management. - Advise use of muscle relaxer (Robaxin) for symptom relief, advised to take at night due to risk of drowsiness. - Instructed pt to call in one week to report progress. - Consider physical therapy referral if symptoms improve. - Consider MRI if physical therapy is not effective and symptoms persist.      Relevant Medications   methylPREDNISolone (MEDROL DOSEPAK) 4 MG TBPK tablet    I have discontinued Ailah L. Aird's predniSONE. I am also having her start on methylPREDNISolone. Additionally, I am having her maintain her Vitamin D3, Caltrate 600+D Plus Minerals, Iron (Ferrous Sulfate), lisinopril, aspirin EC,  nitroGLYCERIN, alendronate, omeprazole, metoprolol succinate, atorvastatin, and methocarbamol.  Meds ordered this encounter  Medications   methylPREDNISolone (MEDROL DOSEPAK) 4 MG TBPK tablet    Sig: Take per package instructions.    Dispense:  21 tablet    Refill:  0    Supervising Provider:   Randie Bustle A [[id number]]

## 2023-12-11 NOTE — Progress Notes (Addendum)
   Established Patient Office Visit  Subjective   Patient ID: Ruth Jensen, female    DOB: 03-Sep-1950  Age: 73 y.o. MRN: [id number]  Chief Complaint  Patient presents with   Follow-up    Patient here for ed follow up   Back Pain    Complains of low back pain on right side   Hypothyroidism    Patient presents for follow-up of ED visit for back pain. She reports that this pain started approximately one month ago. In the ED, she was prescribed prednisone and Robaxin. She has since completed the prednisone. Her pain is in the right paralumbar/parasacral region and radiates to right hip. She denies any known injury. She also denies dysuria and saddle anesthesia.   Back Pain Pertinent negatives include no dysuria or fever.    Review of Systems  Constitutional:  Negative for chills and fever.  Genitourinary:  Negative for dysuria, flank pain and hematuria.  Musculoskeletal:  Positive for back pain. Negative for falls.      Objective:    Physical Exam Vitals reviewed.  Constitutional:      Appearance: Normal appearance.  HENT:     Head: Normocephalic and atraumatic.     Right Ear: External ear normal.     Left Ear: External ear normal.  Cardiovascular:     Rate and Rhythm: Normal rate and regular rhythm.     Pulses: Normal pulses.     Heart sounds: Normal heart sounds.  Pulmonary:     Effort: Pulmonary effort is normal.     Breath sounds: Normal breath sounds.  Abdominal:     General: Bowel sounds are normal.     Palpations: Abdomen is soft.  Skin:    General: Skin is warm and dry.     Capillary Refill: Capillary refill takes less than 2 seconds.  Neurological:     Mental Status: She is alert and oriented to person, place, and time.  Psychiatric:        Mood and Affect: Mood normal.        Behavior: Behavior normal.      Assessment & Plan:   Back pain - new problem. Differential includes musculoskeletal strain, degenerative changes, HNP, spinal stenosis, or nerve  impingement. Start Medrol dosepak. Encouraged patient to take Robaxin at night. Patient will call the office in 1 week to update on progress. If symptoms have not improved, consider SM/ortho referral and/or PT referral. Consider MRI if PT ineffective.     Wilford Hanks, RN

## 2023-12-11 NOTE — Patient Instructions (Signed)
 VISIT SUMMARY:  You came in today because of right hip pain that has been bothering you for the past month. The pain worsens with movement and sometimes feels like it 'catches.' You have tried heat application without relief and have been to the emergency department where you were prescribed prednisone and Robaxin. You completed the prednisone course but have not taken Robaxin due to concerns about using a muscle relaxer. You have no urinary symptoms, saddle anesthesia, or lower extremity weakness. Your legs feel normal, and you deny tenderness to palpation. Stepping on your left leg causes pain in your back, while stepping on your right leg does not. You also mentioned that your new glasses have improved your vision.  YOUR PLAN:  -LOW BACK PAIN WITH RADICULOPATHY: Your chronic low back pain may be due to degenerative changes, bulging discs, or nerve impingement. We have prescribed a Medrol dose pack to help manage inflammation. You are also advised to use the muscle relaxer (Robaxin) for symptom relief in the evenings as needed. Please call us  in one week to report your progress. If your symptoms improve, we may consider referring you to physical therapy. If physical therapy is not effective and your symptoms persist, we may consider an MRI.  INSTRUCTIONS:  Please call us  in one week to report your progress. If your symptoms improve, we may consider referring you to physical therapy. If physical therapy is not effective and your symptoms persist, we may consider an MRI.

## 2023-12-11 NOTE — Assessment & Plan Note (Signed)
  Chronic low back pain exacerbated by right hip movement. Differential includes degenerative changes, bulging discs, or nerve impingement. Medrol dose pack prescribed for inflammation management. Muscle relaxer advised for symptom relief, particularly at night. - Prescribe Medrol dose pack for inflammation management. - Advise use of muscle relaxer (Robaxin) for symptom relief, advised to take at night due to risk of drowsiness. - Instructed pt to call in one week to report progress. - Consider physical therapy referral if symptoms improve. - Consider MRI if physical therapy is not effective and symptoms persist.

## 2023-12-25 ENCOUNTER — Ambulatory Visit

## 2024-01-07 ENCOUNTER — Other Ambulatory Visit: Payer: Self-pay | Admitting: Cardiology

## 2024-01-09 ENCOUNTER — Other Ambulatory Visit: Payer: Self-pay | Admitting: Family

## 2024-01-09 DIAGNOSIS — K219 Gastro-esophageal reflux disease without esophagitis: Secondary | ICD-10-CM

## 2024-01-21 ENCOUNTER — Ambulatory Visit: Payer: Medicare PPO | Admitting: Family

## 2024-02-05 ENCOUNTER — Encounter: Payer: Self-pay | Admitting: Family

## 2024-02-05 ENCOUNTER — Ambulatory Visit (INDEPENDENT_AMBULATORY_CARE_PROVIDER_SITE_OTHER): Admitting: Family

## 2024-02-05 VITALS — BP 113/76 | HR 83 | Temp 98.9°F | Resp 16 | Ht 61.0 in | Wt 168.0 lb

## 2024-02-05 DIAGNOSIS — K219 Gastro-esophageal reflux disease without esophagitis: Secondary | ICD-10-CM

## 2024-02-05 DIAGNOSIS — E538 Deficiency of other specified B group vitamins: Secondary | ICD-10-CM | POA: Diagnosis not present

## 2024-02-05 DIAGNOSIS — Z8673 Personal history of transient ischemic attack (TIA), and cerebral infarction without residual deficits: Secondary | ICD-10-CM

## 2024-02-05 DIAGNOSIS — D500 Iron deficiency anemia secondary to blood loss (chronic): Secondary | ICD-10-CM

## 2024-02-05 DIAGNOSIS — E782 Mixed hyperlipidemia: Secondary | ICD-10-CM | POA: Diagnosis not present

## 2024-02-05 DIAGNOSIS — M810 Age-related osteoporosis without current pathological fracture: Secondary | ICD-10-CM | POA: Diagnosis not present

## 2024-02-05 DIAGNOSIS — R6889 Other general symptoms and signs: Secondary | ICD-10-CM | POA: Diagnosis not present

## 2024-02-05 DIAGNOSIS — I10 Essential (primary) hypertension: Secondary | ICD-10-CM

## 2024-02-05 DIAGNOSIS — M5416 Radiculopathy, lumbar region: Secondary | ICD-10-CM | POA: Diagnosis not present

## 2024-02-05 DIAGNOSIS — I428 Other cardiomyopathies: Secondary | ICD-10-CM

## 2024-02-05 MED ORDER — CYANOCOBALAMIN 1000 MCG/ML IJ SOLN
1000.0000 ug | Freq: Once | INTRAMUSCULAR | Status: AC
  Administered 2024-02-05: 1000 ug via INTRAMUSCULAR

## 2024-02-05 NOTE — Progress Notes (Signed)
 Subjective:     Patient ID: Ruth Jensen, female    DOB: 1951/05/17, 72 y.o.   MRN: [id number]  Chief Complaint  Patient presents with   Hypertension    Here for follow up   Osteoporosis    Here for followu p    Hypertension    Discussed the use of AI scribe software for clinical note transcription with the patient, who gave verbal consent to proceed.  History of Present Illness  Ruth Jensen is a 73 year old female with hypertension and hyperlipidemia who presents for medication follow-up.  Her blood pressure is well-controlled with lisinopril  5 mg and metoprolol  50 mg daily. She also takes a baby aspirin  and a cholesterol medication. Her cholesterol was last checked a year ago and was within normal limits.  She experiences occasional pain when sitting for extended periods, which she manages independently. Her gastroesophageal reflux disease is managed with omeprazole  every other day, which she finds sufficient. She takes an iron  supplement every other night without issues of black or bloody stools. She has episodes of feeling very hot without sweating, occurring at any time of the day or night, with no identified triggers.     Health Maintenance Due  Topic Date Due   DTaP/Tdap/Td (1 - Tdap) Never done   Zoster Vaccines- Shingrix  (1 of 2) Never done   COVID-19 Vaccine (7 - Pfizer risk 2024-25 season) 01/19/2024   Medicare Annual Wellness (AWV)  03/03/2024    Past Medical History:  Diagnosis Date   Abnormal echocardiogram 07/22/2019   Acute deep vein thrombosis (DVT) of popliteal vein of right lower extremity (HCC) 07/01/2019   Chest pain 07/22/2019   Depressed left ventricular ejection fraction 07/22/2019   Diastolic dysfunction    DVT (deep venous thrombosis) (HCC) 07/01/2019   History of DVT (deep vein thrombosis) 07/01/2019   Hypertension    Lipid screening 07/22/2019   Obesity (BMI 30-39.9) 07/22/2019   Osteoporosis 11/18/2019   Single subsegmental  pulmonary embolism without acute cor pulmonale (HCC) 07/01/2019    Past Surgical History:  Procedure Laterality Date   ABDOMINAL HYSTERECTOMY  2010   due to fibroids   BREAST BIOPSY Left    BREAST CYST EXCISION Left    COLONOSCOPY     [facility]. Late 2000's    LEFT HEART CATH AND CORONARY ANGIOGRAPHY N/A 08/03/2019   Procedure: LEFT HEART CATH AND CORONARY ANGIOGRAPHY;  Surgeon: Avanell Leigh, MD;  Location: [facility] INVASIVE CV LAB;  Service: Cardiovascular;  Laterality: N/A;   mitral valve regurg  2023   moderat per 2D echo   OOPHORECTOMY      Family History  Problem Relation Age of Onset   Hypertension Mother    CVA Mother        hemiparesis   Hypertension Father    Dementia Father    Prostate cancer Father    Hypertension Sister    CAD Sister        scheduled for pacemaker   Hypertension Sister    Skin cancer Sister        melanoma died at age 66   Hypertension Sister        ? PFO repair   Parkinson's disease Sister    Asthma Paternal Grandfather    Hypertension Daughter    Hypertension Son        pacemaker   Colon cancer Neg Hx    Esophageal cancer Neg Hx     Social History  Socioeconomic History   Marital status: Married    Spouse name: Merlyn Starring   Number of children: 2   Years of education: Not on file   Highest education level: Not on file  Occupational History   Occupation: retired   Tobacco Use   Smoking status: Never   Smokeless tobacco: Never  Vaping Use   Vaping status: Never Used  Substance and Sexual Activity   Alcohol use: Not Currently   Drug use: Never   Sexual activity: Not Currently  Other Topics Concern   Not on file  Social History Narrative   Married   2 children (son and daughter) live locally   4 grandchildren   Retired   Used to work in Investment banker, 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   No pets   Enjoys spending time with grandchildren, baseball games, walking   Social Drivers of Health   Financial Resource Strain: Low Risk  (02/22/2021)    Overall Financial Resource Strain (CARDIA)    Difficulty of Paying Living Expenses: Not hard at all  Food Insecurity: No Food Insecurity (03/04/2023)   Hunger Vital Sign    Worried About Running Out of Food in the Last Year: Never true    Ran Out of Food in the Last Year: Never true  Transportation Needs: No Transportation Needs (03/04/2023)   PRAPARE - Administrator, Civil Service (Medical): No    Lack of Transportation (Non-Medical): No  Physical Activity: Inactive (03/04/2023)   Exercise Vital Sign    Days of Exercise per Week: 0 days    Minutes of Exercise per Session: 0 min  Stress: No Stress Concern Present (02/22/2021)   [employer] of Occupational Health - Occupational Stress Questionnaire    Feeling of Stress : Not at all  Social Connections: Moderately Integrated (02/22/2021)   Social Connection and Isolation Panel [NHANES]    Frequency of Communication with Friends and Family: More than three times a week    Frequency of Social Gatherings with Friends and Family: More than three times a week    Attends Religious Services: More than 4 times per year    Active Member of [employer] or Organizations: No    Attends Banker Meetings: Never    Marital Status: Married  Catering manager Violence: Not At Risk (03/04/2023)   Humiliation, Afraid, Rape, and Kick questionnaire    Fear of Current or Ex-Partner: No    Emotionally Abused: No    Physically Abused: No    Sexually Abused: No    Outpatient Medications Prior to Visit  Medication Sig Dispense Refill   alendronate  (FOSAMAX ) 70 MG tablet TAKE 1 TABLET(70 MG) BY MOUTH EVERY 7 DAYS WITH A FULL GLASS OF WATER AND ON AN EMPTY STOMACH 12 tablet 4   aspirin  EC 81 MG tablet Take 2 tablets (162 mg total) by mouth daily. Swallow whole. 30 tablet 12   atorvastatin  (LIPITOR) 40 MG tablet TAKE 1 TABLET(40 MG) BY MOUTH DAILY 90 tablet 1   Calcium  Carbonate-Vit D-Min (CALTRATE 600+D PLUS MINERALS) 600-800 MG-UNIT TABS Take  1 tablet by mouth twice daily     Cholecalciferol (VITAMIN D3) 10 MCG (400 UNIT) CAPS Take by mouth.     Iron , Ferrous Sulfate , 325 (65 Fe) MG TABS Take 325 mg by mouth every other day. 30 tablet    lisinopril  (ZESTRIL ) 5 MG tablet TAKE 1 TABLET(5 MG) BY MOUTH DAILY 30 tablet 0   metoprolol  succinate (TOPROL -XL) 50 MG 24 hr tablet TAKE 1 TABLET(50 MG)  BY MOUTH DAILY WITH OR IMMEDIATELY FOLLOWING A MEAL 90 tablet 1   omeprazole  (PRILOSEC) 40 MG capsule Take 1 capsule (40 mg total) by mouth daily. 90 capsule 0   methocarbamol  (ROBAXIN ) 500 MG tablet Take 1 tablet (500 mg total) by mouth 4 (four) times daily. 20 tablet 0   methylPREDNISolone  (MEDROL  DOSEPAK) 4 MG TBPK tablet Take per package instructions. 21 tablet 0   nitroGLYCERIN  (NITROSTAT ) 0.4 MG SL tablet Place 1 tablet (0.4 mg total) under the tongue every 5 (five) minutes as needed. 30 tablet 0   No facility-administered medications prior to visit.    No Known Allergies  ROS    See HPI Objective:     Physical Exam Constitutional:      General: She is not in acute distress.    Appearance: Normal appearance. She is well-developed.  HENT:     Head: Normocephalic and atraumatic.     Right Ear: External ear normal.     Left Ear: External ear normal.  Eyes:     General: No scleral icterus. Neck:     Thyroid: No thyromegaly.  Cardiovascular:     Rate and Rhythm: Normal rate and regular rhythm.     Heart sounds: Normal heart sounds. No murmur heard. Pulmonary:     Effort: Pulmonary effort is normal. No respiratory distress.     Breath sounds: Normal breath sounds. No wheezing.  Musculoskeletal:     Cervical back: Neck supple.  Skin:    General: Skin is warm and dry.  Neurological:     Mental Status: She is alert and oriented to person, place, and time.  Psychiatric:        Mood and Affect: Mood normal.        Behavior: Behavior normal.        Thought Content: Thought content normal.        Judgment: Judgment normal.       BP 113/76 (BP Location: Right Arm, Patient Position: Sitting)   Pulse 83   Temp 98.9 F (37.2 C) (Oral)   Resp 16   Ht 5' 1 (1.549 m)   Wt 168 lb (76.2 kg)   SpO2 100%   BMI 31.74 kg/m  Wt Readings from Last 3 Encounters:  02/05/24 168 lb (76.2 kg)  12/11/23 170 lb (77.1 kg)  07/22/23 190 lb (86.2 kg)       Assessment & Plan:   Problem List Items Addressed This Visit       Unprioritized   Osteoporosis   Maintained on fosamax . Update bone density. She has been on fosamax  for about 5 years.  Consider changing to prolia or drug holiday pending review of fosamax .      Relevant Orders   DG Bone Density   NICM (nonischemic cardiomyopathy) (HCC)   Clinically stable. Followed by cardiology. Last echo 2023 had normal LVEF 60-65%.      Mixed hyperlipidemia   Lab Results  Component Value Date   CHOL 160 01/16/2023   HDL 48.70 01/16/2023   LDLCALC 93 01/16/2023   TRIG 89.0 01/16/2023   CHOLHDL 3 01/16/2023   Has been stable on lipitor, but due for update. Check today.       Relevant Orders   Lipid panel   Lumbar radiculopathy   Reports symptoms much improved.      Iron  deficiency anemia due to chronic blood loss   Lab Results  Component Value Date   WBC 6.1 07/22/2023   HGB 11.2 (L) 07/22/2023  HCT 35.8 (L) 07/22/2023   MCV 79.4 07/22/2023   PLT 284.0 07/22/2023   Clinically stable on iron  325mg  every other day.  Continue same. Update labs.      Relevant Orders   Iron , TIBC and Ferritin Panel   CBC w/Diff   History of CVA (cerebrovascular accident)   Continues atorvastatin  and aspirin  for secondary stroke prevention.       GERD (gastroesophageal reflux disease)   Stable on omeprazole - taking about every othre day.        Essential hypertension - Primary   Stable on toprol  xl 50mg  and lisinopril   5mg  daily.       Relevant Orders   Comp Met (CMET)   B12 deficiency   Update b12 injection today.      Other Visit Diagnoses       Heat  intolerance       Relevant Orders   TSH       I have discontinued Jahzaria L. Stradford's methocarbamol  and methylPREDNISolone . I am also having her maintain her Vitamin D3, Caltrate 600+D Plus Minerals, Iron  (Ferrous Sulfate ), aspirin  EC, nitroGLYCERIN , alendronate , metoprolol  succinate, atorvastatin , lisinopril , and omeprazole .  No orders of the defined types were placed in this encounter.

## 2024-02-05 NOTE — Assessment & Plan Note (Signed)
 Lab Results  Component Value Date   WBC 6.1 07/22/2023   HGB 11.2 (L) 07/22/2023   HCT 35.8 (L) 07/22/2023   MCV 79.4 07/22/2023   PLT 284.0 07/22/2023   Clinically stable on iron  325mg  every other day.  Continue same. Update labs.

## 2024-02-05 NOTE — Assessment & Plan Note (Signed)
 Stable on omeprazole - taking about every othre day.

## 2024-02-05 NOTE — Assessment & Plan Note (Signed)
 Reports symptoms much improved.

## 2024-02-05 NOTE — Addendum Note (Signed)
 Addended by: Joye Nobles on: 02/05/2024 02:05 PM   Modules accepted: Orders

## 2024-02-05 NOTE — Assessment & Plan Note (Signed)
 Clinically stable. Followed by cardiology. Last echo 2023 had normal LVEF 60-65%.

## 2024-02-05 NOTE — Patient Instructions (Signed)
 VISIT SUMMARY:  Today, we reviewed your medications and overall health. Your blood pressure is well-controlled, and we discussed updating your cholesterol and iron  levels. We also addressed your occasional hot episodes and managed your GERD. Additionally, we administered your B12 injection and planned for further tests and follow-ups.  YOUR PLAN:  HYPERTENSION: Your blood pressure is well-controlled with your current medications. -Continue taking lisinopril  5 mg daily. -Continue taking metoprolol  50 mg daily.  HYPERLIPIDEMIA: Your cholesterol levels need to be updated. -We will order a lipid panel to check your cholesterol levels.  IRON  DEFICIENCY ANEMIA: We need to update your iron  levels and blood count. -We will order a complete blood count (CBC) and iron  studies.  VITAMIN B12 DEFICIENCY: You are due for your B12 injection today. -We administered your B12 injection today.  POSSIBLE HYPERTHYROIDISM: We need to check your thyroid function to rule out hyperthyroidism. -We will order thyroid function tests.  GASTROESOPHAGEAL REFLUX DISEASE (GERD): Your heartburn is managed with omeprazole  as needed. -Continue taking omeprazole  as needed.  GENERAL HEALTH MAINTENANCE: You are due for a bone density test and we discussed the cost of the shingles vaccine. -We will order a bone density test. -Discuss the cost of the shingles vaccine with your pharmacy.  FOLLOW-UP: We need to review your lab results and have a routine check-up. -Schedule a follow-up appointment in six months.

## 2024-02-05 NOTE — Assessment & Plan Note (Signed)
 Stable on toprol  xl 50mg  and lisinopril   5mg  daily.

## 2024-02-05 NOTE — Assessment & Plan Note (Signed)
 Maintained on fosamax . Update bone density. She has been on fosamax  for about 5 years.  Consider changing to prolia or drug holiday pending review of fosamax .

## 2024-02-05 NOTE — Assessment & Plan Note (Signed)
 Continues atorvastatin  and aspirin  for secondary stroke prevention.

## 2024-02-05 NOTE — Assessment & Plan Note (Signed)
 Update b12 injection today.

## 2024-02-05 NOTE — Assessment & Plan Note (Signed)
 Lab Results  Component Value Date   CHOL 160 01/16/2023   HDL 48.70 01/16/2023   LDLCALC 93 01/16/2023   TRIG 89.0 01/16/2023   CHOLHDL 3 01/16/2023   Has been stable on lipitor, but due for update. Check today.

## 2024-02-08 ENCOUNTER — Other Ambulatory Visit: Payer: Self-pay | Admitting: Cardiology

## 2024-02-10 ENCOUNTER — Ambulatory Visit

## 2024-02-10 ENCOUNTER — Telehealth: Payer: Self-pay | Admitting: Family

## 2024-02-10 ENCOUNTER — Other Ambulatory Visit: Payer: Self-pay

## 2024-02-10 DIAGNOSIS — E0590 Thyrotoxicosis, unspecified without thyrotoxic crisis or storm: Secondary | ICD-10-CM

## 2024-02-10 DIAGNOSIS — D649 Anemia, unspecified: Secondary | ICD-10-CM

## 2024-02-10 LAB — CBC WITH DIFFERENTIAL/PLATELET
Absolute Lymphocytes: 2118 {cells}/uL (ref 850–3900)
Absolute Monocytes: 640 {cells}/uL (ref 200–950)
Basophils Absolute: 32 {cells}/uL (ref 0–200)
Basophils Relative: 0.5 %
Eosinophils Absolute: 179 {cells}/uL (ref 15–500)
Eosinophils Relative: 2.8 %
HCT: 35.5 % (ref 35.0–45.0)
Hemoglobin: 10.9 g/dL — ABNORMAL LOW (ref 11.7–15.5)
MCH: 24.3 pg — ABNORMAL LOW (ref 27.0–33.0)
MCHC: 30.7 g/dL — ABNORMAL LOW (ref 32.0–36.0)
MCV: 79.1 fL — ABNORMAL LOW (ref 80.0–100.0)
MPV: 10.6 fL (ref 7.5–12.5)
Monocytes Relative: 10 %
Neutro Abs: 3430 {cells}/uL (ref 1500–7800)
Neutrophils Relative %: 53.6 %
Platelets: 301 10*3/uL (ref 140–400)
RBC: 4.49 10*6/uL (ref 3.80–5.10)
RDW: 14.7 % (ref 11.0–15.0)
Total Lymphocyte: 33.1 %
WBC: 6.4 10*3/uL (ref 3.8–10.8)

## 2024-02-10 LAB — TEST AUTHORIZATION

## 2024-02-10 LAB — TSH: TSH: <0.01 m[IU]/L — ABNORMAL LOW (ref 0.40–4.50)

## 2024-02-10 LAB — IRON,TIBC AND FERRITIN PANEL
%SAT: 25 % (ref 16–45)
Ferritin: 135 ng/mL (ref 16–288)
Iron: 67 ug/dL (ref 45–160)
TIBC: 263 ug/dL (ref 250–450)

## 2024-02-10 LAB — LIPID PANEL
Cholesterol: 153 mg/dL (ref <200)
HDL: 44 mg/dL — ABNORMAL LOW (ref >=50)
LDL Cholesterol (Calc): 93 mg/dL
Non-HDL Cholesterol (Calc): 109 mg/dL (ref <130)
Total CHOL/HDL Ratio: 3.5 (calc) (ref <5.0)
Triglycerides: 75 mg/dL (ref <150)

## 2024-02-10 LAB — T4, FREE: Free T4: 3 ng/dL — ABNORMAL HIGH (ref 0.8–1.8)

## 2024-02-10 LAB — T3, FREE: T3, Free: 8.2 pg/mL — ABNORMAL HIGH (ref 2.3–4.2)

## 2024-02-10 NOTE — Telephone Encounter (Signed)
 Patient notified of results, provider's recommendations and comments. Labs ordered as future and alert sent to lab for this to be added. She will pick up IFOB kit today.

## 2024-02-10 NOTE — Telephone Encounter (Signed)
 Good morning- could you please check the status of her 6/11 CMET result? tks

## 2024-02-10 NOTE — Addendum Note (Signed)
 Addended by: Joye Nobles on: 02/10/2024 10:55 AM   Modules accepted: Orders

## 2024-02-10 NOTE — Telephone Encounter (Signed)
 She remains mildly anemic.  I would recommend that she complete an IFOB to check for blood loss through the stool.   Her lab work shows overactive thyroid.  This could explain why she has been feeling hot.  Can we please ask the lab to add on t3 and free t4, dx hyperthyroid?  Cholesterol looks good. Continue lipitor.

## 2024-02-11 ENCOUNTER — Ambulatory Visit: Payer: Self-pay | Admitting: Family

## 2024-02-11 DIAGNOSIS — E0590 Thyrotoxicosis, unspecified without thyrotoxic crisis or storm: Secondary | ICD-10-CM

## 2024-02-11 NOTE — Telephone Encounter (Signed)
 Please advise pt that her lab work is showing overactive thyroid. This could explain why she has been feeling so hot.  I would like to refer her to an endocrinologist for this.   Iron  levels are good. She is still a little anemic. It looks like she did not complete the IFOB that I ordered back in the fall.  Let's re-order IFOB for anemia please.

## 2024-02-12 ENCOUNTER — Other Ambulatory Visit (INDEPENDENT_AMBULATORY_CARE_PROVIDER_SITE_OTHER)

## 2024-02-12 ENCOUNTER — Other Ambulatory Visit: Payer: Self-pay

## 2024-02-12 DIAGNOSIS — D649 Anemia, unspecified: Secondary | ICD-10-CM | POA: Diagnosis not present

## 2024-02-12 MED ORDER — LISINOPRIL 5 MG PO TABS: 5.0000 mg | ORAL_TABLET | Freq: Every day | ORAL | 0 refills | Status: DC

## 2024-02-12 NOTE — Telephone Encounter (Signed)
 Patient notified of results and referral. She has the IFOB kit and will bring back today

## 2024-02-13 ENCOUNTER — Ambulatory Visit: Payer: Self-pay | Admitting: Family

## 2024-02-13 DIAGNOSIS — R195 Other fecal abnormalities: Secondary | ICD-10-CM | POA: Insufficient documentation

## 2024-02-13 LAB — FECAL OCCULT BLOOD, IMMUNOCHEMICAL: Fecal Occult Bld: POSITIVE — AB

## 2024-02-13 NOTE — Telephone Encounter (Signed)
 IFOB positive for blood.  Likely cause for her anemia.  I would like her to meet with GI.

## 2024-02-14 NOTE — Telephone Encounter (Signed)
Patient notified of results and referral 

## 2024-02-20 ENCOUNTER — Telehealth (HOSPITAL_BASED_OUTPATIENT_CLINIC_OR_DEPARTMENT_OTHER): Payer: Self-pay

## 2024-03-04 ENCOUNTER — Telehealth: Payer: Self-pay | Admitting: Family

## 2024-03-04 NOTE — Telephone Encounter (Signed)
 Copied from CRM [id number]. Topic: Clinical - Medication Question >> Mar 04, 2024 11:53 AM Gennette ORN wrote: Reason for CRM: Patient is wanting to know why the bottle states no refill for lisinopril  (ZESTRIL ) 5 MG tablet is completely out. She also stated this is something she takes everyday she wants clarification and wants this to be filled.

## 2024-03-04 NOTE — Telephone Encounter (Signed)
 Dr. Sheena prescribes lisinopril  for Pt. Error CRM created informing agent needs to have Pt contact their office.

## 2024-03-05 NOTE — Telephone Encounter (Signed)
 LVM for patient to call back regarding the below message. As per Dr. Sheena office patient is overdue for an appointment therefore prior to any refills patient has to be seen. Pharmacy was notified and should have relayed this information. If patient calls back please advise patent to call the prescribing doctor at  [facility] phone # [phone] to schedule appt.

## 2024-03-10 ENCOUNTER — Telehealth: Payer: Self-pay

## 2024-03-10 ENCOUNTER — Ambulatory Visit

## 2024-03-10 NOTE — Telephone Encounter (Signed)
 Unsuccessful attempts to reach patient on preferred number listed in notes for scheduled AWV. Left message on voicemail okay to reschedule.

## 2024-03-11 ENCOUNTER — Ambulatory Visit (INDEPENDENT_AMBULATORY_CARE_PROVIDER_SITE_OTHER)

## 2024-03-11 DIAGNOSIS — E538 Deficiency of other specified B group vitamins: Secondary | ICD-10-CM

## 2024-03-11 MED ORDER — CYANOCOBALAMIN 1000 MCG/ML IJ SOLN
1000.0000 ug | Freq: Once | INTRAMUSCULAR | Status: AC
  Administered 2024-03-11: 1000 ug via INTRAMUSCULAR

## 2024-03-11 NOTE — Progress Notes (Signed)
 Pt here for monthly B12 injection per original order dated: per Melissa O'Sullivan,NP  Last B12 injection: 02/05/24  Last B12 level:  07/22/23  B12 1000mcg given IM, left deltoid and pt tolerated injection well.  Next B12 injection scheduled for: 04/10/24

## 2024-03-14 ENCOUNTER — Telehealth: Payer: Self-pay | Admitting: Family

## 2024-03-14 NOTE — Telephone Encounter (Signed)
 It looks like her GI appointment has not been scheduled for her heme positive stool evaluation. Can you please contact pt and give her number for GI to schedule?

## 2024-03-16 NOTE — Telephone Encounter (Signed)
 Called patient but no answer, left voice mail for patient to call back.

## 2024-03-18 NOTE — Telephone Encounter (Signed)
 Pt called and unable to leave voicemail due to full mailbox

## 2024-03-18 NOTE — Telephone Encounter (Signed)
 Letter sent.

## 2024-03-30 ENCOUNTER — Other Ambulatory Visit (HOSPITAL_BASED_OUTPATIENT_CLINIC_OR_DEPARTMENT_OTHER): Payer: Self-pay | Admitting: Family

## 2024-03-30 DIAGNOSIS — Z1231 Encounter for screening mammogram for malignant neoplasm of breast: Secondary | ICD-10-CM

## 2024-04-04 ENCOUNTER — Other Ambulatory Visit: Payer: Self-pay | Admitting: Family

## 2024-04-08 ENCOUNTER — Telehealth: Payer: Self-pay | Admitting: Cardiology

## 2024-04-08 ENCOUNTER — Other Ambulatory Visit: Payer: Self-pay

## 2024-04-08 ENCOUNTER — Other Ambulatory Visit: Payer: Self-pay | Admitting: Family

## 2024-04-08 DIAGNOSIS — K219 Gastro-esophageal reflux disease without esophagitis: Secondary | ICD-10-CM

## 2024-04-08 MED ORDER — LISINOPRIL 5 MG PO TABS: 5.0000 mg | ORAL_TABLET | Freq: Every day | ORAL | 0 refills | Status: DC

## 2024-04-08 NOTE — Telephone Encounter (Signed)
*  STAT* If patient is at the pharmacy, call can be transferred to refill team.   1. Which medications need to be refilled? (please list name of each medication and dose if known)   lisinopril  (ZESTRIL ) 5 MG tablet Take 1 tablet (5 mg total) by mouth daily.     4. Which pharmacy/location (including street and city if local pharmacy) is medication to be sent to? [id number] - [place], [place] - [address] AT [facility] [address]    5. Do they need a 30 day or 90 day supply? 90  Scheduled for 06/23/24

## 2024-04-10 ENCOUNTER — Ambulatory Visit (INDEPENDENT_AMBULATORY_CARE_PROVIDER_SITE_OTHER)

## 2024-04-10 DIAGNOSIS — E538 Deficiency of other specified B group vitamins: Secondary | ICD-10-CM | POA: Diagnosis not present

## 2024-04-10 MED ORDER — CYANOCOBALAMIN 1000 MCG/ML IJ SOLN
1000.0000 ug | Freq: Once | INTRAMUSCULAR | Status: AC
  Administered 2024-04-10: 1000 ug via INTRAMUSCULAR

## 2024-04-10 NOTE — Progress Notes (Signed)
 Pt here for monthly B12 injection per original order dated: per Melissa O'Sullivan,NP   Last B12 injection:03/11/2024  Last B12 level: 07/22/2023   B12 1000mcg given IM, and left deltoid pt tolerated injection well.  Next B12 injection scheduled for: 05/11/24 @ 11:00 AM

## 2024-04-15 ENCOUNTER — Other Ambulatory Visit (HOSPITAL_BASED_OUTPATIENT_CLINIC_OR_DEPARTMENT_OTHER)

## 2024-04-17 ENCOUNTER — Ambulatory Visit: Admitting: Gastroenterology

## 2024-04-17 ENCOUNTER — Encounter: Payer: Self-pay | Admitting: Gastroenterology

## 2024-04-17 VITALS — BP 132/76 | HR 80 | Ht 61.25 in | Wt 166.0 lb

## 2024-04-17 DIAGNOSIS — R630 Anorexia: Secondary | ICD-10-CM | POA: Diagnosis not present

## 2024-04-17 DIAGNOSIS — D509 Iron deficiency anemia, unspecified: Secondary | ICD-10-CM

## 2024-04-17 DIAGNOSIS — R634 Abnormal weight loss: Secondary | ICD-10-CM

## 2024-04-17 DIAGNOSIS — E538 Deficiency of other specified B group vitamins: Secondary | ICD-10-CM

## 2024-04-17 DIAGNOSIS — R195 Other fecal abnormalities: Secondary | ICD-10-CM | POA: Diagnosis not present

## 2024-04-17 DIAGNOSIS — K219 Gastro-esophageal reflux disease without esophagitis: Secondary | ICD-10-CM | POA: Diagnosis not present

## 2024-04-17 DIAGNOSIS — K31A0 Gastric intestinal metaplasia, unspecified: Secondary | ICD-10-CM

## 2024-04-17 MED ORDER — NA SULFATE-K SULFATE-MG SULF 17.5-3.13-1.6 GM/177ML PO SOLN: 1.0000 | Freq: Once | ORAL | 0 refills | Status: AC

## 2024-04-17 NOTE — Progress Notes (Signed)
 Chief Complaint:Heme positive stool  Primary GI Doctor:Dr. 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 San for Iron  deficiency anemia and b 12 deficiency anemia. Capsule endoscopy and celiac panel ordered.  Endoscopic history: -EGD (01/12/2020, Dr. San): Hill Grade 3, moderate non-H. pylori gastritis with focal intestinal metaplasia, benign gastric polyp with intestinal metaplasia, normal duodenum (path: Slight increase IELs without villous flattening) -Colonoscopy (01/12/2020, Dr. San): 2 mm benign cecal polyp, melanosis coli, segmental nonspecific inflammation, internal hemorrhoids.  No repeat for screening due to age --VCE (04/06/20) :  - Complete study with adequate prep - First gastric image at 00:00:08 - First duodenal image at 00:33:49 - First cecal image at 01:46:07 - Small bowel transit time: 01:12:17   Findings: - Rapid small bowel transit - Single tiny, non-bleeding AVM at 1 hr 36 min, which is in the distal small bowel, approx 10 mins from the cecum. Otherwise normal study.  Interval History    Patient presents for evaluation of positive Hemoccult, accompanied by her daughter.During routine lab work it was discovered patient was mildly anemia, so IFOB was ordered which came back positive.  Patient reports her stools have been dark since she has been on iron  supplementation every other day. Patient reports she has bowel movements most days. She takes OTC miralax as needed.  Does report she has been more fatigued and finds it harder to get out of bed in the  morning.  Patient also admits to poor appetite and not feeling hungry.  Patient has lost about 4 pounds since April.  Her daughter states she has to encourage her to eat most days.  Patient taken baby aspirin  81 mg p.o. daily.  Patient also has B12 deficiency and gets B12 injections. No recent hematology visit.    Her gastroesophageal reflux disease is managed with omeprazole  every other day, which she finds sufficient. Denies dysphagia. Denies nausea or vomiting.     No new surgical history.  No new family history.  Wt Readings from Last 3 Encounters:  04/17/24 166 lb (75.3 kg)  02/05/24 168 lb (76.2 kg)  12/11/23 170 lb (77.1 kg)    Past Medical History:  Diagnosis Date   Abnormal echocardiogram 07/22/2019   Acute deep vein thrombosis (DVT) of popliteal vein of right lower extremity (HCC) 07/01/2019   Chest pain 07/22/2019   Depressed left ventricular ejection fraction 07/22/2019   Diastolic dysfunction    DVT (deep venous thrombosis) (HCC) 07/01/2019   History of DVT (deep vein thrombosis) 07/01/2019   Hypertension    Lipid screening 07/22/2019   Obesity (BMI 30-39.9) 07/22/2019   Osteoporosis 11/18/2019   Single subsegmental pulmonary embolism without acute cor pulmonale (HCC) 07/01/2019   Thyroid  disease     Past Surgical History:  Procedure Laterality Date   ABDOMINAL HYSTERECTOMY  2010   due to fibroids   BREAST BIOPSY Left    BREAST CYST EXCISION Left    COLONOSCOPY     [facility]. Late 2000's    LEFT HEART CATH AND CORONARY ANGIOGRAPHY N/A 08/03/2019   Procedure: LEFT HEART CATH AND CORONARY ANGIOGRAPHY;  Surgeon: Court Dorn PARAS, MD;  Location: [facility] INVASIVE CV LAB;  Service: Cardiovascular;  Laterality: N/A;   mitral valve regurg  2023   moderat per 2D echo   OOPHORECTOMY      Current Outpatient Medications  Medication Sig Dispense Refill   alendronate  (FOSAMAX ) 70 MG tablet TAKE 1 TABLET(70 MG) BY MOUTH EVERY 7 DAYS WITH A FULL GLASS OF WATER AND ON AN  EMPTY STOMACH 12 tablet 4   aspirin  EC 81 MG tablet Take 2 tablets (162 mg total) by mouth daily. Swallow whole. 30 tablet 12   atorvastatin  (LIPITOR) 40 MG tablet TAKE 1 TABLET(40 MG) BY MOUTH DAILY 90 tablet 1   Cholecalciferol (VITAMIN D3) 10 MCG (400 UNIT) CAPS Take by mouth.     Iron , Ferrous Sulfate , 325 (65 Fe) MG TABS Take 325 mg by mouth every other day. 30 tablet    lisinopril  (ZESTRIL ) 5 MG tablet Take 1 tablet (5 mg total) by mouth daily. 90 tablet 0   metoprolol  succinate (TOPROL -XL) 50 MG 24 hr tablet TAKE 1 TABLET(50 MG) BY MOUTH DAILY WITH OR IMMEDIATELY FOLLOWING A MEAL 90 tablet 1   Na Sulfate-K Sulfate-Mg Sulfate concentrate (SUPREP) 17.5-3.13-1.6 GM/177ML SOLN Take 1 kit (354 mLs total) by mouth once for 1 dose. 354 mL 0   nitroGLYCERIN  (NITROSTAT ) 0.4 MG SL tablet Place 1 tablet (0.4 mg total) under the tongue every 5 (five) minutes as needed. 30 tablet 0   omeprazole  (PRILOSEC) 40 MG capsule TAKE 1 CAPSULE(40 MG) BY MOUTH DAILY 90 capsule 0   No current facility-administered medications for this visit.    Allergies as of 04/17/2024   (No Known Allergies)    Family History  Problem Relation Age of Onset   Hypertension Mother    CVA Mother        hemiparesis   Hypertension Father    Dementia Father    Prostate cancer Father    Hypertension Sister    CAD Sister        scheduled for pacemaker   Hypertension Sister    Skin cancer Sister        melanoma died at age 63   Hypertension Sister        ? PFO repair   Parkinson's disease Sister    Asthma Paternal Grandfather    Hypertension Daughter    Hypertension Son        pacemaker   Colon cancer Neg Hx    Esophageal cancer Neg Hx     Review of Systems:    Constitutional: No weight loss, fever, chills, weakness or fatigue HEENT: Eyes: No change in vision               Ears, Nose, Throat:  No change in hearing or congestion Skin: No rash or itching Cardiovascular: No chest pain, chest pressure or  palpitations   Respiratory: No SOB or cough Gastrointestinal: See HPI and otherwise negative Genitourinary: No dysuria or change in urinary frequency Neurological: No headache, dizziness or syncope Musculoskeletal: No new muscle or joint pain Hematologic: No bleeding or bruising Psychiatric: No history of depression or anxiety    Physical Exam:  Vital signs: BP 132/76   Pulse 80   Ht 5' 1.25 (1.556 m) Comment: Measured in office today.  Wt 166 lb (75.3 kg)   SpO2 100%   BMI 31.11 kg/m   Constitutional:   Pleasant  female appears to be in NAD, Well developed, Well nourished, alert and cooperative Throat: Oral cavity  and pharynx without inflammation, swelling or lesion.  Respiratory: Respirations even and unlabored. Lungs clear to auscultation bilaterally.   No wheezes, crackles, or rhonchi.  Cardiovascular: Normal S1, S2. Regular rate and rhythm. No peripheral edema, cyanosis or pallor.  Gastrointestinal:  Soft, nondistended, nontender. No rebound or guarding. Normal bowel sounds. No appreciable masses or hepatomegaly. Rectal:  Not performed.  Msk:  Symmetrical without gross deformities. Without edema, no deformity or joint abnormality.  Neurologic:  Alert and  oriented x4;  grossly normal neurologically.  Skin:   Dry and intact without significant lesions or rashes.  RELEVANT LABS AND IMAGING: CBC    Latest Ref Rng & Units 02/05/2024    1:32 PM 07/22/2023   10:52 AM 01/16/2023   10:43 AM  CBC  WBC 3.8 - 10.8 Thousand/uL 6.4  6.1  6.9   Hemoglobin 11.7 - 15.5 g/dL 89.0  88.7  88.3   Hematocrit 35.0 - 45.0 % 35.5  35.8  37.3   Platelets 140 - 400 Thousand/uL 301  284.0  280.0      CMP     Latest Ref Rng & Units 01/16/2023   10:43 AM 11/14/2022   11:00 AM 06/26/2022   10:11 AM  CMP  Glucose 70 - 99 mg/dL 74  82  877   BUN 6 - 23 mg/dL 12  13  21    Creatinine 0.40 - 1.20 mg/dL 9.25  9.31  9.15   Sodium 135 - 145 mEq/L 141  145  143   Potassium 3.5 - 5.1 mEq/L 4.4  4.2   3.5   Chloride 96 - 112 mEq/L 105  107  107   CO2 19 - 32 mEq/L 30  26  29    Calcium  8.4 - 10.5 mg/dL 9.5  9.6  9.8   Total Protein 6.0 - 8.5 g/dL  6.7  6.9   Total Bilirubin 0.0 - 1.2 mg/dL  0.6  0.7   Alkaline Phos 44 - 121 IU/L  112  97   AST 0 - 40 IU/L  17  15   ALT 0 - 32 IU/L  13  12      Lab Results  Component Value Date   TSH <0.01 (L) 02/05/2024   Lab Results  Component Value Date   IRON  67 02/05/2024   TIBC 263 02/05/2024   FERRITIN 135 02/05/2024  03/2020 celiac panel negative  8/23 echo-Left ventricular ejection fraction, by estimation, is 60 to 65%.   Assessment: Encounter Diagnoses  Name Primary?   Positive fecal occult blood test    Iron  deficiency anemia, unspecified iron  deficiency anemia type    Vitamin B 12 deficiency    Gastroesophageal reflux disease, unspecified whether esophagitis present Yes   Intestinal metaplasia of gastric mucosa    Poor appetite    Loss of weight      73 year old female patient who presents with anemia and positive Hemoccult.  Patient does have history of iron  deficiency and B12 anemia.  Her most recent iron  levels were normal.  Patient takes iron  supplementation every other day.  Patient also gets B12 injections. Hgb dropped from 11.2 in November to 10.9 (June).  Patient does note more fatigue, poor appetite, and weight loss. 12/2019 Colonoscopy unrevealing for etiology. EGD with moderate non-H. pylori gastritis along with normal-appearing duodenum, but duodenal biopsies with increased intraepithelial lymphocytes without villous flattening. Celiac panel neg. Small capsule showed single tiny, non-bleeding AVM at 1 hr 36 min, which is in the distal small bowel, approx  10 mins from the cecum. Otherwise normal study.  Patient on PPI therapy.  Was a discussion at last appointment about intestinal metaplasia on the endoscopy and considering repeat EGD with GIM mapping, given her current symptoms Ruth Jensen be beneficial to complete with workup.  Will  discuss with Dr. San.   Plan: -Continue PPI therapy -Continue B12 and iron  repletion as already doing  -Schedule EGD in [facility] with Dr. San. Recommendations for GIM mapping  The risks and benefits of EGD with possible biopsies and esophageal dilation were discussed with the patient who agrees to proceed. -Schedule for a colonoscopy in [facility] with Dr. San. Instructed to take OTC Miralax week prior to procedure. The risks and benefits of colonoscopy with possible polypectomy / biopsies were discussed and the patient agrees to proceed.   Thank you for the courtesy of this consult. Please call me with any questions or concerns.   Sanjeev Main, FNP-C [facility] 04/17/2024, 12:49 PM  Cc: Jensen, Melissa, NP

## 2024-04-17 NOTE — Patient Instructions (Addendum)
 Constipation Take OTC Miralax 3-5 days up until day of colonoscopy to make sure you bowels are clean  We have sent the following medications to your pharmacy for you to pick up at your convenience: SUPREP  You have been scheduled for an endoscopy and colonoscopy. Please follow the written instructions given to you at your visit today.  If you use inhalers (even only as needed), please bring them with you on the day of your procedure.  DO NOT TAKE 7 DAYS PRIOR TO TEST- Trulicity (dulaglutide) Ozempic, Wegovy (semaglutide) Mounjaro (tirzepatide) Bydureon Bcise (exanatide extended release)  DO NOT TAKE 1 DAY PRIOR TO YOUR TEST Rybelsus (semaglutide) Adlyxin (lixisenatide) Victoza (liraglutide) Byetta (exanatide) ___________________________________________________________________________  Due to recent changes in healthcare laws, you may see the results of your imaging and laboratory studies on MyChart before your provider has had a chance to review them.  We understand that in some cases there may be results that are confusing or concerning to you. Not all laboratory results come back in the same time frame and the provider may be waiting for multiple results in order to interpret others.  Please give us  48 hours in order for your provider to thoroughly review all the results before contacting the office for clarification of your results.   _______________________________________________________  If your blood pressure at your visit was 140/90 or greater, please contact your primary care physician to follow up on this.  _______________________________________________________  If you are age 74 or older, your body mass index should be between 23-30. Your Body mass index is 31.11 kg/m. If this is out of the aforementioned range listed, please consider follow up with your Primary Care Provider.  If you are age 74 or younger, your body mass index should be between 19-25. Your Body mass  index is 31.11 kg/m. If this is out of the aformentioned range listed, please consider follow up with your Primary Care Provider.   ________________________________________________________  The [facility] providers would like to encourage you to use MYCHART to communicate with providers for non-urgent requests or questions.  Due to long hold times on the telephone, sending your provider a message by [facility] may be a faster and more efficient way to get a response.  Please allow 48 business hours for a response.  Please remember that this is for non-urgent requests.  _______________________________________________________  [facility] is using a team-based approach to care.  Your team is made up of your doctor and two to three APPS. Our APPS (Nurse Practitioners and Physician Assistants) work with your physician to ensure care continuity for you. They are fully qualified to address your health concerns and develop a treatment plan. They communicate directly with your gastroenterologist to care for you. Seeing the Advanced Practice Practitioners on your physician's team can help you by facilitating care more promptly, often allowing for earlier appointments, access to diagnostic testing, procedures, and other specialty referrals.   Thank you for trusting me with your gastrointestinal care. Deanna May, FNP-C

## 2024-04-20 ENCOUNTER — Encounter: Payer: Self-pay | Admitting: Family

## 2024-04-20 NOTE — Progress Notes (Signed)
 Agree with the assessment and plan as outlined by [facility], FNP-C.  Carlitos Bottino, DO, [facility]

## 2024-04-21 ENCOUNTER — Ambulatory Visit (INDEPENDENT_AMBULATORY_CARE_PROVIDER_SITE_OTHER)

## 2024-04-21 VITALS — Ht 61.25 in | Wt 166.0 lb

## 2024-04-21 DIAGNOSIS — Z0000 Encounter for general adult medical examination without abnormal findings: Secondary | ICD-10-CM

## 2024-04-21 NOTE — Patient Instructions (Addendum)
 Ruth Jensen , Thank you for taking time out of your busy schedule to complete your Annual Wellness Visit with me. I enjoyed our conversation and look forward to speaking with you again next year. I, as well as your care team,  appreciate your ongoing commitment to your health goals. Please review the following plan we discussed and let me know if I can assist you in the future. Your Game plan/ To Do List    Referrals: If you haven't heard from the office you've been referred to, please reach out to them at the phone provided.   Follow up Visits: We will see or speak with you next year for your Next Medicare AWV with our clinical staff 04/27/25 @ 1:50p  Have you seen your provider in the last 6 months (3 months if uncontrolled diabetes)?   Clinician Recommendations:  Aim for 30 minutes of exercise or brisk walking, 6-8 glasses of water, and 5 servings of fruits and vegetables each day.       This is a list of the screenings recommended for you:  Health Maintenance  Topic Date Due   DTaP/Tdap/Td vaccine (1 - Tdap) Never done   Zoster (Shingles) Vaccine (1 of 2) Never done   COVID-19 Vaccine (7 - Pfizer risk 2024-25 season) 01/19/2024   Flu Shot  03/27/2024   Medicare Annual Wellness Visit  04/21/2025   Mammogram  05/05/2025   Colon Cancer Screening  01/11/2030   Pneumococcal Vaccine for age over 40  Completed   DEXA scan (bone density measurement)  Completed   Hepatitis C Screening  Completed   HPV Vaccine  Aged Out   Meningitis B Vaccine  Aged Out    Advanced directives: (Declined) Advance directive discussed with you today. Even though you declined this today, please call our office should you change your mind, and we can give you the proper paperwork for you to fill out. Advance Care Planning is important because it:  [x]  Makes sure you receive the medical care that is consistent with your values, goals, and preferences  [x]  It provides guidance to your family and loved ones and  reduces their decisional burden about whether or not they are making the right decisions based on your wishes.  Follow the link provided in your after visit summary or read over the paperwork we have mailed to you to help you started getting your Advance Directives in place. If you need assistance in completing these, please reach out to us  so that we can help you!  See attachments for Preventive Care and Fall Prevention Tips.

## 2024-04-21 NOTE — Progress Notes (Signed)
 Subjective:   Ruth Jensen is a 73 y.o. who presents for a Medicare Wellness preventive visit.  As a reminder, Annual Wellness Visits don't include a physical exam, and some assessments may be limited, especially if this visit is performed virtually. We may recommend an in-person follow-up visit with your provider if needed.  Visit Complete: Virtual I connected with  Ruth Jensen on 04/21/24 by a audio enabled telemedicine application and verified that I am speaking with the correct person using two identifiers.  Patient Location: Home  Provider Location: Home Office  I discussed the limitations of evaluation and management by telemedicine. The patient expressed understanding and agreed to proceed.  Vital Signs: Because this visit was a virtual/telehealth visit, some criteria may be missing or patient reported. Any vitals not documented were not able to be obtained and vitals that have been documented are patient reported.    Persons Participating in Visit: Patient.  AWV Questionnaire: No: Patient Medicare AWV questionnaire was not completed prior to this visit.  Cardiac Risk Factors include: advanced age (>36men, >70 women);hypertension     Objective:    Today's Vitals   04/21/24 1350  Weight: 166 lb (75.3 kg)  Height: 5' 1.25 (1.556 m)   Body mass index is 31.11 kg/m.     04/21/2024    1:56 PM 12/05/2023   12:50 PM 03/04/2023   10:28 AM 06/26/2022   10:59 AM 02/28/2022   10:24 AM 02/28/2022   10:23 AM 06/26/2021   10:55 AM  Advanced Directives  Does Patient Have a Medical Advance Directive? No No No No No No No  Would patient like information on creating a medical advance directive? No - Patient declined No - Patient declined No - Patient declined No - Patient declined No - Patient declined No - Patient declined No - Patient declined    Current Medications (verified) Outpatient Encounter Medications as of 04/21/2024  Medication Sig   alendronate  (FOSAMAX ) 70 MG  tablet TAKE 1 TABLET(70 MG) BY MOUTH EVERY 7 DAYS WITH A FULL GLASS OF WATER AND ON AN EMPTY STOMACH   aspirin  EC 81 MG tablet Take 2 tablets (162 mg total) by mouth daily. Swallow whole.   atorvastatin  (LIPITOR) 40 MG tablet TAKE 1 TABLET(40 MG) BY MOUTH DAILY   Cholecalciferol (VITAMIN D3) 10 MCG (400 UNIT) CAPS Take by mouth.   Iron , Ferrous Sulfate , 325 (65 Fe) MG TABS Take 325 mg by mouth every other day.   lisinopril  (ZESTRIL ) 5 MG tablet Take 1 tablet (5 mg total) by mouth daily.   metoprolol  succinate (TOPROL -XL) 50 MG 24 hr tablet TAKE 1 TABLET(50 MG) BY MOUTH DAILY WITH OR IMMEDIATELY FOLLOWING A MEAL   nitroGLYCERIN  (NITROSTAT ) 0.4 MG SL tablet Place 1 tablet (0.4 mg total) under the tongue every 5 (five) minutes as needed.   omeprazole  (PRILOSEC) 40 MG capsule TAKE 1 CAPSULE(40 MG) BY MOUTH DAILY   No facility-administered encounter medications on file as of 04/21/2024.    Allergies (verified) Patient has no known allergies.   History: Past Medical History:  Diagnosis Date   Abnormal echocardiogram 07/22/2019   Acute deep vein thrombosis (DVT) of popliteal vein of right lower extremity (HCC) 07/01/2019   Chest pain 07/22/2019   Depressed left ventricular ejection fraction 07/22/2019   Diastolic dysfunction    DVT (deep venous thrombosis) (HCC) 07/01/2019   History of DVT (deep vein thrombosis) 07/01/2019   Hypertension    Hyperthyroidism    Lipid screening 07/22/2019  Obesity (BMI 30-39.9) 07/22/2019   Osteoporosis 11/18/2019   Single subsegmental pulmonary embolism without acute cor pulmonale (HCC) 07/01/2019   Thyroid  disease    Past Surgical History:  Procedure Laterality Date   ABDOMINAL HYSTERECTOMY  2010   due to fibroids   BREAST BIOPSY Left    BREAST CYST EXCISION Left    COLONOSCOPY     [facility]. Late 2000's    LEFT HEART CATH AND CORONARY ANGIOGRAPHY N/A 08/03/2019   Procedure: LEFT HEART CATH AND CORONARY ANGIOGRAPHY;  Surgeon: Court Dorn PARAS, MD;  Location: [facility] INVAS[facility];  Service: Cardiovascular;  Laterality: N/A;   mitral valve regurg  2023   moderat per 2D echo   OOPHORECTOMY     Family History  Problem Relation Age of Onset   Hypertension Mother    CVA Mother        hemiparesis   Hypertension Father    Dementia Father    Prostate cancer Father    Hypertension Sister    CAD Sister        scheduled for pacemaker   Hypertension Sister    Skin cancer Sister        melanoma died at age 51   Hypertension Sister        ? PFO repair   Parkinson's disease Sister    Asthma Paternal Grandfather    Hypertension Daughter    Hypertension Son        pacemaker   Colon cancer Neg Hx    Esophageal cancer Neg Hx    Social History   Socioeconomic History   Marital status: Married    Spouse name: Jama   Number of children: 2   Years of education: Not on file   Highest education level: Not on file  Occupational History   Occupation: retired   Tobacco Use   Smoking status: Never   Smokeless tobacco: Never  Vaping Use   Vaping status: Never Used  Substance and Sexual Activity   Alcohol use: Not Currently   Drug use: Never   Sexual activity: Not Currently  Other Topics Concern   Not on file  Social History Narrative   Married   2 children (son and daughter) live locally   4 grandchildren   Retired   Used to work in Investment banker, 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   No pets   Enjoys spending time with grandchildren, baseball games, walking   Social Drivers of Health   Financial Resource Strain: Low Risk  (04/21/2024)   Overall Financial Resource Strain (CARDIA)    Difficulty of Paying Living Expenses: Not hard at all  Food Insecurity: No Food Insecurity (04/21/2024)   Hunger Vital Sign    Worried About Running Out of Food in the Last Year: Never true    Ran Out of Food in the Last Year: Never true  Transportation Needs: No Transportation Needs (04/21/2024)   PRAPARE - Administrator, Civil Service (Medical): No     Lack of Transportation (Non-Medical): No  Physical Activity: Inactive (04/21/2024)   Exercise Vital Sign    Days of Exercise per Week: 0 days    Minutes of Exercise per Session: 0 min  Stress: No Stress Concern Present (04/21/2024)   [employer] of Occupational Health - Occupational Stress Questionnaire    Feeling of Stress: Not at all  Social Connections: Socially Integrated (04/21/2024)   Social Connection and Isolation Panel    Frequency of Communication with Friends and Family: More  than three times a week    Frequency of Social Gatherings with Friends and Family: More than three times a week    Attends Religious Services: More than 4 times per year    Active Member of [employer] or Organizations: Yes    Attends Engineer, structural: More than 4 times per year    Marital Status: Married    Tobacco Counseling Counseling given: Not Answered    Clinical Intake:  Pre-visit preparation completed: Yes  Pain : No/denies pain     BMI - recorded: 31.11 Nutritional Status: BMI > 30  Obese Nutritional Risks: None Diabetes: No  Lab Results  Component Value Date   HGBA1C 5.5 07/03/2019     How often do you need to have someone help you when you read instructions, pamphlets, or other written materials from your doctor or pharmacy?: 1 - Never  Interpreter Needed?: No  Information entered by :: Rojelio Blush LPN   Activities of Daily Living      04/21/2024    1:55 PM  In your present state of health, do you have any difficulty performing the following activities:  Hearing? 0  Vision? 0  Difficulty concentrating or making decisions? 0  Walking or climbing stairs? 0  Dressing or bathing? 0  Doing errands, shopping? 0  Preparing Food and eating ? N  Using the Toilet? N  In the past six months, have you accidently leaked urine? N  Do you have problems with loss of bowel control? N  Managing your Medications? N  Managing your Finances? N  Housekeeping or  managing your Housekeeping? N    Patient Care Team: Daryl Setter, NP as PCP - General (Internal Medicine) Tobb, Kardie, DO as PCP - Cardiology (Cardiology)   I have updated your Care Teams any recent Medical Services you may have received from other providers in the past year.     Assessment:   This is a routine wellness examination for Matti.  Hearing/Vision screen Hearing Screening - Comments:: Denies hearing difficulties   Vision Screening - Comments:: Wears rx glasses - up to date with routine eye exams with  Deferred   Goals Addressed               This Visit's Progress     Increase physical activity (pt-stated)        Get more active.       Depression Screen      04/21/2024    1:55 PM 03/04/2023   10:27 AM 01/16/2023   10:14 AM 02/28/2022   10:24 AM 04/14/2021    1:57 PM 02/22/2021   10:29 AM 10/15/2019    2:03 PM  PHQ 2/9 Scores  PHQ - 2 Score 0 0 0 0 0 0 0  PHQ- 9 Score   0        Fall Risk      04/21/2024    1:55 PM 03/04/2023   10:25 AM 01/16/2023   10:14 AM 02/28/2022   10:24 AM 04/14/2021    1:57 PM  Fall Risk   Falls in the past year? 0 0 0 0 0  Number falls in past yr: 0 0 0 0 0  Injury with Fall? 0 0 0 0 0  Risk for fall due to : No Fall Risks No Fall Risks No Fall Risks No Fall Risks   Follow up Falls evaluation completed Falls evaluation completed Falls evaluation completed Falls evaluation completed       Data  saved with a previous flowsheet row definition    MEDICARE RISK AT HOME:   Medicare Risk at Home Any stairs in or around the home?: No If so, are there any without handrails?: No Home free of loose throw rugs in walkways, pet beds, electrical cords, etc?: Yes Adequate lighting in your home to reduce risk of falls?: Yes Life alert?: No Use of a cane, walker or w/c?: No Grab bars in the bathroom?: Yes Shower chair or bench in shower?: Yes Elevated toilet seat or a handicapped toilet?: No  TIMED UP AND GO:  Was the test  performed?  No  Cognitive Function: 6CIT completed        04/21/2024    1:56 PM 03/04/2023   10:28 AM 02/28/2022   10:30 AM  6CIT Screen  What Year? 0 points 0 points 0 points  What month? 0 points 0 points 0 points  What time? 0 points 0 points 0 points  Count back from 20 0 points 0 points 0 points  Months in reverse 0 points 0 points 0 points  Repeat phrase 4 points 0 points 0 points  Total Score 4 points 0 points 0 points    Immunizations Immunization History  Administered Date(s) Administered   Fluad Quad(high Dose 65+) 07/14/2019, 05/27/2020, 10/20/2021, 05/07/2022   Fluad Trivalent(High Dose 65+) 07/22/2023   PFIZER(Purple Top)SARS-COV-2 Vaccination 11/07/2019, 11/28/2019, 06/17/2020   PNEUMOCOCCAL CONJUGATE-20 10/20/2021   Pfizer Covid-19 Vaccine Bivalent Booster 35yrs & up 10/25/2021   Pfizer(Comirnaty )Fall Seasonal Vaccine 12 years and older 06/06/2022, 07/22/2023   Pneumococcal Polysaccharide-23 10/04/2020    Screening Tests Health Maintenance  Topic Date Due   DTaP/Tdap/Td (1 - Tdap) Never done   Zoster Vaccines- Shingrix  (1 of 2) Never done   COVID-19 Vaccine (7 - Pfizer risk 2024-25 season) 01/19/2024   INFLUENZA VACCINE  03/27/2024   Medicare Annual Wellness (AWV)  04/21/2025   MAMMOGRAM  05/05/2025   Colonoscopy  01/11/2030   Pneumococcal Vaccine: 50+ Years  Completed   DEXA SCAN  Completed   Hepatitis C Screening  Completed   HPV VACCINES  Aged Out   Meningococcal B Vaccine  Aged Out    Health Maintenance  Health Maintenance Due  Topic Date Due   DTaP/Tdap/Td (1 - Tdap) Never done   Zoster Vaccines- Shingrix  (1 of 2) Never done   COVID-19 Vaccine (7 - Pfizer risk 2024-25 season) 01/19/2024   INFLUENZA VACCINE  03/27/2024   Health Maintenance Items Addressed:   Additional Screening:  Vision Screening: Recommended annual ophthalmology exams for early detection of glaucoma and other disorders of the eye. Would you like a referral to an eye  doctor? No    Dental Screening: Recommended annual dental exams for proper oral hygiene  Community Resource Referral / Chronic Care Management: CRR required this visit?  No   CCM required this visit?  No   Plan:    I have personally reviewed and noted the following in the patient's chart:   Medical and social history Use of alcohol, tobacco or illicit drugs  Current medications and supplements including opioid prescriptions. Patient is not currently taking opioid prescriptions. Functional ability and status Nutritional status Physical activity Advanced directives List of other physicians Hospitalizations, surgeries, and ER visits in previous 12 months Vitals Screenings to include cognitive, depression, and falls Referrals and appointments  In addition, I have reviewed and discussed with patient certain preventive protocols, quality metrics, and best practice recommendations. A written personalized care plan for preventive services as  well as general preventive health recommendations were provided to patient.   Rojelio LELON Blush, LPN   [id number]   After Visit Summary: (MyChart) Due to this being a telephonic visit, the after visit summary with patients personalized plan was offered to patient via MyChart   Notes: Nothing significant to report at this time.

## 2024-05-07 ENCOUNTER — Ambulatory Visit (HOSPITAL_BASED_OUTPATIENT_CLINIC_OR_DEPARTMENT_OTHER)

## 2024-05-11 ENCOUNTER — Ambulatory Visit (INDEPENDENT_AMBULATORY_CARE_PROVIDER_SITE_OTHER)

## 2024-05-11 DIAGNOSIS — E538 Deficiency of other specified B group vitamins: Secondary | ICD-10-CM | POA: Diagnosis not present

## 2024-05-11 MED ORDER — CYANOCOBALAMIN 1000 MCG/ML IJ SOLN
1000.0000 ug | Freq: Once | INTRAMUSCULAR | Status: AC
  Administered 2024-05-11: 1000 ug via INTRAMUSCULAR

## 2024-05-11 NOTE — Progress Notes (Signed)
 Pt here for monthly B12 injection per original order dated: 07/22/23 Stable, continues monthly b12 shots. per Eleanor Ponto NP  Last B12 injection: 04/10/24  Last B12 level: 1,170  B12 1000mcg given IM, and pt tolerated injection well. Injection given in Right Deltoid IM.  Next B12 injection scheduled for: 06/11/24

## 2024-05-13 ENCOUNTER — Ambulatory Visit: Payer: Self-pay

## 2024-05-13 ENCOUNTER — Encounter: Payer: Self-pay | Admitting: Family Medicine

## 2024-05-13 ENCOUNTER — Ambulatory Visit (INDEPENDENT_AMBULATORY_CARE_PROVIDER_SITE_OTHER): Admitting: Family Medicine

## 2024-05-13 VITALS — BP 132/80 | HR 87 | Temp 98.0°F | Resp 16 | Ht 61.0 in | Wt 154.0 lb

## 2024-05-13 DIAGNOSIS — R432 Parageusia: Secondary | ICD-10-CM

## 2024-05-13 DIAGNOSIS — E0590 Thyrotoxicosis, unspecified without thyrotoxic crisis or storm: Secondary | ICD-10-CM

## 2024-05-13 MED ORDER — ALENDRONATE SODIUM 70 MG PO TABS: ORAL_TABLET | ORAL | 4 refills | Status: AC

## 2024-05-13 MED ORDER — METHIMAZOLE 5 MG PO TABS
5.0000 mg | ORAL_TABLET | Freq: Every day | ORAL | 3 refills | Status: AC
Start: 2024-05-13 — End: ?

## 2024-05-13 NOTE — Telephone Encounter (Signed)
 Pt has scheduled OV today 05/13/2024.

## 2024-05-13 NOTE — Patient Instructions (Addendum)
 Give us  2-3 business days to get the results of your labs back.   Consider Flonase daily to help with the taste. If we aren't turning the corner, let us  know and we can set you up with the ENT team.   If you do not hear anything about your referral in the next 1-2 weeks, call our office and ask for an update.  Let us  know if you need anything.

## 2024-05-13 NOTE — Telephone Encounter (Signed)
 FYI Only or Action Required?: FYI only for provider.  Patient was last seen in primary care on 02/05/2024 by Daryl Setter, NP.  Called Nurse Triage reporting Fatigue.  Symptoms began several weeks ago.  Interventions attempted: Nothing.  Symptoms are: gradually worsening.  Triage Disposition: See HCP Within 4 Hours (Or PCP Triage)  Patient/caregiver understands and will follow disposition?: Yes  Copied from CRM [id number]. Topic: Clinical - Red Word Triage >> May 13, 2024  9:53 AM Robinson H wrote: [facility] that prompted transfer to Nurse Triage: Not feeling well, extreme fatigue, feels really weak, can't really eat, vomited a little about a month and feels like it's getting worse, nauseous Reason for Disposition  [1] MODERATE weakness (e.g., interferes with work, school, normal activities) AND [2] cause unknown  (Exceptions: Weakness from acute minor illness or poor fluid intake; weakness is chronic and not worse.)  Answer Assessment - Initial Assessment Questions 1. DESCRIPTION: Describe how you are feeling.     I feel weak and nervous. I get tired easily and I feel nausea  2. SEVERITY: How bad is it?  Can you stand and walk?     Feels like I am getting  3. ONSET: When did these symptoms begin? (e.g., hours, days, weeks, months)     Almost a month  4. CAUSE: What do you think is causing the weakness or fatigue? (e.g., not drinking enough fluids, medical problem, trouble sleeping)     Unsure of cause, but food doesn't taste the same.   5. NEW MEDICINES:  Have you started on any new medicines recently? (e.g., opioid pain medicines, benzodiazepines, muscle relaxants, antidepressants, antihistamines, neuroleptics, beta blockers)     No  6. OTHER SYMPTOMS: Do you have any other symptoms? (e.g., chest pain, fever, cough, SOB, vomiting, diarrhea, bleeding, other areas of pain)     Nausea, poor appetite, dry cough off/on  Protocols used: Weakness (Generalized) and  Fatigue-A-AH

## 2024-05-13 NOTE — Progress Notes (Signed)
 Chief Complaint  Patient presents with   Fatigue    Fatigue     Subjective: Patient is a 73 y.o. female here for f/u.  She is here with her daughter.  Over the past several months, the patient has been losing weight unintentionally.  In June, she was diagnosed with hyperthyroidism and referred to endocrinology.  Nobody ever reached out and she never saw the specialist.  She is taking metoprolol  on a daily basis.  She is not on any other medication for hypothyroidism.   Over the last month, she started losing her taste.  Smell is unaffected.  No trauma to the face, recent illness, medication change, or tongue injury.  Past Medical History:  Diagnosis Date   Abnormal echocardiogram 07/22/2019   Acute deep vein thrombosis (DVT) of popliteal vein of right lower extremity (HCC) 07/01/2019   Chest pain 07/22/2019   Depressed left ventricular ejection fraction 07/22/2019   Diastolic dysfunction    DVT (deep venous thrombosis) (HCC) 07/01/2019   History of DVT (deep vein thrombosis) 07/01/2019   Hypertension    Hyperthyroidism    Lipid screening 07/22/2019   Obesity (BMI 30-39.9) 07/22/2019   Osteoporosis 11/18/2019   Single subsegmental pulmonary embolism without acute cor pulmonale (HCC) 07/01/2019   Thyroid  disease     Objective: BP 132/80 (BP Location: Left Arm, Patient Position: Sitting)   Pulse 87   Temp 98 F (36.7 C) (Oral)   Resp 16   Ht 5' 1 (1.549 m)   Wt 154 lb (69.9 kg)   SpO2 96%   BMI 29.10 kg/m  General: Awake, appears stated age Mouth: MMM, tongue is unremarkable Nose: Nares are patent without rhinorrhea, no sinus TTP Heart: RRR, no LE edema Lungs: CTAB, no rales, wheezes or rhonchi. No accessory muscle use Psych: Age appropriate judgment and insight, normal affect and mood  Assessment and Plan: Hyperthyroidism - Plan: methimazole  (TAPAZOLE ) 5 MG tablet, Ambulatory referral to Endocrinology  Loss of taste - Plan: CBC, Comprehensive metabolic panel with  GFR, Magnesium, B12  Chronic, not controlled.  Start methimazole  5 mg daily.  Refer to endocrinology again.  She will let us  know if she has not heard a thing in the next week or so.  Follow-up with PCP in 1 month to recheck this. Consider INCS.  Check above labs.  Could be related to #1? The patient and her daughter voiced understanding and agreement to the plan.  Mabel Mt Paige, DO 05/13/24  4:59 PM

## 2024-05-14 ENCOUNTER — Ambulatory Visit: Payer: Self-pay | Admitting: Family Medicine

## 2024-05-14 DIAGNOSIS — E0590 Thyrotoxicosis, unspecified without thyrotoxic crisis or storm: Secondary | ICD-10-CM

## 2024-05-14 DIAGNOSIS — I10 Essential (primary) hypertension: Secondary | ICD-10-CM

## 2024-05-14 LAB — CBC
HCT: 34.4 % — ABNORMAL LOW (ref 36.0–46.0)
Hemoglobin: 11.1 g/dL — ABNORMAL LOW (ref 12.0–15.0)
MCHC: 32.3 g/dL (ref 30.0–36.0)
MCV: 74.9 fl — ABNORMAL LOW (ref 78.0–100.0)
Platelets: 293 K/uL (ref 150.0–400.0)
RBC: 4.6 Mil/uL (ref 3.87–5.11)
RDW: 13.6 % (ref 11.5–15.5)
WBC: 5.7 K/uL (ref 4.0–10.5)

## 2024-05-14 LAB — COMPREHENSIVE METABOLIC PANEL WITH GFR
ALT: 16 U/L (ref 0–35)
AST: 22 U/L (ref 0–37)
Albumin: 3.7 g/dL (ref 3.5–5.2)
Alkaline Phosphatase: 107 U/L (ref 39–117)
BUN: 14 mg/dL (ref 6–23)
CO2: 31 meq/L (ref 19–32)
Calcium: 10.8 mg/dL — ABNORMAL HIGH (ref 8.4–10.5)
Chloride: 104 meq/L (ref 96–112)
Creatinine, Ser: 0.47 mg/dL (ref 0.40–1.20)
GFR: 93.78 mL/min (ref >60.00)
Glucose, Bld: 102 mg/dL — ABNORMAL HIGH (ref 70–99)
Potassium: 3 meq/L — ABNORMAL LOW (ref 3.5–5.1)
Sodium: 143 meq/L (ref 135–145)
Total Bilirubin: 0.7 mg/dL (ref 0.2–1.2)
Total Protein: 6.5 g/dL (ref 6.0–8.3)

## 2024-05-14 LAB — VITAMIN B12: Vitamin B-12: >1500 pg/mL — ABNORMAL HIGH (ref 211–911)

## 2024-05-14 LAB — MAGNESIUM: Magnesium: 1.5 mg/dL (ref 1.5–2.5)

## 2024-05-18 ENCOUNTER — Ambulatory Visit: Payer: Self-pay | Admitting: Family Medicine

## 2024-05-18 ENCOUNTER — Other Ambulatory Visit (INDEPENDENT_AMBULATORY_CARE_PROVIDER_SITE_OTHER)

## 2024-05-18 DIAGNOSIS — I10 Essential (primary) hypertension: Secondary | ICD-10-CM

## 2024-05-18 DIAGNOSIS — E0590 Thyrotoxicosis, unspecified without thyrotoxic crisis or storm: Secondary | ICD-10-CM | POA: Diagnosis not present

## 2024-05-18 LAB — COMPREHENSIVE METABOLIC PANEL WITH GFR
ALT: 17 U/L (ref 0–35)
AST: 20 U/L (ref 0–37)
Albumin: 3.7 g/dL (ref 3.5–5.2)
Alkaline Phosphatase: 107 U/L (ref 39–117)
BUN: 12 mg/dL (ref 6–23)
CO2: 32 meq/L (ref 19–32)
Calcium: 10.8 mg/dL — ABNORMAL HIGH (ref 8.4–10.5)
Chloride: 104 meq/L (ref 96–112)
Creatinine, Ser: 0.48 mg/dL (ref 0.40–1.20)
GFR: 93.3 mL/min (ref >60.00)
Glucose, Bld: 131 mg/dL — ABNORMAL HIGH (ref 70–99)
Potassium: 3.1 meq/L — ABNORMAL LOW (ref 3.5–5.1)
Sodium: 144 meq/L (ref 135–145)
Total Bilirubin: 0.8 mg/dL (ref 0.2–1.2)
Total Protein: 6.6 g/dL (ref 6.0–8.3)

## 2024-05-18 MED ORDER — POTASSIUM CHLORIDE ER 10 MEQ PO TBCR: 20.0000 meq | EXTENDED_RELEASE_TABLET | Freq: Every day | ORAL | 0 refills | Status: DC

## 2024-05-19 LAB — PARATHYROID HORMONE, INTACT (NO CA): PTH: 37 pg/mL (ref 16–77)

## 2024-05-21 ENCOUNTER — Ambulatory Visit (HOSPITAL_BASED_OUTPATIENT_CLINIC_OR_DEPARTMENT_OTHER)
Admission: RE | Admit: 2024-05-21 | Discharge: 2024-05-21 | Disposition: A | Discharge status: Home / Self Care | Source: Ambulatory Visit | Attending: Family | Admitting: Family

## 2024-05-21 ENCOUNTER — Encounter (HOSPITAL_BASED_OUTPATIENT_CLINIC_OR_DEPARTMENT_OTHER): Payer: Self-pay

## 2024-05-21 DIAGNOSIS — M810 Age-related osteoporosis without current pathological fracture: Secondary | ICD-10-CM | POA: Insufficient documentation

## 2024-05-21 DIAGNOSIS — Z1231 Encounter for screening mammogram for malignant neoplasm of breast: Secondary | ICD-10-CM | POA: Diagnosis not present

## 2024-05-21 DIAGNOSIS — Z780 Asymptomatic menopausal state: Secondary | ICD-10-CM | POA: Diagnosis not present

## 2024-05-26 ENCOUNTER — Ambulatory Visit: Payer: Self-pay | Admitting: Family

## 2024-06-02 ENCOUNTER — Other Ambulatory Visit (HOSPITAL_BASED_OUTPATIENT_CLINIC_OR_DEPARTMENT_OTHER): Payer: Self-pay

## 2024-06-02 ENCOUNTER — Encounter: Payer: Self-pay | Admitting: *Deleted

## 2024-06-02 ENCOUNTER — Telehealth: Payer: Self-pay | Admitting: Gastroenterology

## 2024-06-02 MED ORDER — ONDANSETRON HCL 4 MG PO TABS
4.0000 mg | ORAL_TABLET | Freq: Three times a day (TID) | ORAL | 0 refills | Status: DC | PRN
  Filled 2024-06-02: qty 5, 2d supply, fill #0

## 2024-06-02 NOTE — Telephone Encounter (Signed)
 Inbound call from patient stating she hasn't been able to keep prep medication down, continues to come right back up. Patient is scheduled for a double procedure 06/03/24 at 10 am Requesting a call back  Please advise  Thank you

## 2024-06-02 NOTE — Telephone Encounter (Signed)
 Returned the patient's call. She had started her prep ( Suprep) earlier then instructed this morning.She vomited within a few minutes of taking this. Reviewed the patient's prep instructions with her and her daughter. Advised the patient that since she already used 1 bottle of the suprep that she will need to purchase a 119 gm bottle of Miralax and 32 oz of gatorade to take for her second dose In the am. Sent a Rx for Zofran  4 mg PO for the patient to take 30 minutes prior to her Bottle of Suprep at 6 pm and the Miralax at 5 am. Advised the patient to notify the on call MD if she is unable to tolerate the Suprep again and if she isn't having the proper results from the prep. Pt verbalized understanding.

## 2024-06-03 ENCOUNTER — Ambulatory Visit: Admitting: Gastroenterology

## 2024-06-03 ENCOUNTER — Encounter: Payer: Self-pay | Admitting: Gastroenterology

## 2024-06-03 VITALS — BP 144/68 | HR 83 | Temp 98.2°F | Resp 16 | Ht 61.25 in | Wt 166.0 lb

## 2024-06-03 DIAGNOSIS — D509 Iron deficiency anemia, unspecified: Secondary | ICD-10-CM

## 2024-06-03 DIAGNOSIS — K644 Residual hemorrhoidal skin tags: Secondary | ICD-10-CM | POA: Diagnosis not present

## 2024-06-03 DIAGNOSIS — K31A0 Gastric intestinal metaplasia, unspecified: Secondary | ICD-10-CM

## 2024-06-03 DIAGNOSIS — K648 Other hemorrhoids: Secondary | ICD-10-CM

## 2024-06-03 DIAGNOSIS — K31A12 Gastric intestinal metaplasia without dysplasia, involving the body (corpus): Secondary | ICD-10-CM | POA: Diagnosis not present

## 2024-06-03 DIAGNOSIS — K31A13 Gastric intestinal metaplasia without dysplasia, involving the fundus: Secondary | ICD-10-CM | POA: Diagnosis not present

## 2024-06-03 DIAGNOSIS — E538 Deficiency of other specified B group vitamins: Secondary | ICD-10-CM

## 2024-06-03 DIAGNOSIS — K2941 Chronic atrophic gastritis with bleeding: Secondary | ICD-10-CM | POA: Diagnosis not present

## 2024-06-03 DIAGNOSIS — K2940 Chronic atrophic gastritis without bleeding: Secondary | ICD-10-CM | POA: Diagnosis not present

## 2024-06-03 DIAGNOSIS — K2970 Gastritis, unspecified, without bleeding: Secondary | ICD-10-CM

## 2024-06-03 DIAGNOSIS — K31A15 Gastric intestinal metaplasia without dysplasia, involving multiple sites: Secondary | ICD-10-CM

## 2024-06-03 DIAGNOSIS — K2951 Unspecified chronic gastritis with bleeding: Secondary | ICD-10-CM | POA: Diagnosis not present

## 2024-06-03 DIAGNOSIS — K219 Gastro-esophageal reflux disease without esophagitis: Secondary | ICD-10-CM | POA: Diagnosis not present

## 2024-06-03 DIAGNOSIS — R195 Other fecal abnormalities: Secondary | ICD-10-CM | POA: Diagnosis not present

## 2024-06-03 DIAGNOSIS — K3189 Other diseases of stomach and duodenum: Secondary | ICD-10-CM | POA: Diagnosis not present

## 2024-06-03 DIAGNOSIS — K641 Second degree hemorrhoids: Secondary | ICD-10-CM

## 2024-06-03 MED ORDER — SODIUM CHLORIDE 0.9 % IV SOLN: 500.0000 mL | INTRAVENOUS | Status: DC

## 2024-06-03 NOTE — Op Note (Addendum)
 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 The patient's tolerance of                            previous anesthesia was also reviewed. The risks                            and benefits of the procedure and the sedation                            options and risks were discussed with the patient.                            All questions were answered, and informed consent                            was obtained. Prior Anticoagulants: The patient has                            taken aspirin , last dose was 3 days prior to                            procedure. ASA Grade Assessment: II - A patient                            with mild systemic disease. After reviewing the                            risks and benefits, the patient was deemed in  satisfactory condition to undergo the procedure.                           After obtaining informed consent, the endoscope was                            passed under direct vision. Throughout the                            procedure, the patient's blood pressure, pulse, and                            oxygen saturations were monitored continuously. The                            Olympus Scope  [id number] was introduced through the                            mouth, and advanced to the second part of duodenum.                            The upper GI endoscopy was accomplished without                            difficulty. The patient tolerated the procedure                            well. Scope In: Scope Out: Findings:                 The examined esophagus was normal.                           Localized mild inflammation characterized by                            congestion (edema) and erythema was found in the                            gastric antrum. Biopsies were taken with a cold                            forceps for histology. Estimated blood loss was                            minimal.                           The gastroesophageal flap valve was visualized                            endoscopically and classified as Hill Grade III                            (minimal fold, loose to endoscope, hiatal hernia  likely).                           The gastric fundus and gastric body were normal.                            Biopsies were taken with cold forceps throughout                            the stomach per gastric intestinal mapping (GIM)                            protocol. Estimated blood loss was minimal.                           The examined duodenum was normal. Biopsies were                            taken with a cold forceps for histology. Estimated                            blood loss was minimal. Complications:            No immediate complications. Estimated Blood Loss:     Estimated blood loss was minimal. Impression:               - Normal esophagus.                           - Antral gastritis. Biopsied.                           - Gastroesophageal flap valve classified as Hill                            Grade III (minimal fold, loose to endoscope, hiatal                            hernia likely).                           -  Normal gastric fundus and gastric body. Biopsies                            were taken throughout the stomach per gastric                            intestinal mapping (GIM) protocol.                           - Normal examined duodenum. Biopsied. Recommendation:           - Patient has a contact number available for                            emergencies. The signs and symptoms of potential  delayed complications were discussed with the                            patient. Return to normal activities tomorrow.                            Written discharge instructions were provided to the                            patient.                           - Resume previous diet.                           - Continue present medications.                           - Await pathology results.                           - Perform a colonoscopy today. Sandor Flatter, MD 06/03/2024 11:03:16 AM

## 2024-06-03 NOTE — Progress Notes (Signed)
 Called to room to assist during endoscopic procedure.  Patient ID and intended procedure confirmed with present staff. Received instructions for my participation in the procedure from the performing physician.

## 2024-06-03 NOTE — Progress Notes (Signed)
 Pt's states no medical or surgical changes since previsit or office visit.

## 2024-06-03 NOTE — Patient Instructions (Addendum)
 Handouts given: Hemorrhoids Resume previous diet. Continue present medications.  Await pathology results. Repeat colonoscopy is not recommended due to current age.  YOU HAD AN ENDOSCOPIC PROCEDURE TODAY AT THE [facility]:   Refer to the procedure report that was given to you for any specific questions about what was found during the examination.  If the procedure report does not answer your questions, please call your gastroenterologist to clarify.  If you requested that your care partner not be given the details of your procedure findings, then the procedure report has been included in a sealed envelope for you to review at your convenience later.  YOU SHOULD EXPECT: Some feelings of bloating in the abdomen. Passage of more gas than usual.  Walking can help get rid of the air that was put into your GI tract during the procedure and reduce the bloating. If you had a lower endoscopy (such as a colonoscopy or flexible sigmoidoscopy) you may notice spotting of blood in your stool or on the toilet paper. If you underwent a bowel prep for your procedure, you may not have a normal bowel movement for a few days.  Please Note:  You might notice some irritation and congestion in your nose or some drainage.  This is from the oxygen used during your procedure.  There is no need for concern and it should clear up in a day or so.  SYMPTOMS TO REPORT IMMEDIATELY:  Following lower endoscopy (colonoscopy or flexible sigmoidoscopy):  Excessive amounts of blood in the stool  Significant tenderness or worsening of abdominal pains  Swelling of the abdomen that is new, acute  Fever of 100F or higher  Following upper endoscopy (EGD)  Vomiting of blood or coffee ground material  New chest pain or pain under the shoulder blades  Painful or persistently difficult swallowing  New shortness of breath  Fever of 100F or higher  Black, tarry-looking stools  For urgent or emergent issues, a  gastroenterologist can be reached at any hour by calling [phone]. Do not use MyChart messaging for urgent concerns.    DIET:  We do recommend a small meal at first, but then you may proceed to your regular diet.  Drink plenty of fluids but you should avoid alcoholic beverages for 24 hours.  ACTIVITY:  You should plan to take it easy for the rest of today and you should NOT DRIVE or use heavy machinery until tomorrow (because of the sedation medicines used during the test).    FOLLOW UP: Our staff will call the number listed on your records the next business day following your procedure.  We will call around 7:15- 8:00 am to check on you and address any questions or concerns that you may have regarding the information given to you following your procedure. If we do not reach you, we will leave a message.     If any biopsies were taken you will be contacted by phone or by letter within the next 1-3 weeks.  Please call us  at [phone] if you have not heard about the biopsies in 3 weeks.    SIGNATURES/CONFIDENTIALITY: You and/or your care partner have signed paperwork which will be entered into your electronic medical record.  These signatures attest to the fact that that the information above on your After Visit Summary has been reviewed and is understood.  Full responsibility of the confidentiality of this discharge information lies with you and/or your care-partner.

## 2024-06-03 NOTE — Progress Notes (Signed)
 Report to PACU, RN, vss, BBS= Clear.

## 2024-06-03 NOTE — Op Note (Addendum)
 [facility] Patient Name: Ruth Jensen Procedure Date: 06/03/2024 10:18 AM MRN: [id number] Endoscopist: Sandor Flatter , MD, [id number] Age: 73 Referring MD:  Date of Birth: [birth year] Gender: Female Account #: [id number] Procedure:                Colonoscopy Indications:              Heme positive stool Medicines:                Monitored Anesthesia Care Procedure:                Pre-Anesthesia Assessment:                           - Prior to the procedure, a History and Physical                            was performed, and patient medications and                            allergies were reviewed. The patient's tolerance of                            previous anesthesia was also reviewed. The risks                            and benefits of the procedure and the sedation                            options and risks were discussed with the patient.                            All questions were answered, and informed consent                            was obtained. Prior Anticoagulants: The patient has                            taken aspirin , last dose was 3 days prior to                            procedure. ASA Grade Assessment: II - A patient                            with mild systemic disease. After reviewing the                            risks and benefits, the patient was deemed in                            satisfactory condition to undergo the procedure.                           After obtaining informed consent, the colonoscope  was passed under direct vision. Throughout the                            procedure, the patient's blood pressure, pulse, and                            oxygen saturations were monitored continuously. The                            CF [id number] #[id number] was introduced through the anus                            and advanced to the the cecum, identified by                            appendiceal orifice and  ileocecal valve. The                            colonoscopy was performed without difficulty. The                            patient tolerated the procedure well. The quality                            of the bowel preparation was good. The ileocecal                            valve, appendiceal orifice, and rectum were                            photographed. Scope In: 10:40:54 AM Scope Out: 10:53:53 AM Scope Withdrawal Time: 0 hours 7 minutes 5 seconds  Total Procedure Duration: 0 hours 12 minutes 59 seconds  Findings:                 Skin tags were found on perianal exam.                           The entire colon appeared normal.                           Non-bleeding internal hemorrhoids were found during                            retroflexion. The hemorrhoids were small. Complications:            No immediate complications. Estimated Blood Loss:     Estimated blood loss: none. Impression:               - Perianal skin tags found on perianal exam.                           - The entire examined colon is normal.                           - Non-bleeding internal hemorrhoids.                           -  No specimens collected. Recommendation:           - Patient has a contact number available for                            emergencies. The signs and symptoms of potential                            delayed complications were discussed with the                            patient. Return to normal activities tomorrow.                            Written discharge instructions were provided to the                            patient.                           - Resume previous diet.                           - Continue present medications.                           - Resume aspirin  at prior dose tomorrow.                           - Repeat colonoscopy is not recommended due to                            current age (36 years or older) for screening                            purposes.                            - Return to GI clinic PRN. Sandor Flatter, MD 06/03/2024 11:06:31 AM

## 2024-06-03 NOTE — Progress Notes (Signed)
 GASTROENTEROLOGY PROCEDURE H&P NOTE   Primary Care Physician: Daryl Setter, NP    Reason for Procedure:  Heme positive stool, iron  deficiency anemia, B12 deficiency anemia, history of gastritis with intestinal metaplasia, fatigue, GERD  Plan:    EGD, colonoscopy  Patient is appropriate for endoscopic procedure(s) in the ambulatory ([facility]) setting.  The nature of the procedure, as well as the risks, benefits, and alternatives were carefully and thoroughly reviewed with the patient. Ample time for discussion and questions allowed. The patient understood, was satisfied, and agreed to proceed.     HPI: Ruth Jensen is a 73 y.o. female who presents for EGD and colonoscopy for evaluation of heme positive stool, history of iron  deficiency and B12 deficiency anemia (with good response to iron  and B12 supplement).  Additionally, EGD for GIM mapping.  Reflux otherwise well-controlled on daily PPI.  Has been holding Xarelto  for procedures today.  Otherwise no significant changes in clinical history since last office visit on 04/17/2024.  Past Medical History:  Diagnosis Date   Abnormal echocardiogram 07/22/2019   Acute deep vein thrombosis (DVT) of popliteal vein of right lower extremity (HCC) 07/01/2019   Arthritis    Chest pain 07/22/2019   Depressed left ventricular ejection fraction 07/22/2019   Diastolic dysfunction    DVT (deep venous thrombosis) (HCC) 07/01/2019   GERD (gastroesophageal reflux disease)    History of DVT (deep vein thrombosis) 07/01/2019   Hypertension    Hyperthyroidism    Lipid screening 07/22/2019   Obesity (BMI 30-39.9) 07/22/2019   Osteoporosis 11/18/2019   Single subsegmental pulmonary embolism without acute cor pulmonale (HCC) 07/01/2019   Thyroid  disease     Past Surgical History:  Procedure Laterality Date   ABDOMINAL HYSTERECTOMY  2010   due to fibroids   BREAST BIOPSY Left    BREAST CYST EXCISION Left    COLONOSCOPY     [facility]  GI. Late 2000's    LEFT HEART CATH AND CORONARY ANGIOGRAPHY N/A 08/03/2019   Procedure: LEFT HEART CATH AND CORONARY ANGIOGRAPHY;  Surgeon: Court Dorn PARAS, MD;  Location: [facility] INVASIVE CV LAB;  Service: Cardiovascular;  Laterality: N/A;   mitral valve regurg  2023   moderat per 2D echo   OOPHORECTOMY      Prior to Admission medications   Medication Sig Start Date End Date Taking? Authorizing Provider  aspirin  EC 81 MG tablet Take 2 tablets (162 mg total) by mouth daily. Swallow whole. 01/16/23  Yes O'Sullivan, Melissa, NP  atorvastatin  (LIPITOR) 40 MG tablet TAKE 1 TABLET(40 MG) BY MOUTH DAILY 04/04/24  Yes O'Sullivan, Melissa, NP  Cholecalciferol (VITAMIN D3) 10 MCG (400 UNIT) CAPS Take by mouth.   Yes [provider]  Iron , Ferrous Sulfate , 325 (65 Fe) MG TABS Take 325 mg by mouth every other day. 05/08/22  Yes O'Sullivan, Melissa, NP  lisinopril  (ZESTRIL ) 5 MG tablet Take 1 tablet (5 mg total) by mouth daily. 04/08/24  Yes Tobb, Kardie, DO  methimazole  (TAPAZOLE ) 5 MG tablet Take 1 tablet (5 mg total) by mouth daily. 05/13/24  Yes Frann Mabel Mt, DO  metoprolol  succinate (TOPROL -XL) 50 MG 24 hr tablet TAKE 1 TABLET(50 MG) BY MOUTH DAILY WITH OR IMMEDIATELY FOLLOWING A MEAL 10/10/23  Yes Daryl Setter, NP  omeprazole  (PRILOSEC) 40 MG capsule TAKE 1 CAPSULE(40 MG) BY MOUTH DAILY 04/08/24  Yes O'Sullivan, Melissa, NP  ondansetron  (ZOFRAN ) 4 MG tablet Take 1 tablet (4 mg total) by mouth every 8 (eight) hours as needed for nausea  or vomiting. Take 1 tablet 30 minutes prior to your prep. 06/02/24  Yes Sameena Artus V, DO  potassium chloride  (KLOR-CON  10) 10 MEQ tablet Take 2 tablets (20 mEq total) by mouth daily for 7 days. 05/18/24 06/03/24 Yes Wendling, Mabel Mt, DO  alendronate  (FOSAMAX ) 70 MG tablet TAKE 1 TABLET(70 MG) BY MOUTH EVERY 7 DAYS WITH A FULL GLASS OF WATER AND ON AN EMPTY STOMACH 05/13/24   Wendling, Mabel Mt, DO  nitroGLYCERIN  (NITROSTAT ) 0.4 MG SL tablet  Place 1 tablet (0.4 mg total) under the tongue every 5 (five) minutes as needed. 01/16/23 04/17/24  O'Sullivan, Melissa, NP    Current Outpatient Medications  Medication Sig Dispense Refill   aspirin  EC 81 MG tablet Take 2 tablets (162 mg total) by mouth daily. Swallow whole. 30 tablet 12   atorvastatin  (LIPITOR) 40 MG tablet TAKE 1 TABLET(40 MG) BY MOUTH DAILY 90 tablet 1   Cholecalciferol (VITAMIN D3) 10 MCG (400 UNIT) CAPS Take by mouth.     Iron , Ferrous Sulfate , 325 (65 Fe) MG TABS Take 325 mg by mouth every other day. 30 tablet    lisinopril  (ZESTRIL ) 5 MG tablet Take 1 tablet (5 mg total) by mouth daily. 90 tablet 0   methimazole  (TAPAZOLE ) 5 MG tablet Take 1 tablet (5 mg total) by mouth daily. 30 tablet 3   metoprolol  succinate (TOPROL -XL) 50 MG 24 hr tablet TAKE 1 TABLET(50 MG) BY MOUTH DAILY WITH OR IMMEDIATELY FOLLOWING A MEAL 90 tablet 1   omeprazole  (PRILOSEC) 40 MG capsule TAKE 1 CAPSULE(40 MG) BY MOUTH DAILY 90 capsule 0   ondansetron  (ZOFRAN ) 4 MG tablet Take 1 tablet (4 mg total) by mouth every 8 (eight) hours as needed for nausea or vomiting. Take 1 tablet 30 minutes prior to your prep. 5 tablet 0   potassium chloride  (KLOR-CON  10) 10 MEQ tablet Take 2 tablets (20 mEq total) by mouth daily for 7 days. 14 tablet 0   alendronate  (FOSAMAX ) 70 MG tablet TAKE 1 TABLET(70 MG) BY MOUTH EVERY 7 DAYS WITH A FULL GLASS OF WATER AND ON AN EMPTY STOMACH 12 tablet 4   nitroGLYCERIN  (NITROSTAT ) 0.4 MG SL tablet Place 1 tablet (0.4 mg total) under the tongue every 5 (five) minutes as needed. 30 tablet 0   Current Facility-Administered Medications  Medication Dose Route Frequency Provider Last Rate Last Admin   0.9 %  sodium chloride  infusion  500 mL Intravenous Continuous Lyn Joens V, DO        Allergies as of 06/03/2024   (No Known Allergies)    Family History  Problem Relation Age of Onset   Hypertension Mother    CVA Mother        hemiparesis   Hypertension Father     Dementia Father    Prostate cancer Father    Hypertension Sister    CAD Sister        scheduled for pacemaker   Hypertension Sister    Skin cancer Sister        melanoma died at age 77   Hypertension Sister        ? PFO repair   Parkinson's disease Sister    Asthma Paternal Grandfather    Hypertension Daughter    Hypertension Son        pacemaker   Colon cancer Neg Hx    Esophageal cancer Neg Hx    Rectal cancer Neg Hx    Stomach cancer Neg Hx     Social History  Socioeconomic History   Marital status: Married    Spouse name: Jama   Number of children: 2   Years of education: Not on file   Highest education level: Not on file  Occupational History   Occupation: retired   Tobacco Use   Smoking status: Never   Smokeless tobacco: Never  Vaping Use   Vaping status: Never Used  Substance and Sexual Activity   Alcohol use: Not Currently   Drug use: Never   Sexual activity: Not Currently  Other Topics Concern   Not on file  Social History Narrative   Married   2 children (son and daughter) live locally   4 grandchildren   Retired   Used to work in Investment banker, 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   No pets   Enjoys spending time with grandchildren, baseball games, walking   Social Drivers of Health   Financial Resource Strain: Low Risk  (04/21/2024)   Overall Financial Resource Strain (CARDIA)    Difficulty of Paying Living Expenses: Not hard at all  Food Insecurity: No Food Insecurity (04/21/2024)   Hunger Vital Sign    Worried About Running Out of Food in the Last Year: Never true    Ran Out of Food in the Last Year: Never true  Transportation Needs: No Transportation Needs (04/21/2024)   PRAPARE - Administrator, Civil Service (Medical): No    Lack of Transportation (Non-Medical): No  Physical Activity: Inactive (04/21/2024)   Exercise Vital Sign    Days of Exercise per Week: 0 days    Minutes of Exercise per Session: 0 min  Stress: No Stress Concern Present  (04/21/2024)   [employer] of Occupational Health - Occupational Stress Questionnaire    Feeling of Stress: Not at all  Social Connections: Socially Integrated (04/21/2024)   Social Connection and Isolation Panel    Frequency of Communication with Friends and Family: More than three times a week    Frequency of Social Gatherings with Friends and Family: More than three times a week    Attends Religious Services: More than 4 times per year    Active Member of [employer] or Organizations: Yes    Attends Engineer, structural: More than 4 times per year    Marital Status: Married  Catering manager Violence: Not At Risk (04/21/2024)   Humiliation, Afraid, Rape, and Kick questionnaire    Fear of Current or Ex-Partner: No    Emotionally Abused: No    Physically Abused: No    Sexually Abused: No    Physical Exam: Vital signs in last 24 hours: @BP  (!) 166/90   Pulse 87   Temp 98.2 F (36.8 C)   Ht 5' 1.25 (1.556 m)   Wt 166 lb (75.3 kg)   SpO2 96%   BMI 31.11 kg/m  GEN: NAD EYE: Sclerae anicteric ENT: MMM CV: Non-tachycardic Pulm: CTA b/l GI: Soft, NT/ND NEURO:  Alert & Oriented x 3   Sandor Flatter, DO [facility]   06/03/2024 10:17 AM

## 2024-06-04 ENCOUNTER — Telehealth: Payer: Self-pay

## 2024-06-04 NOTE — Telephone Encounter (Signed)
 No answer on follow-up call. Left VM for pt.

## 2024-06-05 LAB — SURGICAL PATHOLOGY

## 2024-06-08 ENCOUNTER — Ambulatory Visit: Payer: Self-pay | Admitting: Gastroenterology

## 2024-06-11 ENCOUNTER — Ambulatory Visit (INDEPENDENT_AMBULATORY_CARE_PROVIDER_SITE_OTHER)

## 2024-06-11 DIAGNOSIS — E538 Deficiency of other specified B group vitamins: Secondary | ICD-10-CM

## 2024-06-11 MED ORDER — CYANOCOBALAMIN 1000 MCG/ML IJ SOLN
1000.0000 ug | Freq: Once | INTRAMUSCULAR | Status: AC
  Administered 2024-06-11: 1000 ug via INTRAMUSCULAR

## 2024-06-11 NOTE — Progress Notes (Signed)
 Pt here today for monthly B12 injection per Melissa.  Cyanocobalamin  103mcg/mL 1 mL injected into L deltoid. Pt tolerated injection well.   Next in 1 month

## 2024-06-23 ENCOUNTER — Ambulatory Visit: Admitting: Cardiology

## 2024-07-04 ENCOUNTER — Other Ambulatory Visit: Payer: Self-pay | Admitting: Cardiology

## 2024-07-04 ENCOUNTER — Other Ambulatory Visit: Payer: Self-pay | Admitting: Family

## 2024-07-04 DIAGNOSIS — K219 Gastro-esophageal reflux disease without esophagitis: Secondary | ICD-10-CM

## 2024-07-07 ENCOUNTER — Other Ambulatory Visit: Payer: Self-pay | Admitting: Cardiology

## 2024-07-08 ENCOUNTER — Telehealth: Payer: Self-pay | Admitting: Cardiology

## 2024-07-08 MED ORDER — LISINOPRIL 5 MG PO TABS: 5.0000 mg | ORAL_TABLET | Freq: Every day | ORAL | 0 refills | Status: DC

## 2024-07-08 NOTE — Telephone Encounter (Signed)
*  STAT* If patient is at the pharmacy, call can be transferred to refill team.   1. Which medications need to be refilled? (please list name of each medication and dose if known)   lisinopril  (ZESTRIL ) 5 MG tablet   2. Would you like to learn more about the convenience, safety, & potential cost savings by using the [facility]?   3. Are you open to using the [facility] (Type [facility]. ).  4. Which pharmacy/location (including street and city if local pharmacy) is medication to be sent to?  [id number] - [place], [place] - [address] AT [facility] [address]   5. Do they need a 30 day or 90 day supply?   Patient stated she has 5 tablets left. Patient has appointment scheduled with Dr. Sheena on 12/4.

## 2024-07-09 ENCOUNTER — Ambulatory Visit (INDEPENDENT_AMBULATORY_CARE_PROVIDER_SITE_OTHER)

## 2024-07-09 DIAGNOSIS — E538 Deficiency of other specified B group vitamins: Secondary | ICD-10-CM

## 2024-07-09 MED ORDER — CYANOCOBALAMIN 1000 MCG/ML IJ SOLN
1000.0000 ug | Freq: Once | INTRAMUSCULAR | Status: AC
  Administered 2024-07-09: 1000 ug via INTRAMUSCULAR

## 2024-07-09 NOTE — Progress Notes (Signed)
 Pt here for monthly B12 injection per original order dated: 07/22/23 Stable, continues monthly b12 shots. per Eleanor Ponto NP    Last B12 injection:06/11/24  Last B12 level:  1,500 on 05/13/24   B12 1000mcg given IM, and pt tolerated injection well.  Next B12 injection scheduled for: 08/07/24

## 2024-07-20 ENCOUNTER — Encounter: Payer: Self-pay | Admitting: Pharmacist

## 2024-07-20 NOTE — Progress Notes (Signed)
 Pharmacy Quality Measure Review  This patient is appearing on a report for being at risk of failing the adherence measure for hypertension (ACEi/ARB) medications this calendar year.   Medication: lisinopril   Last fill date: 04/08/2024 for 90 day supply  Reviewed recent refill history in Dr Annemarie database. Actual last refill date was 07/08/2024 for 90 day supply. Patient has no refills remaining. Next appointment with Dr Sheena /cardiologist is 07/2024.    Insurance report was not up to date. No action needed at this time.   Madelin Ray, PharmD Clinical Pharmacist [facility]  Population Health [phone]

## 2024-07-27 ENCOUNTER — Other Ambulatory Visit (HOSPITAL_BASED_OUTPATIENT_CLINIC_OR_DEPARTMENT_OTHER): Payer: Self-pay

## 2024-07-27 ENCOUNTER — Ambulatory Visit: Admitting: Family

## 2024-07-27 ENCOUNTER — Ambulatory Visit: Payer: Self-pay

## 2024-07-27 ENCOUNTER — Other Ambulatory Visit: Payer: Self-pay

## 2024-07-27 VITALS — BP 126/66 | HR 113 | Temp 98.6°F | Resp 16 | Ht 61.5 in | Wt 138.0 lb

## 2024-07-27 DIAGNOSIS — R112 Nausea with vomiting, unspecified: Secondary | ICD-10-CM

## 2024-07-27 DIAGNOSIS — I10 Essential (primary) hypertension: Secondary | ICD-10-CM

## 2024-07-27 LAB — CBC WITH DIFFERENTIAL/PLATELET
Absolute Lymphocytes: 1153 {cells}/uL (ref 850–3900)
Absolute Monocytes: 519 {cells}/uL (ref 200–950)
Basophils Absolute: 12 {cells}/uL (ref 0–200)
Basophils Relative: 0.2 %
Eosinophils Absolute: 49 {cells}/uL (ref 15–500)
Eosinophils Relative: 0.8 %
HCT: 37.5 % (ref 35.9–46.0)
Hemoglobin: 11.7 g/dL (ref 11.7–15.5)
MCH: 24.6 pg — ABNORMAL LOW (ref 27.0–33.0)
MCHC: 31.2 g/dL — ABNORMAL LOW (ref 31.6–35.4)
MCV: 78.8 fL — ABNORMAL LOW (ref 81.4–101.7)
MPV: 11.5 fL (ref 7.5–12.5)
Monocytes Relative: 8.5 %
Neutro Abs: 4368 {cells}/uL (ref 1500–7800)
Neutrophils Relative %: 71.6 %
Platelets: 285 Thousand/uL (ref 140–400)
RBC: 4.76 Million/uL (ref 3.80–5.10)
RDW: 14 % (ref 11.0–15.0)
Total Lymphocyte: 18.9 %
WBC: 6.1 Thousand/uL (ref 3.8–10.8)

## 2024-07-27 LAB — COMPREHENSIVE METABOLIC PANEL WITH GFR
ALT: 19 U/L (ref 0–35)
AST: 28 U/L (ref 0–37)
Albumin: 3.8 g/dL (ref 3.5–5.2)
Alkaline Phosphatase: 95 U/L (ref 39–117)
BUN: 12 mg/dL (ref 6–23)
CO2: 37 meq/L — ABNORMAL HIGH (ref 19–32)
Calcium: 11.1 mg/dL — ABNORMAL HIGH (ref 8.4–10.5)
Chloride: 98 meq/L (ref 96–112)
Creatinine, Ser: 0.4 mg/dL (ref 0.40–1.20)
GFR: 97.36 mL/min (ref >60.00)
Glucose, Bld: 93 mg/dL (ref 70–99)
Potassium: 3.7 meq/L (ref 3.5–5.1)
Sodium: 142 meq/L (ref 135–145)
Total Bilirubin: 1.2 mg/dL (ref 0.2–1.2)
Total Protein: 6.7 g/dL (ref 6.0–8.3)

## 2024-07-27 LAB — LIPASE: Lipase: 7 U/L — ABNORMAL LOW (ref 11.0–59.0)

## 2024-07-27 MED ORDER — METOPROLOL SUCCINATE ER 50 MG PO TB24: 50.0000 mg | ORAL_TABLET | Freq: Every day | ORAL | 1 refills | Status: DC

## 2024-07-27 MED ORDER — ONDANSETRON HCL 4 MG PO TABS
4.0000 mg | ORAL_TABLET | Freq: Three times a day (TID) | ORAL | 0 refills | Status: AC | PRN
  Filled 2024-07-27: qty 20, 7d supply, fill #0

## 2024-07-27 MED ORDER — POTASSIUM CHLORIDE ER 10 MEQ PO TBCR: 20.0000 meq | EXTENDED_RELEASE_TABLET | Freq: Every day | ORAL | 0 refills | Status: DC

## 2024-07-27 NOTE — Telephone Encounter (Signed)
 FYI Only or Action Required?: FYI only for provider: appointment scheduled on 07/27/2024.  Patient was last seen in primary care on 05/13/2024 by Frann Mabel Mt, DO.  Called Nurse Triage reporting Vomiting.  Symptoms began a week ago.  Interventions attempted: Rest, hydration, or home remedies.  Symptoms are: unchanged.  Triage Disposition: See Physician Within 24 Hours  Patient/caregiver understands and will follow disposition?: Yes  Copied from CRM #[id number]. Topic: Clinical - Red Word Triage >> Jul 27, 2024 10:23 AM Zebedee SAUNDERS wrote: Red Word that prompted transfer to Nurse Triage: Pt stated she has been unable to keep food down, vomiting for a week. No fever or chills. Reason for Disposition  [1] MILD or MODERATE vomiting AND [2] present > 48 hours (2 days)  (Exception: Mild vomiting with associated diarrhea.)  Answer Assessment - Initial Assessment Questions Pt called in stating that she has been vomiting when trying to eat food x 1 week. She states she feels nauseated and is unable to keep food down. She denies any other symptoms and states that she has been rapidly losing weight. Appointment scheduled for evaluation. Patient agrees with plan of care, and will call back if anything changes, or if symptoms worsen.    1. VOMITING SEVERITY: How many times have you vomited in the past 24 hours?      More nausea; vomiting when attempting to eat/drink  2. ONSET: When did the vomiting begin?      Over a week  3. FLUIDS: What fluids or food have you vomited up today? Have you been able to keep any fluids down?     Able to drink chicken noodle broth  4. ABDOMEN PAIN: Are your having any abdomen pain? If Yes : How bad is it and what does it feel like? (e.g., crampy, dull, intermittent, constant)      No pain or bloating; pt reports having a sick feeling on my stomach.   5. DIARRHEA: Is there any diarrhea? If Yes, ask: How many times today?      No; pt reports  intermittent constipation. Taking miralax  8. HYDRATION STATUS: Any signs of dehydration? (e.g., dry mouth [not only dry lips], too weak to stand) When did you last urinate?     None; pt reports no issues with urination   9. OTHER SYMPTOMS: Do you have any other symptoms? (e.g., fever, headache, vertigo, vomiting blood or coffee grounds, recent head injury)     None  Protocols used: Vomiting-A-AH

## 2024-07-27 NOTE — Patient Instructions (Signed)
  VISIT SUMMARY: You came in today because you have been experiencing weakness and vomiting for the past couple of months. You mentioned that eating makes your nausea worse and often leads to vomiting, especially after meals. You are able to tolerate liquids but have difficulty keeping solid foods down. You have been using Zofran  for nausea, which has helped, but it affects your sleep.  YOUR PLAN: -NAUSEA AND VOMITING: Nausea and vomiting can be caused by various conditions and can be exacerbated by eating solid foods. We have ordered an abdominal CT scan to check your stomach and gallbladder, and baseline labs to evaluate your kidney function. You have been prescribed Zofran  to help manage your nausea. Additionally, we recommend drinking Ensure to help with your nutritional intake. If you develop abdominal pain or are unable to keep down liquids, please visit the ER immediately.  INSTRUCTIONS: Please follow up in two weeks for a re-evaluation. Make sure to complete the abdominal CT scan and baseline labs before your next visit.

## 2024-07-27 NOTE — Telephone Encounter (Signed)
 Pt was seen in office today at 11:40 AM.    Copied from CRM #[id number]. Topic: Clinical - Red Word Triage >> Jul 27, 2024 10:02 AM Corin V wrote: [facility] that prompted transfer to Nurse Triage: Patient is having weakness and anxiety, she cannot eat and is having trouble swallowing food.

## 2024-07-27 NOTE — Telephone Encounter (Signed)
 Appt scheduled

## 2024-07-27 NOTE — Progress Notes (Signed)
 Subjective:     Patient ID: Ruth Jensen, female    DOB: 1951/06/20, 73 y.o.   MRN: [id number]  Chief Complaint  Patient presents with   Emesis    Patient reports vomiting after eating, for a few months   Fatigue    Patient complains of feeling very weak     HPI  Discussed the use of AI scribe software for clinical note transcription with the patient, who gave verbal consent to proceed.  History of Present Illness Ruth Jensen is a 73 year old female who presents with weakness and vomiting.  She has been experiencing nausea and vomiting for the past couple of months, with eating exacerbating her nausea and often leading to postprandial vomiting. She describes her current state as 'as nauseated as I can be.'  She has difficulty keeping solid foods down but is able to tolerate liquids. Her food intake is minimal due to her symptoms. No abdominal pain is reported. Bowel movements occur every day or every other day, but they are smaller and less firm.  She has previously used Zofran  for nausea, which she found helpful, although it affected her sleep. She had one dose left from a prior colonoscopy and used it recently.  She confirms difficulty keeping solid foods down but can tolerate liquids.      Health Maintenance Due  Topic Date Due   DTaP/Tdap/Td (1 - Tdap) Never done   Zoster Vaccines- Shingrix  (1 of 2) Never done   Influenza Vaccine  03/27/2024   COVID-19 Vaccine (7 - 2025-26 season) 04/27/2024    Past Medical History:  Diagnosis Date   Abnormal echocardiogram 07/22/2019   Acute deep vein thrombosis (DVT) of popliteal vein of right lower extremity (HCC) 07/01/2019   Arthritis    Chest pain 07/22/2019   Depressed left ventricular ejection fraction 07/22/2019   Diastolic dysfunction    DVT (deep venous thrombosis) (HCC) 07/01/2019   GERD (gastroesophageal reflux disease)    History of DVT (deep vein thrombosis) 07/01/2019   Hypertension    Hyperthyroidism     Lipid screening 07/22/2019   Obesity (BMI 30-39.9) 07/22/2019   Osteoporosis 11/18/2019   Single subsegmental pulmonary embolism without acute cor pulmonale (HCC) 07/01/2019   Thyroid  disease     Past Surgical History:  Procedure Laterality Date   ABDOMINAL HYSTERECTOMY  2010   due to fibroids   BREAST BIOPSY Left    BREAST CYST EXCISION Left    COLONOSCOPY     [facility]. Late 2000's    LEFT HEART CATH AND CORONARY ANGIOGRAPHY N/A 08/03/2019   Procedure: LEFT HEART CATH AND CORONARY ANGIOGRAPHY;  Surgeon: Court Dorn PARAS, MD;  Location: [facility];  Service: Cardiovascular;  Laterality: N/A;   mitral valve regurg  2023   moderat per 2D echo   OOPHORECTOMY      Family History  Problem Relation Age of Onset   Hypertension Mother    CVA Mother        hemiparesis   Hypertension Father    Dementia Father    Prostate cancer Father    Hypertension Sister    CAD Sister        scheduled for pacemaker   Hypertension Sister    Skin cancer Sister        melanoma died at age 74   Hypertension Sister        ? PFO repair   Parkinson's disease Sister    Asthma Paternal Grandfather  Hypertension Daughter    Hypertension Son        pacemaker   Colon cancer Neg Hx    Esophageal cancer Neg Hx    Rectal cancer Neg Hx    Stomach cancer Neg Hx     Social History   Socioeconomic History   Marital status: Married    Spouse name: Jama   Number of children: 2   Years of education: Not on file   Highest education level: Not on file  Occupational History   Occupation: retired   Tobacco Use   Smoking status: Never   Smokeless tobacco: Never  Vaping Use   Vaping status: Never Used  Substance and Sexual Activity   Alcohol use: Not Currently   Drug use: Never   Sexual activity: Not Currently  Other Topics Concern   Not on file  Social History Narrative   Married   2 children (son and daughter) live locally   4 grandchildren   Retired   Used to work in  investment banker, 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   No pets   Enjoys spending time with grandchildren, baseball games, walking   Social Drivers of Health   Financial Resource Strain: Low Risk  (04/21/2024)   Overall Financial Resource Strain (CARDIA)    Difficulty of Paying Living Expenses: Not hard at all  Food Insecurity: No Food Insecurity (04/21/2024)   Hunger Vital Sign    Worried About Running Out of Food in the Last Year: Never true    Ran Out of Food in the Last Year: Never true  Transportation Needs: No Transportation Needs (04/21/2024)   PRAPARE - Administrator, Civil Service (Medical): No    Lack of Transportation (Non-Medical): No  Physical Activity: Inactive (04/21/2024)   Exercise Vital Sign    Days of Exercise per Week: 0 days    Minutes of Exercise per Session: 0 min  Stress: No Stress Concern Present (04/21/2024)   [employer] of Occupational Health - Occupational Stress Questionnaire    Feeling of Stress: Not at all  Social Connections: Socially Integrated (04/21/2024)   Social Connection and Isolation Panel    Frequency of Communication with Friends and Family: More than three times a week    Frequency of Social Gatherings with Friends and Family: More than three times a week    Attends Religious Services: More than 4 times per year    Active Member of [employer] or Organizations: Yes    Attends Engineer, Structural: More than 4 times per year    Marital Status: Married  Catering Manager Violence: Not At Risk (04/21/2024)   Humiliation, Afraid, Rape, and Kick questionnaire    Fear of Current or Ex-Partner: No    Emotionally Abused: No    Physically Abused: No    Sexually Abused: No    Outpatient Medications Prior to Visit  Medication Sig Dispense Refill   alendronate  (FOSAMAX ) 70 MG tablet TAKE 1 TABLET(70 MG) BY MOUTH EVERY 7 DAYS WITH A FULL GLASS OF WATER AND ON AN EMPTY STOMACH 12 tablet 4   aspirin  EC 81 MG tablet Take 2 tablets (162 mg total) by mouth  daily. Swallow whole. 30 tablet 12   atorvastatin  (LIPITOR) 40 MG tablet TAKE 1 TABLET(40 MG) BY MOUTH DAILY 90 tablet 1   Cholecalciferol (VITAMIN D3) 10 MCG (400 UNIT) CAPS Take by mouth.     Iron , Ferrous Sulfate , 325 (65 Fe) MG TABS Take 325 mg by mouth every other day. 30 tablet  lisinopril  (ZESTRIL ) 5 MG tablet Take 1 tablet (5 mg total) by mouth daily. 90 tablet 0   methimazole  (TAPAZOLE ) 5 MG tablet Take 1 tablet (5 mg total) by mouth daily. 30 tablet 3   metoprolol  succinate (TOPROL -XL) 50 MG 24 hr tablet TAKE 1 TABLET(50 MG) BY MOUTH DAILY WITH OR IMMEDIATELY FOLLOWING A MEAL 90 tablet 1   nitroGLYCERIN  (NITROSTAT ) 0.4 MG SL tablet Place 1 tablet (0.4 mg total) under the tongue every 5 (five) minutes as needed. 30 tablet 0   omeprazole  (PRILOSEC) 40 MG capsule Take 1 capsule (40 mg total) by mouth daily. 90 capsule 0   potassium chloride  (KLOR-CON  10) 10 MEQ tablet Take 2 tablets (20 mEq total) by mouth daily for 7 days. 14 tablet 0   ondansetron  (ZOFRAN ) 4 MG tablet Take 1 tablet (4 mg total) by mouth every 8 (eight) hours as needed for nausea or vomiting. Take 1 tablet 30 minutes prior to your prep. 5 tablet 0   No facility-administered medications prior to visit.    No Known Allergies  ROS See HPI    Objective:    Physical Exam Constitutional:      General: She is not in acute distress.    Appearance: Normal appearance. She is well-developed.  HENT:     Head: Normocephalic and atraumatic.     Right Ear: External ear normal.     Left Ear: External ear normal.  Eyes:     General: No scleral icterus. Neck:     Thyroid : No thyromegaly.  Cardiovascular:     Rate and Rhythm: Normal rate and regular rhythm.     Heart sounds: Normal heart sounds. No murmur heard. Pulmonary:     Effort: Pulmonary effort is normal. No respiratory distress.     Breath sounds: Normal breath sounds. No wheezing.  Musculoskeletal:     Cervical back: Neck supple.  Skin:    General: Skin is  warm and dry.  Neurological:     Mental Status: She is alert and oriented to person, place, and time.  Psychiatric:        Mood and Affect: Mood normal.        Behavior: Behavior normal.        Thought Content: Thought content normal.        Judgment: Judgment normal.      BP 126/66 (BP Location: Right Arm, Patient Position: Sitting, Cuff Size: Normal)   Pulse (!) 113   Temp 98.6 F (37 C) (Oral)   Resp 16   Ht 5' 1.5 (1.562 m)   Wt 138 lb (62.6 kg)   SpO2 98%   BMI 25.65 kg/m  Wt Readings from Last 3 Encounters:  07/27/24 138 lb (62.6 kg)  06/03/24 166 lb (75.3 kg)  05/13/24 154 lb (69.9 kg)       Assessment & Plan:   Problem List Items Addressed This Visit   None Visit Diagnoses       Nausea and vomiting, unspecified vomiting type    -  Primary   Relevant Medications   ondansetron  (ZOFRAN ) 4 MG tablet   Other Relevant Orders   CT ABDOMEN PELVIS W CONTRAST   Comp Met (CMET)   CBC with Differential/Platelet   Lipase      Chronic nausea and vomiting exacerbated by solid food. - Ordered abdominal CT scan to further evaluate. - Ordered baseline labs including kidney function. - Prescribed Zofran  prn for nausea. - Advised Ensure for nutritional support. - Instructed ER  visit if abdominal pain develops or unable to keep down liquids. - Schedule follow-up visit in two weeks. Assessment & Plan     I am having Ruth Jensen maintain her Vitamin D3, Iron  (Ferrous Sulfate ), aspirin  EC, nitroGLYCERIN , metoprolol  succinate, atorvastatin , methimazole , alendronate , potassium chloride , omeprazole , lisinopril , and ondansetron .  Meds ordered this encounter  Medications   ondansetron  (ZOFRAN ) 4 MG tablet    Sig: Take 1 tablet (4 mg total) by mouth every 8 (eight) hours as needed for nausea or vomiting. Take 1 tablet 30 minutes prior to your prep.    Dispense:  20 tablet    Refill:  0    Supervising Provider:   DOMENICA BLACKBIRD A [[id number]]

## 2024-07-28 ENCOUNTER — Ambulatory Visit (HOSPITAL_BASED_OUTPATIENT_CLINIC_OR_DEPARTMENT_OTHER)
Admission: RE | Admit: 2024-07-28 | Discharge: 2024-07-28 | Disposition: A | Source: Ambulatory Visit | Attending: Family | Admitting: Family

## 2024-07-28 DIAGNOSIS — Z90710 Acquired absence of both cervix and uterus: Secondary | ICD-10-CM | POA: Diagnosis not present

## 2024-07-28 DIAGNOSIS — R112 Nausea with vomiting, unspecified: Secondary | ICD-10-CM | POA: Diagnosis not present

## 2024-07-28 MED ORDER — IOHEXOL 300 MG/ML  SOLN
75.0000 mL | Freq: Once | INTRAMUSCULAR | Status: AC | PRN
  Administered 2024-07-28: 75 mL via INTRAVENOUS

## 2024-07-29 ENCOUNTER — Ambulatory Visit: Payer: Self-pay | Admitting: Family

## 2024-07-29 ENCOUNTER — Telehealth: Payer: Self-pay

## 2024-07-29 NOTE — Telephone Encounter (Signed)
 Patient aware that she is scheduled to follow up with Deanna May, NP on 08-05-24 at 8:30am.  Patient agreed to plan and verbalized understanding.  No further questions or concerns.

## 2024-07-29 NOTE — Telephone Encounter (Signed)
-----   Message from Sandor LULLA Flatter sent at 07/29/2024 12:06 PM EST ----- Ruth Jensen,  No problem, we will get her in.  Nat, can you please schedule OV with me or Deanna or Alan, whoever has the soonest availability for evaluation of continued upper GI symptoms and weight loss.  Thanks. ----- Message ----- From: Ruth Setter, NP Sent: 07/29/2024   9:22 AM EST To: Sandor Flatter LULLA, DO  Dear Dr. Flatter,  I saw this mutual pt earlier this week with c/o nausea, vomiting, inability to keep down solids. This has apparently been going on for several months. She has lost 28 pounds in the last 2 months.  CT abd/pelvis is unremarkable.  She does have hypercalcemia which I am further evaluating.  Could your team please get her worked in soon for evaluation?   Thank you,  Jensen

## 2024-07-29 NOTE — Telephone Encounter (Signed)
 Good news- no concerning findings on your CT of your abdomen/pelvis. I am going to reach out to her Gastroenterologist to see if they can get her back in with them for further evaluation of her nausea/stomach concerns.  Her calcium  level is elevated and it is slowly rising the last few times that we have checked it.  Please discontinue any calcium  supplements if she is taking them.

## 2024-07-30 ENCOUNTER — Encounter: Payer: Self-pay | Admitting: Cardiology

## 2024-07-30 ENCOUNTER — Ambulatory Visit: Attending: Cardiology | Admitting: Cardiology

## 2024-07-30 VITALS — BP 136/68 | HR 98 | Ht 61.0 in | Wt 137.2 lb

## 2024-07-30 DIAGNOSIS — I10 Essential (primary) hypertension: Secondary | ICD-10-CM

## 2024-07-30 DIAGNOSIS — E782 Mixed hyperlipidemia: Secondary | ICD-10-CM | POA: Diagnosis not present

## 2024-07-30 DIAGNOSIS — I493 Ventricular premature depolarization: Secondary | ICD-10-CM

## 2024-07-30 MED ORDER — METOPROLOL SUCCINATE ER 50 MG PO TB24: 50.0000 mg | ORAL_TABLET | Freq: Every day | ORAL | 3 refills | Status: AC

## 2024-07-30 MED ORDER — ATORVASTATIN CALCIUM 40 MG PO TABS: 40.0000 mg | ORAL_TABLET | Freq: Every day | ORAL | 3 refills | Status: DC

## 2024-07-30 MED ORDER — LISINOPRIL 5 MG PO TABS: 5.0000 mg | ORAL_TABLET | Freq: Every day | ORAL | 3 refills | Status: AC

## 2024-07-30 NOTE — Addendum Note (Signed)
 Addended by: MELIDA ROLIN HERO on: 07/30/2024 03:53 PM   Modules accepted: Orders

## 2024-07-30 NOTE — Patient Instructions (Signed)

## 2024-07-30 NOTE — Progress Notes (Signed)
 Cardiology Office Note:    Date:  07/30/2024   ID:  Ruth Jensen, DOB 09/02/1950, MRN [id number]  PCP:  Daryl Setter, NP  Cardiologist:  Torrence Branagan, DO  Electrophysiologist:  None   Referring MD: Daryl Setter, NP    I am doing ok   History of Present Illness:    Ruth Jensen is a 73 y.o. female with a hx of pulmonary embolism and DVT on Xarelto , depressed LVEF 40 to 45% which had recovered in 2023 was 60-65%, nonischemic cardiomyopathy heart catheterization in 2020 showed evidence of no coronary artery disease.  Her visit with me was 10/2022 at that time she was hypertensive and I adjusted her medications. She is here today for a follow up visit.  She is with her husband. Since her visit with me she has had a great deal of unexplained weight loss. This is being worked out.    Past Medical History:  Diagnosis Date   Abnormal echocardiogram 07/22/2019   Acute deep vein thrombosis (DVT) of popliteal vein of right lower extremity (HCC) 07/01/2019   Arthritis    Chest pain 07/22/2019   Depressed left ventricular ejection fraction 07/22/2019   Diastolic dysfunction    DVT (deep venous thrombosis) (HCC) 07/01/2019   GERD (gastroesophageal reflux disease)    History of DVT (deep vein thrombosis) 07/01/2019   Hypertension    Hyperthyroidism    Lipid screening 07/22/2019   Obesity (BMI 30-39.9) 07/22/2019   Osteoporosis 11/18/2019   Single subsegmental pulmonary embolism without acute cor pulmonale (HCC) 07/01/2019   Thyroid  disease     Past Surgical History:  Procedure Laterality Date   ABDOMINAL HYSTERECTOMY  2010   due to fibroids   BREAST BIOPSY Left    BREAST CYST EXCISION Left    COLONOSCOPY     [facility]. Late 2000's    LEFT HEART CATH AND CORONARY ANGIOGRAPHY N/A 08/03/2019   Procedure: LEFT HEART CATH AND CORONARY ANGIOGRAPHY;  Surgeon: Court Dorn PARAS, MD;  Location: [facility];  Service: Cardiovascular;  Laterality: N/A;   mitral  valve regurg  2023   moderat per 2D echo   OOPHORECTOMY      Current Medications: Current Meds  Medication Sig   alendronate  (FOSAMAX ) 70 MG tablet TAKE 1 TABLET(70 MG) BY MOUTH EVERY 7 DAYS WITH A FULL GLASS OF WATER AND ON AN EMPTY STOMACH   aspirin  EC 81 MG tablet Take 2 tablets (162 mg total) by mouth daily. Swallow whole.   atorvastatin  (LIPITOR) 40 MG tablet TAKE 1 TABLET(40 MG) BY MOUTH DAILY   Cholecalciferol (VITAMIN D3) 10 MCG (400 UNIT) CAPS Take by mouth.   Iron , Ferrous Sulfate , 325 (65 Fe) MG TABS Take 325 mg by mouth every other day.   lisinopril  (ZESTRIL ) 5 MG tablet Take 1 tablet (5 mg total) by mouth daily.   methimazole  (TAPAZOLE ) 5 MG tablet Take 1 tablet (5 mg total) by mouth daily.   metoprolol  succinate (TOPROL -XL) 50 MG 24 hr tablet Take 1 tablet (50 mg total) by mouth daily. Take with or immediately following a meal.   nitroGLYCERIN  (NITROSTAT ) 0.4 MG SL tablet Place 1 tablet (0.4 mg total) under the tongue every 5 (five) minutes as needed.   omeprazole  (PRILOSEC) 40 MG capsule Take 1 capsule (40 mg total) by mouth daily.   ondansetron  (ZOFRAN ) 4 MG tablet Take 1 tablet (4 mg total) by mouth every 8 (eight) hours as needed for nausea or vomiting. Take 1 tablet 30  minutes prior to your prep.   potassium chloride  (KLOR-CON  10) 10 MEQ tablet Take 2 tablets (20 mEq total) by mouth daily for 7 days.     Allergies:   Patient has no known allergies.   Social History   Socioeconomic History   Marital status: Married    Spouse name: Jama   Number of children: 2   Years of education: Not on file   Highest education level: Not on file  Occupational History   Occupation: retired   Tobacco Use   Smoking status: Never   Smokeless tobacco: Never  Vaping Use   Vaping status: Never Used  Substance and Sexual Activity   Alcohol use: Not Currently   Drug use: Never   Sexual activity: Not Currently  Other Topics Concern   Not on file  Social History Narrative    Married   2 children (son and daughter) live locally   4 grandchildren   Retired   Used to work in investment banker, 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   No pets   Enjoys spending time with grandchildren, baseball games, walking   Social Drivers of Health   Financial Resource Strain: Low Risk  (04/21/2024)   Overall Financial Resource Strain (CARDIA)    Difficulty of Paying Living Expenses: Not hard at all  Food Insecurity: No Food Insecurity (04/21/2024)   Hunger Vital Sign    Worried About Running Out of Food in the Last Year: Never true    Ran Out of Food in the Last Year: Never true  Transportation Needs: No Transportation Needs (04/21/2024)   PRAPARE - Administrator, Civil Service (Medical): No    Lack of Transportation (Non-Medical): No  Physical Activity: Inactive (04/21/2024)   Exercise Vital Sign    Days of Exercise per Week: 0 days    Minutes of Exercise per Session: 0 min  Stress: No Stress Concern Present (04/21/2024)   [employer] of Occupational Health - Occupational Stress Questionnaire    Feeling of Stress: Not at all  Social Connections: Socially Integrated (04/21/2024)   Social Connection and Isolation Panel    Frequency of Communication with Friends and Family: More than three times a week    Frequency of Social Gatherings with Friends and Family: More than three times a week    Attends Religious Services: More than 4 times per year    Active Member of [employer] or Organizations: Yes    Attends Engineer, Structural: More than 4 times per year    Marital Status: Married     Family History: The patient's family history includes Asthma in her paternal grandfather; CAD in her sister; CVA in her mother; Dementia in her father; Hypertension in her daughter, father, mother, sister, sister, sister, and son; Parkinson's disease in her sister; Prostate cancer in her father; Skin cancer in her sister. There is no history of Colon cancer, Esophageal cancer, Rectal cancer, or  Stomach cancer.  ROS:   Review of Systems  Constitution: Negative for decreased appetite, fever and weight gain.  HENT: Negative for congestion, ear discharge, hoarse voice and sore throat.   Eyes: Negative for discharge, redness, vision loss in right eye and visual halos.  Cardiovascular: Negative for chest pain, dyspnea on exertion, leg swelling, orthopnea and palpitations.  Respiratory: Negative for cough, hemoptysis, shortness of breath and snoring.   Endocrine: Negative for heat intolerance and polyphagia.  Hematologic/Lymphatic: Negative for bleeding problem. Does not bruise/bleed easily.  Skin: Negative for flushing, nail changes, rash and  suspicious lesions.  Musculoskeletal: Negative for arthritis, joint pain, muscle cramps, myalgias, neck pain and stiffness.  Gastrointestinal: Negative for abdominal pain, bowel incontinence, diarrhea and excessive appetite.  Genitourinary: Negative for decreased libido, genital sores and incomplete emptying.  Neurological: Negative for brief paralysis, focal weakness, headaches and loss of balance.  Psychiatric/Behavioral: Negative for altered mental status, depression and suicidal ideas.  Allergic/Immunologic: Negative for HIV exposure and persistent infections.    EKGs/Labs/Other Studies Reviewed:    The following studies were reviewed today:   EKG:  The ekg ordered today demonstrates   May 15, 2022 IMPRESSIONS   1. Left ventricular ejection fraction, by estimation, is 60 to 65%. The  left ventricle has normal function. The left ventricle has no regional  wall motion abnormalities. Left ventricular diastolic parameters are  consistent with Grade I diastolic  dysfunction (impaired relaxation).   2. Right ventricular systolic function is normal. The right ventricular  size is normal. There is normal pulmonary artery systolic pressure.   3. Left atrial size was moderately dilated.   4. The mitral valve is normal in structure. Mild  to moderate mitral valve  regurgitation. No evidence of mitral stenosis.   5. The aortic valve is normal in structure. Aortic valve regurgitation is  not visualized. No aortic stenosis is present.   6. The inferior vena cava is normal in size with greater than 50%  respiratory variability, suggesting right atrial pressure of 3 mmHg.SABRA     Recent Labs: 02/05/2024: TSH <0.01 05/13/2024: Magnesium 1.5 07/27/2024: ALT 19; BUN 12; Creatinine, Ser 0.40; Hemoglobin 11.7; Platelets 285; Potassium 3.7; Sodium 142  Recent Lipid Panel    Component Value Date/Time   CHOL 153 02/05/2024 1332   CHOL 234 (H) 07/22/2019 1418   TRIG 75 02/05/2024 1332   HDL 44 (L) 02/05/2024 1332   HDL 51 07/22/2019 1418   CHOLHDL 3.5 02/05/2024 1332   VLDL 17.8 01/16/2023 1043   LDLCALC 93 02/05/2024 1332    Physical Exam:    VS:  BP 136/68 (BP Location: Right Arm, Patient Position: Sitting, Cuff Size: Normal)   Pulse 98   Ht 5' 1 (1.549 m)   Wt 137 lb 3.2 oz (62.2 kg)   SpO2 95%   BMI 25.92 kg/m     Wt Readings from Last 3 Encounters:  07/30/24 137 lb 3.2 oz (62.2 kg)  07/27/24 138 lb (62.6 kg)  06/03/24 166 lb (75.3 kg)     GEN: Well nourished, well developed in no acute distress HEENT: Normal NECK: No JVD; No carotid bruits LYMPHATICS: No lymphadenopathy CARDIAC: S1S2 noted,RRR, no murmurs, rubs, gallops RESPIRATORY:  Clear to auscultation without rales, wheezing or rhonchi  ABDOMEN: Soft, non-tender, non-distended, +bowel sounds, no guarding. EXTREMITIES: No edema, No cyanosis, no clubbing MUSCULOSKELETAL:  No deformity  SKIN: Warm and dry NEUROLOGIC:  Alert and oriented x 3, non-focal PSYCHIATRIC:  Normal affect, good insight  ASSESSMENT:    1. Essential hypertension   2. Frequent PVCs   3. Mixed hyperlipidemia    PLAN:    Hypertension - blood pressure is borderline. Please refill her antihypertensive medication: Lisinopril  and toprol   PVC: usually asymptomatic, on toprol  will  refill.  Hyperlipidemia - continue with current statin medication.  The patient is in agreement with the above plan. The patient left the office in stable condition.  The patient will follow up in   Medication Adjustments/Labs and Tests Ordered: Current medicines are reviewed at length with the patient today.  Concerns regarding medicines  are outlined above.  Orders Placed This Encounter  Procedures   EKG 12-Lead   No orders of the defined types were placed in this encounter.   There are no Patient Instructions on file for this visit.   Adopting a Healthy Lifestyle.  Know what a healthy weight is for you (roughly BMI <25) and aim to maintain this   Aim for 7+ servings of fruits and vegetables daily   65-80+ fluid ounces of water or unsweet tea for healthy kidneys   Limit to max 1 drink of alcohol per day; avoid smoking/tobacco   Limit animal fats in diet for cholesterol and heart health - choose grass fed whenever available   Avoid highly processed foods, and foods high in saturated/trans fats   Aim for low stress - take time to unwind and care for your mental health   Aim for 150 min of moderate intensity exercise weekly for heart health, and weights twice weekly for bone health   Aim for 7-9 hours of sleep daily   When it comes to diets, agreement about the perfect plan isnt easy to find, even among the experts. Experts at the [facility] of [employer] developed an idea known as the Healthy Eating Plate. Just imagine a plate divided into logical, healthy portions.   The emphasis is on diet quality:   Load up on vegetables and fruits - one-half of your plate: Aim for color and variety, and remember that potatoes dont count.   Go for whole grains - one-quarter of your plate: Whole wheat, barley, wheat berries, quinoa, oats, brown rice, and foods made with them. If you want pasta, go with whole wheat pasta.   Protein power - one-quarter of your plate: Fish,  chicken, beans, and nuts are all healthy, versatile protein sources. Limit red meat.   The diet, however, does go beyond the plate, offering a few other suggestions.   Use healthy plant oils, such as olive, canola, soy, corn, sunflower and peanut. Check the labels, and avoid partially hydrogenated oil, which have unhealthy trans fats.   If youre thirsty, drink water. Coffee and tea are good in moderation, but skip sugary drinks and limit milk and dairy products to one or two daily servings.   The type of carbohydrate in the diet is more important than the amount. Some sources of carbohydrates, such as vegetables, fruits, whole grains, and beans-are healthier than others.   Finally, stay active  Signed, Alyss Granato, DO  07/30/2024 1:51 PM    [facility]

## 2024-07-31 ENCOUNTER — Other Ambulatory Visit

## 2024-08-05 ENCOUNTER — Ambulatory Visit: Admitting: Gastroenterology

## 2024-08-06 LAB — CALCIUM, IONIZED: Calcium, Ion: 5.6 mg/dL — ABNORMAL HIGH (ref 4.7–5.5)

## 2024-08-06 LAB — PROTEIN ELECTROPHORESIS, SERUM
Albumin ELP: 3.4 g/dL — ABNORMAL LOW (ref 3.8–4.8)
Alpha 1: 0.3 g/dL (ref 0.2–0.3)
Alpha 2: 0.6 g/dL (ref 0.5–0.9)
Beta 2: 0.5 g/dL (ref 0.2–0.5)
Beta Globulin: 0.5 g/dL (ref 0.4–0.6)
Gamma Globulin: 1.2 g/dL (ref 0.8–1.7)
Total Protein: 6.5 g/dL (ref 6.1–8.1)

## 2024-08-06 LAB — PARATHYROID HORMONE, INTACT (NO CA): PTH: 57 pg/mL (ref 16–77)

## 2024-08-07 ENCOUNTER — Ambulatory Visit

## 2024-08-10 ENCOUNTER — Ambulatory Visit: Payer: Self-pay

## 2024-08-24 ENCOUNTER — Other Ambulatory Visit: Payer: Self-pay | Admitting: Family

## 2024-09-08 ENCOUNTER — Ambulatory Visit: Admitting: *Deleted

## 2024-09-08 DIAGNOSIS — E538 Deficiency of other specified B group vitamins: Secondary | ICD-10-CM | POA: Diagnosis not present

## 2024-09-08 MED ORDER — CYANOCOBALAMIN 1000 MCG/ML IJ SOLN
1000.0000 ug | Freq: Once | INTRAMUSCULAR | Status: AC
  Administered 2024-09-08: 1000 ug via INTRAMUSCULAR

## 2024-09-08 NOTE — Progress Notes (Signed)
 Pt in for monthly b12 injection per pcp.  Last B12 injection:07/09/24   Last B12 level:  1,500 on 05/13/24    B12 1000mcg given IM LD, and pt tolerated injection well.   Next B12 injection scheduled for: 10/09/24

## 2024-09-09 ENCOUNTER — Other Ambulatory Visit: Payer: Self-pay | Admitting: Family

## 2024-09-09 ENCOUNTER — Ambulatory Visit

## 2024-09-09 NOTE — Telephone Encounter (Signed)
 Copied from CRM [id number]. Topic: Clinical - Medication Refill >> Sep 09, 2024  9:54 AM Wess RAMAN wrote: Medication: potassium chloride  (KLOR-CON ) 10 MEQ tablet   Has the patient contacted their pharmacy? Yes (Agent: If no, request that the patient contact the pharmacy for the refill. If patient does not wish to contact the pharmacy document the reason why and proceed with request.) (Agent: If yes, when and what did the pharmacy advise?)  This is the patient's preferred pharmacy:   [facility] [place] [facility] [address] [place] [address] Phone: [phone] Fax: [phone]  Is this the correct pharmacy for this prescription? Yes If no, delete pharmacy and type the correct one.   Has the prescription been filled recently? Yes  Is the patient out of the medication? Yes  Has the patient been seen for an appointment in the last year OR does the patient have an upcoming appointment? Yes  Can we respond through MyChart? No  Agent: Please be advised that Rx refills may take up to 3 business days. We ask that you follow-up with your pharmacy.

## 2024-09-10 ENCOUNTER — Other Ambulatory Visit (HOSPITAL_BASED_OUTPATIENT_CLINIC_OR_DEPARTMENT_OTHER): Payer: Self-pay

## 2024-09-10 ENCOUNTER — Ambulatory Visit: Admitting: Gastroenterology

## 2024-09-10 MED ORDER — POTASSIUM CHLORIDE ER 10 MEQ PO TBCR
10.0000 meq | EXTENDED_RELEASE_TABLET | Freq: Every day | ORAL | 0 refills | Status: AC
  Filled 2024-09-10: qty 90, 90d supply, fill #0

## 2024-09-10 NOTE — Progress Notes (Unsigned)
 "  Chief Complaint:Heme positive stool  Primary GI Doctor:Dr. 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 San for Iron  deficiency anemia and b 12 deficiency anemia. Capsule endoscopy and celiac panel ordered.  Endoscopic history: -EGD (01/12/2020, Dr. San): Hill Grade 3, moderate non-H. pylori gastritis with focal intestinal metaplasia, benign gastric polyp with intestinal metaplasia, normal duodenum (path: Slight increase IELs without villous flattening) -Colonoscopy (01/12/2020, Dr. San): 2 mm benign cecal polyp, melanosis coli, segmental nonspecific inflammation, internal hemorrhoids.  No repeat for screening due to age --VCE (04/06/20) :  - Complete study with adequate prep - First gastric image at 00:00:08 - First duodenal image at 00:33:49 - First cecal image at 01:46:07 - Small bowel transit time: 01:12:17 Findings: - Rapid small bowel transit - Single tiny, non-bleeding AVM at 1 hr 36 min, which is in the distal small bowel, approx 10 mins from the cecum. Otherwise normal study.  06/03/2024 colonoscopy: Perianal skin tags found on perianal exam. The entire examined colon is normal. Non- bleeding internal hemorrhoids.  06/03/24 EGD: Normal esophagus.  Antral gastritis. Biopsied. Gastroesophageal flap valve classified as Hill Grade III ( minimal fold, loose to endoscope, hiatal hernia likely) . Normal gastric fundus and gastric body. Biopsies were taken throughout the stomach per gastric intestinal mapping ( GIM) protocol.  Normal examined duodenum. Biopsied. - The  biopsies from the duodenum show mildly increased intraepithelial lymphocytes but no villous blunting.  This is a nonspecific mild inflammatory change. - Biopsies were taken throughout the stomach and show areas of mild to moderate chronic inactive gastritis but no Helicobacter pylori infection.  There was intestinal metaplasia noted on biopsies taken from the gastric body and gastric fundus, and no metaplasia from the antral biopsies.   07/28/24 CTAP IMPRESSION: 1. No acute abdominal/pelvic findings, mass lesions or adenopathy. 2. Status post hysterectomy. 3. Aortic atherosclerosis.    Interval History Patient last seen in the GI office on 04/17/2024 by myself.      Patient presents for evaluation of positive Hemoccult, accompanied by her daughter.During routine lab work it was discovered patient was mildly anemia, so IFOB was ordered which came back positive.  Patient reports her stools have been dark since she has been on iron  supplementation every other day. Patient reports she has bowel movements most days. She takes OTC miralax as needed.  Does report she has been more fatigued and finds it harder to get out of bed in the morning.  Patient also admits to poor appetite and not feeling hungry.  Patient has lost about 4 pounds since April.  Her daughter states she has to encourage her to eat most days.  Patient taken baby aspirin  81 mg p.o. daily.  Patient also has B12 deficiency and gets B12 injections. No recent hematology visit.    Her gastroesophageal reflux disease is managed with omeprazole  every other day, which she finds sufficient. Denies dysphagia. Denies nausea or vomiting.     No new surgical history.  No new family history.  Wt Readings from Last 3 Encounters:  07/30/24 137 lb 3.2 oz (62.2 kg)  07/27/24 138 lb (  62.6 kg)  06/03/24 166 lb (75.3 kg)    Past Medical History:  Diagnosis Date   Abnormal echocardiogram 07/22/2019   Acute deep vein thrombosis (DVT) of popliteal  vein of right lower extremity (HCC) 07/01/2019   Arthritis    Chest pain 07/22/2019   Depressed left ventricular ejection fraction 07/22/2019   Diastolic dysfunction    DVT (deep venous thrombosis) (HCC) 07/01/2019   GERD (gastroesophageal reflux disease)    History of DVT (deep vein thrombosis) 07/01/2019   Hypertension    Hyperthyroidism    Lipid screening 07/22/2019   Obesity (BMI 30-39.9) 07/22/2019   Osteoporosis 11/18/2019   Single subsegmental pulmonary embolism without acute cor pulmonale (HCC) 07/01/2019   Thyroid  disease     Past Surgical History:  Procedure Laterality Date   ABDOMINAL HYSTERECTOMY  2010   due to fibroids   BREAST BIOPSY Left    BREAST CYST EXCISION Left    COLONOSCOPY     [facility]. Late 2000's    LEFT HEART CATH AND CORONARY ANGIOGRAPHY N/A 08/03/2019   Procedure: LEFT HEART CATH AND CORONARY ANGIOGRAPHY;  Surgeon: Court Dorn PARAS, MD;  Location: [facility] INVASIVE CV LAB;  Service: Cardiovascular;  Laterality: N/A;   mitral valve regurg  2023   moderat per 2D echo   OOPHORECTOMY      Current Outpatient Medications  Medication Sig Dispense Refill   alendronate  (FOSAMAX ) 70 MG tablet TAKE 1 TABLET(70 MG) BY MOUTH EVERY 7 DAYS WITH A FULL GLASS OF WATER AND ON AN EMPTY STOMACH 12 tablet 4   aspirin  EC 81 MG tablet Take 2 tablets (162 mg total) by mouth daily. Swallow whole. 30 tablet 12   atorvastatin  (LIPITOR) 40 MG tablet Take 1 tablet (40 mg total) by mouth daily. 90 tablet 0   Cholecalciferol (VITAMIN D3) 10 MCG (400 UNIT) CAPS Take by mouth.     Iron , Ferrous Sulfate , 325 (65 Fe) MG TABS Take 325 mg by mouth every other day. 30 tablet    lisinopril  (ZESTRIL ) 5 MG tablet Take 1 tablet (5 mg total) by mouth daily. 90 tablet 3   methimazole  (TAPAZOLE ) 5 MG tablet Take 1 tablet (5 mg total) by mouth daily. 30 tablet 3   metoprolol  succinate (TOPROL -XL) 50 MG 24 hr tablet Take 1 tablet (50 mg total) by mouth daily. Take with or immediately following  a meal. 90 tablet 3   nitroGLYCERIN  (NITROSTAT ) 0.4 MG SL tablet Place 1 tablet (0.4 mg total) under the tongue every 5 (five) minutes as needed. 30 tablet 0   omeprazole  (PRILOSEC) 40 MG capsule Take 1 capsule (40 mg total) by mouth daily. 90 capsule 0   ondansetron  (ZOFRAN ) 4 MG tablet Take 1 tablet (4 mg total) by mouth every 8 (eight) hours as needed for nausea or vomiting. Take 1 tablet 30 minutes prior to your prep. 20 tablet 0   potassium chloride  (KLOR-CON ) 10 MEQ tablet Take 1 tablet (10 mEq total) by mouth daily. 90 tablet 0   No current facility-administered medications for this visit.    Allergies as of 09/10/2024   (No Known Allergies)    Family History  Problem Relation Age of Onset   Hypertension Mother    CVA Mother        hemiparesis   Hypertension Father    Dementia Father    Prostate cancer Father    Hypertension Sister    CAD Sister        scheduled for pacemaker  Hypertension Sister    Skin cancer Sister        melanoma died at age 63   Hypertension Sister        ? PFO repair   Parkinson's disease Sister    Asthma Paternal Grandfather    Hypertension Daughter    Hypertension Son        pacemaker   Colon cancer Neg Hx    Esophageal cancer Neg Hx    Rectal cancer Neg Hx    Stomach cancer Neg Hx     Review of Systems:    Constitutional: No weight loss, fever, chills, weakness or fatigue HEENT: Eyes: No change in vision               Ears, Nose, Throat:  No change in hearing or congestion Skin: No rash or itching Cardiovascular: No chest pain, chest pressure or palpitations   Respiratory: No SOB or cough Gastrointestinal: See HPI and otherwise negative Genitourinary: No dysuria or change in urinary frequency Neurological: No headache, dizziness or syncope Musculoskeletal: No new muscle or joint pain Hematologic: No bleeding or bruising Psychiatric: No history of depression or anxiety    Physical Exam:  Vital signs: There were no vitals taken  for this visit.  Constitutional:   Pleasant  female appears to be in NAD, Well developed, Well nourished, alert and cooperative Throat: Oral cavity and pharynx without inflammation, swelling or lesion.  Respiratory: Respirations even and unlabored. Lungs clear to auscultation bilaterally.   No wheezes, crackles, or rhonchi.  Cardiovascular: Normal S1, S2. Regular rate and rhythm. No peripheral edema, cyanosis or pallor.  Gastrointestinal:  Soft, nondistended, nontender. No rebound or guarding. Normal bowel sounds. No appreciable masses or hepatomegaly. Rectal:  Not performed.  Msk:  Symmetrical without gross deformities. Without edema, no deformity or joint abnormality.  Neurologic:  Alert and  oriented x4;  grossly normal neurologically.  Skin:   Dry and intact without significant lesions or rashes.  RELEVANT LABS AND IMAGING: CBC    Latest Ref Rng & Units 07/27/2024   12:11 PM 05/13/2024    1:20 PM 02/05/2024    1:32 PM  CBC  WBC 3.8 - 10.8 Thousand/uL 6.1  5.7  6.4   Hemoglobin 11.7 - 15.5 g/dL 88.2  88.8  89.0   Hematocrit 35.9 - 46.0 % 37.5  34.4  35.5   Platelets 140 - 400 Thousand/uL 285  293.0  301      CMP     Latest Ref Rng & Units 07/31/2024    9:25 AM 07/27/2024   12:11 PM 05/18/2024   10:51 AM  CMP  Glucose 70 - 99 mg/dL  93  868   BUN 6 - 23 mg/dL  12  12   Creatinine 9.59 - 1.20 mg/dL  9.59  9.51   Sodium 864 - 145 mEq/L  142  144   Potassium 3.5 - 5.1 mEq/L  3.7  3.1   Chloride 96 - 112 mEq/L  98  104   CO2 19 - 32 mEq/L  37  32   Calcium  8.4 - 10.5 mg/dL  88.8  89.1   Total Protein 6.1 - 8.1 g/dL 6.5  6.7  6.6   Total Bilirubin 0.2 - 1.2 mg/dL  1.2  0.8   Alkaline Phos 39 - 117 U/L  95  107   AST 0 - 37 U/L  28  20   ALT 0 - 35 U/L  19  17  Lab Results  Component Value Date   TSH <0.01 (L) 02/05/2024   Lab Results  Component Value Date   IRON  67 02/05/2024   TIBC 263 02/05/2024   FERRITIN 135 02/05/2024  03/2020 celiac panel negative  8/23  echo-Left ventricular ejection fraction, by estimation, is 60 to 65%.   Assessment: No diagnosis found.    74 year old female patient who presents with anemia and positive Hemoccult.  Patient does have history of iron  deficiency and B12 anemia.  Her most recent iron  levels were normal.  Patient takes iron  supplementation every other day.  Patient also gets B12 injections. Hgb dropped from 11.2 in November to 10.9 (June).  Patient does note more fatigue, poor appetite, and weight loss. 12/2019 Colonoscopy unrevealing for etiology. EGD with moderate non-H. pylori gastritis along with normal-appearing duodenum, but duodenal biopsies with increased intraepithelial lymphocytes without villous flattening. Celiac panel neg. Small capsule showed single tiny, non-bleeding AVM at 1 hr 36 min, which is in the distal small bowel, approx 10 mins from the cecum. Otherwise normal study.  Patient on PPI therapy.  Was a discussion at last appointment about intestinal metaplasia on the endoscopy and considering repeat EGD with GIM mapping, given her current symptoms Deval Mroczka be beneficial to complete with workup.  Will discuss with Dr. San.   Plan: -Continue PPI therapy -Continue B12 and iron  repletion as already doing  -repeat upper endoscopy in 3 years for continued GIM mapping and surveillance.    Thank you for the courtesy of this consult. Please call me with any questions or concerns.   Vinny Taranto, FNP-C [facility] 09/10/2024, 1:29 PM  Cc: Daryl Setter, NP  "

## 2024-10-02 ENCOUNTER — Other Ambulatory Visit: Payer: Self-pay | Admitting: Family

## 2024-10-02 DIAGNOSIS — K219 Gastro-esophageal reflux disease without esophagitis: Secondary | ICD-10-CM

## 2024-10-09 ENCOUNTER — Ambulatory Visit

## 2025-04-27 ENCOUNTER — Ambulatory Visit
